# Patient Record
Sex: Female | Born: 1990 | Race: Black or African American | Hispanic: No | Marital: Single | Smoking: Former smoker
Health system: Southern US, Community
[De-identification: ages and names within clinical notes are randomized; demographics above are authoritative.]

## PROBLEM LIST (undated history)

## (undated) ENCOUNTER — Inpatient Hospital Stay: Payer: Self-pay

## (undated) DIAGNOSIS — Z3400 Encounter for supervision of normal first pregnancy, unspecified trimester: Secondary | ICD-10-CM

## (undated) DIAGNOSIS — D649 Anemia, unspecified: Secondary | ICD-10-CM

## (undated) DIAGNOSIS — J45909 Unspecified asthma, uncomplicated: Secondary | ICD-10-CM

## (undated) DIAGNOSIS — N6310 Unspecified lump in the right breast, unspecified quadrant: Secondary | ICD-10-CM

## (undated) HISTORY — PX: NO PAST SURGERIES: SHX2092

## (undated) HISTORY — PX: OTHER SURGICAL HISTORY: SHX169

## (undated) HISTORY — DX: Unspecified lump in the right breast, unspecified quadrant: N63.10

## (undated) HISTORY — DX: Anemia, unspecified: D64.9

---
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
## 2012-08-28 ENCOUNTER — Encounter (HOSPITAL_COMMUNITY): Payer: Self-pay | Admitting: Emergency Medicine

## 2012-08-28 ENCOUNTER — Emergency Department (HOSPITAL_COMMUNITY): Payer: No Typology Code available for payment source

## 2012-08-28 ENCOUNTER — Emergency Department (HOSPITAL_COMMUNITY)
Admission: EM | Admit: 2012-08-28 | Discharge: 2012-08-28 | Disposition: A | Discharge status: Home / Self Care | Payer: No Typology Code available for payment source | Attending: Emergency Medicine | Admitting: Emergency Medicine

## 2012-08-28 DIAGNOSIS — S41009A Unspecified open wound of unspecified shoulder, initial encounter: Secondary | ICD-10-CM | POA: Insufficient documentation

## 2012-08-28 DIAGNOSIS — S01411A Laceration without foreign body of right cheek and temporomandibular area, initial encounter: Secondary | ICD-10-CM

## 2012-08-28 DIAGNOSIS — S6990XA Unspecified injury of unspecified wrist, hand and finger(s), initial encounter: Secondary | ICD-10-CM | POA: Insufficient documentation

## 2012-08-28 DIAGNOSIS — S0990XA Unspecified injury of head, initial encounter: Secondary | ICD-10-CM | POA: Insufficient documentation

## 2012-08-28 DIAGNOSIS — J45909 Unspecified asthma, uncomplicated: Secondary | ICD-10-CM | POA: Insufficient documentation

## 2012-08-28 DIAGNOSIS — Y92410 Unspecified street and highway as the place of occurrence of the external cause: Secondary | ICD-10-CM | POA: Insufficient documentation

## 2012-08-28 DIAGNOSIS — S01409A Unspecified open wound of unspecified cheek and temporomandibular area, initial encounter: Secondary | ICD-10-CM | POA: Insufficient documentation

## 2012-08-28 DIAGNOSIS — Y9389 Activity, other specified: Secondary | ICD-10-CM | POA: Insufficient documentation

## 2012-08-28 DIAGNOSIS — S41112A Laceration without foreign body of left upper arm, initial encounter: Secondary | ICD-10-CM

## 2012-08-28 HISTORY — DX: Unspecified asthma, uncomplicated: J45.909

## 2012-08-28 LAB — URINALYSIS, ROUTINE W REFLEX MICROSCOPIC
Bilirubin Urine: NEGATIVE
Glucose, UA: NEGATIVE mg/dL
Hgb urine dipstick: NEGATIVE
Specific Gravity, Urine: 1.015 (ref 1.005–1.030)
Urobilinogen, UA: 0.2 mg/dL (ref 0.0–1.0)

## 2012-08-28 LAB — URINE MICROSCOPIC-ADD ON

## 2012-08-28 MED ORDER — LIDOCAINE-EPINEPHRINE-TETRACAINE (LET) SOLUTION
3.0000 mL | Freq: Once | NASAL | Status: AC
  Administered 2012-08-28: 3 mL via TOPICAL
  Filled 2012-08-28: qty 3

## 2012-08-28 MED ORDER — IBUPROFEN 800 MG PO TABS: 800.0000 mg | ORAL_TABLET | Freq: Three times a day (TID) | ORAL | Status: DC

## 2012-08-28 MED ORDER — IBUPROFEN 400 MG PO TABS
600.0000 mg | ORAL_TABLET | Freq: Once | ORAL | Status: AC
  Administered 2012-08-28: 600 mg via ORAL
  Filled 2012-08-28 (×2): qty 1
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
## 2012-08-29 NOTE — ED Provider Notes (Signed)
Medical screening examination/treatment/procedure(s) were performed by non-physician practitioner and as supervising physician I was immediately available for consultation/collaboration.  Aven Christen M Hershall Benkert, MD 08/29/12 0018 

## 2016-08-03 ENCOUNTER — Emergency Department
Admission: EM | Admit: 2016-08-03 | Discharge: 2016-08-03 | Disposition: A | Discharge status: Home / Self Care | Payer: Self-pay | Attending: Emergency Medicine | Admitting: Emergency Medicine

## 2016-08-03 ENCOUNTER — Encounter: Payer: Self-pay | Admitting: Emergency Medicine

## 2016-08-03 DIAGNOSIS — J020 Streptococcal pharyngitis: Secondary | ICD-10-CM | POA: Insufficient documentation

## 2016-08-03 DIAGNOSIS — J45909 Unspecified asthma, uncomplicated: Secondary | ICD-10-CM | POA: Insufficient documentation

## 2016-08-03 LAB — POCT RAPID STREP A: Streptococcus, Group A Screen (Direct): POSITIVE — AB

## 2016-08-03 MED ORDER — AMOXICILLIN 875 MG PO TABS: 875.0000 mg | ORAL_TABLET | Freq: Two times a day (BID) | ORAL | 0 refills | Status: DC

## 2016-08-03 NOTE — ED Provider Notes (Signed)
[facility] Emergency Department Provider Note ____________________________________________  Time seen: 1711  I have reviewed the triage vital signs and the nursing notes.  HISTORY  Chief Complaint  Sore Throat   HPI Crystal Randall is a 26 y.o. female is here with complaint of sore throat, body aches and headache for the last 2 days. Patient is unaware of any fever but has "felt warm". Patient denies any nausea, vomiting or diarrhea. She has not taken any over-the-counter medication for her symptoms. Patient continues to smoke cigarettes. Patient rates her pain as a 9/10.     Past Medical History:  Diagnosis Date  . Asthma     There are no active problems to display for this patient.   No past surgical history on file.  Prior to Admission medications   Medication Sig Start Date End Date Taking? Authorizing Provider  amoxicillin (AMOXIL) 875 MG tablet Take 1 tablet (875 mg total) by mouth 2 (two) times daily. 08/03/16   Johnn Hai, PA-C  medroxyPROGESTERone (DEPO-PROVERA) 150 MG/ML injection Inject 150 mg into the muscle every 3 (three) months.    [provider]    Allergies Patient has no known allergies.  No family history on file.  Social History Social History  Substance Use Topics  . Smoking status: Never Smoker  . Smokeless tobacco: Not on file  . Alcohol use No    Review of Systems  Constitutional: Subjective positive for fever. Eyes: Negative for visual changes. ENT: Positive for sore throat. Cardiovascular: Negative for chest pain. Respiratory: Negative for shortness of breath. Positive for cough. Musculoskeletal: Positive body aches. Skin: Negative for rash. Neurological: Negative for headaches, focal weakness or numbness. ____________________________________________  PHYSICAL EXAM:  VITAL SIGNS: ED Triage Vitals [08/03/16 1649]  Enc Vitals Group     BP 129/78     Pulse Rate 98     Resp 18     Temp  99.3 F (37.4 C)     Temp Source Oral     SpO2 100 %     Weight 150 lb (68 kg)     Height 5\' 2"  (1.575 m)     Head Circumference      Peak Flow      Pain Score 9     Pain Loc      Pain Edu?      Excl. in [place]?     Constitutional: Alert and oriented. Well appearing and in no distress. Head: Normocephalic and atraumatic. Eyes: Conjunctivae are normal. Ears: Canals clear. TMs intact bilaterally. Nose: Mild congestion/rhinorrhea/epistaxis. Mouth/Throat: Mucous membranes are moist. Bilateral tonsillar enlargement with cryptic tonsils. Questionable Exudative pockets present bilaterally. Neck: Supple.  Hematological/Lymphatic/Immunological: Minimal bilateral cervical lymphadenopathy. Cardiovascular: Normal rate, regular rhythm. Normal distal pulses. Respiratory: Normal respiratory effort. Occasional coarse cough. Musculoskeletal: Nontender with normal range of motion in all extremities.  Neurologic:  Normal speech and language. No gross focal neurologic deficits are appreciated. Skin:  Skin is warm, dry and intact. No rash noted. Psychiatric: Mood and affect are normal. Patient exhibits appropriate insight and judgment.   INITIAL IMPRESSION / ASSESSMENT AND PLAN / ED COURSE  The test was positive. Patient was made aware. She will take Tylenol or ibuprofen as needed for throat pain. She was given a prescription for amoxicillin 875 mg 1 twice a day for 10 days. She is aware that she is taking for the entire 10 days. She was given a coupon to obtain this medication for $9.  ____________________________________________  FINAL CLINICAL IMPRESSION(S) / ED DIAGNOSES  Final diagnoses:  Strep pharyngitis     Johnn Hai, PA-C 08/03/16 1740    Schuyler Amor, MD 08/03/16 2326

## 2016-08-03 NOTE — Discharge Instructions (Signed)
Today your strep test is positive. You are contagious for 24 hours. Take Tylenol or ibuprofen as needed for throat pain. Begin taking amoxicillin 875 mg 1 tablet twice a day for 10 days. A discount coupon was printed with the Price at [employer] being $9 for this prescription. You may take the prescription to any pharmacy that you wish

## 2016-08-03 NOTE — ED Notes (Signed)
See triage note  States she developed sore throat about 3 days ago   Pain increased today with swallowing  Throat and swollen   Low grade temp on arrival

## 2016-08-03 NOTE — ED Triage Notes (Signed)
Pt reports sore throat, body aches and headache for two days.

## 2016-09-10 ENCOUNTER — Encounter: Payer: Self-pay | Admitting: Emergency Medicine

## 2016-09-10 ENCOUNTER — Emergency Department
Admission: EM | Admit: 2016-09-10 | Discharge: 2016-09-10 | Disposition: A | Discharge status: Home / Self Care | Payer: No Typology Code available for payment source | Attending: Emergency Medicine | Admitting: Emergency Medicine

## 2016-09-10 DIAGNOSIS — Y9389 Activity, other specified: Secondary | ICD-10-CM | POA: Insufficient documentation

## 2016-09-10 DIAGNOSIS — S3992XA Unspecified injury of lower back, initial encounter: Secondary | ICD-10-CM | POA: Diagnosis present

## 2016-09-10 DIAGNOSIS — Y998 Other external cause status: Secondary | ICD-10-CM | POA: Insufficient documentation

## 2016-09-10 DIAGNOSIS — S39012A Strain of muscle, fascia and tendon of lower back, initial encounter: Secondary | ICD-10-CM | POA: Diagnosis not present

## 2016-09-10 DIAGNOSIS — Y92411 Interstate highway as the place of occurrence of the external cause: Secondary | ICD-10-CM | POA: Diagnosis not present

## 2016-09-10 DIAGNOSIS — Z79899 Other long term (current) drug therapy: Secondary | ICD-10-CM | POA: Diagnosis not present

## 2016-09-10 DIAGNOSIS — J45909 Unspecified asthma, uncomplicated: Secondary | ICD-10-CM | POA: Diagnosis not present

## 2016-09-10 MED ORDER — KETOROLAC TROMETHAMINE 10 MG PO TABS: 10.0000 mg | ORAL_TABLET | Freq: Four times a day (QID) | ORAL | 0 refills | Status: AC | PRN

## 2016-09-10 MED ORDER — KETOROLAC TROMETHAMINE 30 MG/ML IJ SOLN
30.0000 mg | Freq: Once | INTRAMUSCULAR | Status: AC
  Administered 2016-09-10: 30 mg via INTRAMUSCULAR
  Filled 2016-09-10: qty 1

## 2016-09-10 MED ORDER — CYCLOBENZAPRINE HCL 5 MG PO TABS: 5.0000 mg | ORAL_TABLET | Freq: Three times a day (TID) | ORAL | 0 refills | Status: DC | PRN
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
## 2017-01-08 ENCOUNTER — Emergency Department: Payer: Self-pay

## 2017-01-08 ENCOUNTER — Encounter: Payer: Self-pay | Admitting: Emergency Medicine

## 2017-01-08 ENCOUNTER — Emergency Department
Admission: EM | Admit: 2017-01-08 | Discharge: 2017-01-08 | Disposition: A | Discharge status: Home / Self Care | Payer: Self-pay | Attending: Emergency Medicine | Admitting: Emergency Medicine

## 2017-01-08 DIAGNOSIS — J45901 Unspecified asthma with (acute) exacerbation: Secondary | ICD-10-CM | POA: Insufficient documentation

## 2017-01-08 LAB — POCT PREGNANCY, URINE: Preg Test, Ur: NEGATIVE

## 2017-01-08 MED ORDER — ALBUTEROL SULFATE (2.5 MG/3ML) 0.083% IN NEBU
5.0000 mg | INHALATION_SOLUTION | Freq: Once | RESPIRATORY_TRACT | Status: AC
  Administered 2017-01-08: 5 mg via RESPIRATORY_TRACT
  Filled 2017-01-08: qty 6

## 2017-01-08 MED ORDER — ALBUTEROL SULFATE HFA 108 (90 BASE) MCG/ACT IN AERS: 2.0000 | INHALATION_SPRAY | Freq: Four times a day (QID) | RESPIRATORY_TRACT | 2 refills | Status: DC | PRN

## 2017-01-08 MED ORDER — IPRATROPIUM-ALBUTEROL 0.5-2.5 (3) MG/3ML IN SOLN
3.0000 mL | Freq: Once | RESPIRATORY_TRACT | Status: AC
  Administered 2017-01-08: 3 mL via RESPIRATORY_TRACT
  Filled 2017-01-08 (×2): qty 3

## 2017-01-08 NOTE — ED Provider Notes (Signed)
[facility] Emergency Department Provider Note   ____________________________________________    I have reviewed the triage vital signs and the nursing notes.   HISTORY  Chief Complaint Cough and Shortness of Breath     HPI Crystal Randall is a 26 y.o. female who presents with dry cough and mild shortness of breath. Patient has a history of asthma but has been out of her albuterol inhaler. Patient does admit to smoking. She denies fevers or chills. No recent travel. No calf pain or swelling.   Past Medical History:  Diagnosis Date  . Asthma     There are no active problems to display for this patient.   History reviewed. No pertinent surgical history.  Prior to Admission medications   Medication Sig Start Date End Date Taking? Authorizing Provider  albuterol (PROVENTIL HFA;VENTOLIN HFA) 108 (90 Base) MCG/ACT inhaler Inhale 2 puffs into the lungs every 6 (six) hours as needed for wheezing or shortness of breath. 01/08/17   Lavonia Drafts, MD  amoxicillin (AMOXIL) 875 MG tablet Take 1 tablet (875 mg total) by mouth 2 (two) times daily. 08/03/16   Johnn Hai, PA-C  cyclobenzaprine (FLEXERIL) 5 MG tablet Take 1 tablet (5 mg total) by mouth 3 (three) times daily as needed. 09/10/16   Little, Traci M, PA-C  medroxyPROGESTERone (DEPO-PROVERA) 150 MG/ML injection Inject 150 mg into the muscle every 3 (three) months.    [provider]     Allergies Patient has no known allergies.  No family history on file.  Social History Social History  Substance Use Topics  . Smoking status: Never Smoker  . Smokeless tobacco: Never Used  . Alcohol use No    Review of Systems  Constitutional: No fever/chills  ENT: No sore throat.    a. Musculoskeletal: Negative for back pain. Skin: Negative for rash. Neurological: Negative for headaches     ____________________________________________   PHYSICAL EXAM:  VITAL SIGNS: ED Triage  Vitals  Enc Vitals Group     BP 01/08/17 2109 (!) 132/95     Pulse Rate 01/08/17 2109 95     Resp 01/08/17 2109 20     Temp 01/08/17 2109 97.6 F (36.4 C)     Temp Source 01/08/17 2109 Oral     SpO2 01/08/17 2109 99 %     Weight 01/08/17 2110 69.9 kg (154 lb)     Height 01/08/17 2110 1.6 m (5\' 3" )     Head Circumference --      Peak Flow --      Pain Score 01/08/17 2109 8     Pain Loc --      Pain Edu? --      Excl. in [place]? --     Constitutional: Alert and oriented. No acute distress. Pleasant and interactive  Nose: No congestion/rhinnorhea. Mouth/Throat: Mucous membranes are moist.   Cardiovascular: Normal rate, regular rhythm.  Respiratory: Normal respiratory effort.  No retractions. Scattered mild wheezes Genitourinary: deferred Musculoskeletal: No lower extremity tenderness nor edema.   Neurologic:  Normal speech and language. No gross focal neurologic deficits are appreciated.   Skin:  Skin is warm, dry and intact. No rash noted.   ____________________________________________   LABS (all labs ordered are listed, but only abnormal results are displayed)  Labs Reviewed  POC URINE PREG, ED  POCT PREGNANCY, URINE   ____________________________________________  EKG   ____________________________________________  RADIOLOGY  Chest x-ray unremarkable ____________________________________________   PROCEDURES  Procedure(s) performed: No  Critical Care performed: No ____________________________________________   INITIAL IMPRESSION / ASSESSMENT AND PLAN / ED COURSE  Pertinent labs & imaging results that were available during my care of the patient were reviewed by me and considered in my medical decision making (see chart for details).  Patient with a history of asthma presents with mild shortness of breath, scattered wheezing.  Treated with DuoNeb 2 with significant improvement in symptoms. Chest x-ray unremarkable.  Counsel patient for greater than  3 minutes regarding smoking cessation   ____________________________________________   FINAL CLINICAL IMPRESSION(S) / ED DIAGNOSES  Final diagnoses:  Exacerbation of asthma, unspecified asthma severity, unspecified whether persistent      NEW MEDICATIONS STARTED DURING THIS VISIT:  New Prescriptions   ALBUTEROL (PROVENTIL HFA;VENTOLIN HFA) 108 (90 BASE) MCG/ACT INHALER    Inhale 2 puffs into the lungs every 6 (six) hours as needed for wheezing or shortness of breath.     Note:  This document was prepared using Dragon voice recognition software and may include unintentional dictation errors.    Lavonia Drafts, MD 01/08/17 2208

## 2017-01-08 NOTE — ED Triage Notes (Signed)
Pt reports chest pain since yesterday reports pain located in the middle of her chest feels pressure and increases trying to take a deep breath. Pt reports ran out of her asthma medication pt talks in complete sentences no distress noted

## 2017-06-03 ENCOUNTER — Encounter: Payer: Self-pay | Admitting: Medical Oncology

## 2017-06-03 ENCOUNTER — Emergency Department
Admission: EM | Admit: 2017-06-03 | Discharge: 2017-06-03 | Disposition: A | Discharge status: Home / Self Care | Payer: Self-pay | Attending: Student in an Organized Health Care Education/Training Program | Admitting: Student in an Organized Health Care Education/Training Program

## 2017-06-03 DIAGNOSIS — Z79899 Other long term (current) drug therapy: Secondary | ICD-10-CM | POA: Insufficient documentation

## 2017-06-03 DIAGNOSIS — J45909 Unspecified asthma, uncomplicated: Secondary | ICD-10-CM | POA: Insufficient documentation

## 2017-06-03 DIAGNOSIS — J020 Streptococcal pharyngitis: Secondary | ICD-10-CM | POA: Insufficient documentation

## 2017-06-03 LAB — GROUP A STREP BY PCR: GROUP A STREP BY PCR: DETECTED — AB

## 2017-06-03 MED ORDER — IBUPROFEN 800 MG PO TABS
ORAL_TABLET | ORAL | Status: AC
  Filled 2017-06-03: qty 1

## 2017-06-03 MED ORDER — ACETAMINOPHEN-CODEINE #3 300-30 MG PO TABS: 1.0000 | ORAL_TABLET | ORAL | 0 refills | Status: DC | PRN

## 2017-06-03 MED ORDER — AMOXICILLIN 500 MG PO TABS: 500.0000 mg | ORAL_TABLET | Freq: Three times a day (TID) | ORAL | 0 refills | Status: DC

## 2017-06-03 NOTE — ED Provider Notes (Signed)
[facility] Emergency Department Provider Note  ____________________________________________  Time seen: Approximately 11:29 PM  I have reviewed the triage vital signs and the nursing notes.   HISTORY  Chief Complaint Sore Throat; Generalized Body Aches; and Fever    HPI Crystal Randall is a 27 y.o. female who presents to the emergency department for treatment and evaluation of sore throat, fever, chills that began yesterday.  Sore throat is worse today.  No relief with Mucinex. Past Medical History:  Diagnosis Date  . Asthma     There are no active problems to display for this patient.   History reviewed. No pertinent surgical history.  Prior to Admission medications   Medication Sig Start Date End Date Taking? Authorizing Provider  acetaminophen-codeine (TYLENOL #3) 300-30 MG tablet Take 1 tablet by mouth every 4 (four) hours as needed for moderate pain. 06/03/17   Natoya Viscomi B, FNP  albuterol (PROVENTIL HFA;VENTOLIN HFA) 108 (90 Base) MCG/ACT inhaler Inhale 2 puffs into the lungs every 6 (six) hours as needed for wheezing or shortness of breath. 01/08/17   Lavonia Drafts, MD  amoxicillin (AMOXIL) 500 MG tablet Take 1 tablet (500 mg total) by mouth 3 (three) times daily. 06/03/17   Tashira Torre, Johnette Abraham B, FNP  cyclobenzaprine (FLEXERIL) 5 MG tablet Take 1 tablet (5 mg total) by mouth 3 (three) times daily as needed. 09/10/16   Little, Traci M, PA-C  medroxyPROGESTERone (DEPO-PROVERA) 150 MG/ML injection Inject 150 mg into the muscle every 3 (three) months.    [provider]    Allergies Patient has no known allergies.  No family history on file.  Social History Social History   Tobacco Use  . Smoking status: Never Smoker  . Smokeless tobacco: Never Used  Substance Use Topics  . Alcohol use: No  . Drug use: No    Review of Systems Constitutional: Positive for fever. Eyes: No visual changes. ENT: Positive for sore throat; negative for  difficulty swallowing. Respiratory: Denies shortness of breath. Gastrointestinal: No abdominal pain.  No nausea, no vomiting.  No diarrhea. Genitourinary: Negative for dysuria. Musculoskeletal: Negative for generalized body aches. Skin: Negative for rash. Neurological: Positive for headaches, negative focal weakness or numbness.  ____________________________________________   PHYSICAL EXAM:  VITAL SIGNS: ED Triage Vitals  Enc Vitals Group     BP 06/03/17 1802 121/74     Pulse Rate 06/03/17 1802 (!) 102     Resp 06/03/17 1802 18     Temp 06/03/17 1802 (!) 100.6 F (38.1 C)     Temp Source 06/03/17 1802 Oral     SpO2 06/03/17 1802 97 %     Weight 06/03/17 1803 150 lb (68 kg)     Height 06/03/17 1803 5\' 4"  (1.626 m)     Head Circumference --      Peak Flow --      Pain Score 06/03/17 1803 10     Pain Loc --      Pain Edu? --      Excl. in [place]? --    Constitutional: Alert and oriented. Well appearing and in no acute distress. Eyes: Conjunctivae are normal.  Head: Atraumatic. Nose: No congestion/rhinnorhea. Mouth/Throat: Mucous membranes are moist.  Oropharynx erythematous, tonsils 2+ with exudate. Uvula is midline. Neck: No stridor.  Lymphatic: Lymphadenopathy: Tender anterior cervical nodes Cardiovascular: Normal rate, regular rhythm. Good peripheral circulation. Respiratory: Normal respiratory effort. Lungs CTAB. Gastrointestinal: Soft and nontender. Musculoskeletal: No lower extremity tenderness nor edema.  Neurologic:  Normal  speech and language. No gross focal neurologic deficits are appreciated. Speech is normal. No gait instability. Skin:  Skin is warm, dry and intact. No rash noted Psychiatric: Mood and affect are normal. Speech and behavior are normal.  ____________________________________________   LABS (all labs ordered are listed, but only abnormal results are displayed)  Labs Reviewed  GROUP A STREP BY PCR - Abnormal; Notable for the following components:       Result Value   Group A Strep by PCR DETECTED (*)    All other components within normal limits   ____________________________________________  EKG  Not indicated ____________________________________________  RADIOLOGY  Not indicated ____________________________________________   PROCEDURES  Procedure(s) performed: None  Critical Care performed: No ____________________________________________   INITIAL IMPRESSION / ASSESSMENT AND PLAN / ED COURSE  27 year old female presenting to the emergency department for treatment and evaluation of sore throat.  Strep test was positive.  She will be started on amoxicillin she will be given some Tylenol 3 as well.  She was instructed to follow-up with the primary care provider for symptoms that are not improving over the next few days.  She was instructed to return to the emergency department for symptoms of change or worsen if she is unable to schedule an appointment.  Pertinent labs & imaging results that were available during my care of the patient were reviewed by me and considered in my medical decision making (see chart for details). ____________________________________________  Discharge Medication List as of 06/03/2017  7:00 PM    START taking these medications   Details  acetaminophen-codeine (TYLENOL #3) 300-30 MG tablet Take 1 tablet by mouth every 4 (four) hours as needed for moderate pain., Starting Sun 06/03/2017, Print        FINAL CLINICAL IMPRESSION(S) / ED DIAGNOSES  Final diagnoses:  Strep throat    If controlled substance prescribed during this visit, 12 month history viewed on the [place] prior to issuing an initial prescription for Schedule II or III opiod.   Note:  This document was prepared using Dragon voice recognition software and may include unintentional dictation errors.    Victorino Dike, FNP 06/03/17 2331    Merlyn Lot, MD 06/04/17 [id number]

## 2017-06-03 NOTE — ED Triage Notes (Signed)
Pt reports sore throat, fever, chills that began yesterday.

## 2017-10-26 ENCOUNTER — Other Ambulatory Visit (HOSPITAL_COMMUNITY)
Admission: RE | Admit: 2017-10-26 | Discharge: 2017-10-26 | Disposition: A | Discharge status: Home / Self Care | Payer: Self-pay | Source: Ambulatory Visit | Attending: Advanced Practice Midwife | Admitting: Advanced Practice Midwife

## 2017-10-26 ENCOUNTER — Encounter: Payer: Self-pay | Admitting: Advanced Practice Midwife

## 2017-10-26 ENCOUNTER — Ambulatory Visit (INDEPENDENT_AMBULATORY_CARE_PROVIDER_SITE_OTHER): Payer: Medicaid Other | Admitting: Advanced Practice Midwife

## 2017-10-26 VITALS — BP 108/76 | HR 83 | Wt 150.0 lb

## 2017-10-26 DIAGNOSIS — Z3400 Encounter for supervision of normal first pregnancy, unspecified trimester: Secondary | ICD-10-CM | POA: Insufficient documentation

## 2017-10-26 DIAGNOSIS — Z3A08 8 weeks gestation of pregnancy: Secondary | ICD-10-CM | POA: Diagnosis not present

## 2017-10-26 DIAGNOSIS — Z113 Encounter for screening for infections with a predominantly sexual mode of transmission: Secondary | ICD-10-CM

## 2017-10-26 DIAGNOSIS — Z1379 Encounter for other screening for genetic and chromosomal anomalies: Secondary | ICD-10-CM

## 2017-10-26 DIAGNOSIS — Z3401 Encounter for supervision of normal first pregnancy, first trimester: Secondary | ICD-10-CM

## 2017-10-26 HISTORY — DX: Encounter for supervision of normal first pregnancy, unspecified trimester: Z34.00

## 2017-10-26 LAB — OB RESULTS CONSOLE VARICELLA ZOSTER ANTIBODY, IGG: VARICELLA IGG: IMMUNE

## 2017-10-26 NOTE — Progress Notes (Signed)
New Obstetric Patient H&P    Chief Complaint: "Desires prenatal care"   History of Present Illness: Patient is a 27 y.o. G1P0 Not Hispanic or Latino female, presents with amenorrhea and positive home pregnancy test. Patient's last menstrual period was 08/31/2017 (exact date). and based on her  LMP, her EDD is Estimated Date of Delivery: 06/07/18 and her EGA is [redacted]w[redacted]d. Cycles are 6. days, regular, and occur approximately every : 28 days. Her last pap smear was 1 or 2 years ago and was no abnormalities.    She had a urine pregnancy test which was positive 2 week(s)  ago. Her last menstrual period was normal and lasted for  5 or 6 day(s). Since her LMP she claims she has experienced breast tenderness, fatigue, nausea. She denies vaginal bleeding. Her past medical history is contributory for asthma. This is her first pregnancy.  Since her LMP, she admits to the use of tobacco products  She smokes 6-7 cigarettes per day and is trying to quit She claims she has gained   3 pounds since the start of her pregnancy.  There are cats in the home in the home  no  She admits close contact with children on a regular basis  yes  She has had chicken pox in the past yes She has had Tuberculosis exposures, symptoms, or previously tested positive for TB   no Current or past history of domestic violence. no  Genetic Screening/Teratology Counseling: (Includes patient, baby's father, or anyone in either family with:)   27. Patient's age >/= 27 [facility]  no 2. Thalassemia ([place], [place], [place], or Asian background): MCV<80  no 3. Neural tube defect (meningomyelocele, spina bifida, anencephaly)  no 4. Congenital heart defect  no  5. Down syndrome  no 6. Tay-Sachs (Jewish, [place])  no 7. Canavan's Disease  no 8. Sickle cell disease or trait (African)  no  9. Hemophilia or other blood disorders  no  10. Muscular dystrophy  no  11. Cystic fibrosis  no  12. Huntington's Chorea  no  13. Mental  retardation/autism  no 14. Other inherited genetic or chromosomal disorder  no 15. Maternal metabolic disorder (DM, PKU, etc)  no 16. Patient or FOB with a child with a birth defect not listed above no  16a. Patient or FOB with a birth defect themselves no 17. Recurrent pregnancy loss, or stillbirth  no  18. Any medications since LMP other than prenatal vitamins (include vitamins, supplements, OTC meds, drugs, alcohol)  no 19. Any other genetic/environmental exposure to discuss  no  Infection History:   1. Lives with someone with TB or TB exposed  no  2. Patient or partner has history of genital herpes  no 3. Rash or viral illness since LMP  no 4. History of STI (GC, CT, HPV, syphilis, HIV)  no 5. History of recent travel :  no  Other pertinent information:  no    Review of Systems:10 point review of systems negative unless otherwise noted in HPI  Past Medical History:  Past Medical History:  Diagnosis Date  . Asthma     Past Surgical History:  History reviewed. No pertinent surgical history.  Gynecologic History: Patient's last menstrual period was 08/31/2017 (exact date).  Obstetric History: G1P0  Family History:  History reviewed. No pertinent family history.  Maternal Grandmother Breast Cancer diagnosed age 65  Social History:  Social History   Socioeconomic History  . Marital status: Single    Spouse name: Not  on file  . Number of children: Not on file  . Years of education: Not on file  . Highest education level: Not on file  Occupational History  . Not on file  Social Needs  . Financial resource strain: Not on file  . Food insecurity:    Worry: Not on file    Inability: Not on file  . Transportation needs:    Medical: Not on file    Non-medical: Not on file  Tobacco Use  . Smoking status: Current Every Day Smoker    Packs/day: 0.35    Years: 2.00    Pack years: 0.70    Types: Cigarettes  . Smokeless tobacco: Never Used  Substance and Sexual  Activity  . Alcohol use: No  . Drug use: No  . Sexual activity: Not on file  Lifestyle  . Physical activity:    Days per week: Not on file    Minutes per session: Not on file  . Stress: Not on file  Relationships  . Social connections:    Talks on phone: Not on file    Gets together: Not on file    Attends religious service: Not on file    Active member of club or organization: Not on file    Attends meetings of clubs or organizations: Not on file    Relationship status: Not on file  . Intimate partner violence:    Fear of current or ex partner: Not on file    Emotionally abused: Not on file    Physically abused: Not on file    Forced sexual activity: Not on file  Other Topics Concern  . Not on file  Social History Narrative  . Not on file    Allergies:  No Known Allergies  Medications: Prior to Admission medications   Medication Sig Start Date End Date Taking? Authorizing Provider  Prenatal Vit-Fe Fumarate-FA (PRENATAL PO) Take by mouth.   Yes [provider]    Physical Exam Vitals: Blood pressure 108/76, pulse 83, weight 150 lb (68 kg), last menstrual period 08/31/2017.  General: NAD HEENT: normocephalic, anicteric Thyroid: no enlargement, no palpable nodules Pulmonary: No increased work of breathing, CTAB Cardiovascular: RRR, distal pulses 2+ Abdomen: NABS, soft, non-tender, non-distended.  Umbilicus without lesions.  No hepatomegaly, splenomegaly or masses palpable. No evidence of hernia  Genitourinary: deferred for no concerns/PAP interval/dating scan in 1 week/shared decision making Extremities: no edema, erythema, or tenderness Neurologic: Grossly intact Psychiatric: mood appropriate, affect full   Assessment: 27 y.o. G1P0 at [redacted]w[redacted]d presenting to initiate prenatal care  Plan: 1) Avoid alcoholic beverages. 2) Patient encouraged not to smoke.  3) Discontinue the use of all non-medicinal drugs and chemicals.  4) Take prenatal vitamins daily.  5)  Nutrition, food safety (fish, cheese advisories, and high nitrite foods) and exercise discussed. 6) Hospital and practice style discussed with cross coverage system.  7) Genetic Screening, such as with 1st Trimester Screening, cell free fetal DNA, AFP testing, and Ultrasound, as well as with amniocentesis and CVS as appropriate, is discussed with patient. At the conclusion of today's visit patient requested cell free DNA genetic testing 8) Patient is asked about travel to areas at risk for the Zika virus, and counseled to avoid travel and exposure to mosquitoes or sexual partners who may have themselves been exposed to the virus. Testing is discussed, and will be ordered as appropriate.  9) Dating scan in 1 week 10) NOB labs today   Rod Can, CNM [facility] 10/26/2017, 4:02 PM

## 2017-10-26 NOTE — Patient Instructions (Signed)
Exercise During Pregnancy For people of all ages, exercise is an important part of being healthy. Exercise improves heart and lung function and helps to maintain strength, flexibility, and a healthy body weight. Exercise also boosts energy levels and elevates mood. For most women, maintaining an exercise routine throughout pregnancy is recommended. It is only on rare occasions and with certain medical conditions or pregnancy complications that women may be asked to limit or avoid exercise during pregnancy. What are some other benefits to exercising during pregnancy? Along with maintaining strength and flexibility, exercising throughout pregnancy can help to:  Keep strength in muscles that are very important during labor and childbirth.  Decrease low back pain during pregnancy.  Decrease the risk of developing gestational diabetes mellitus (GDM).  Improve blood sugar (glucose) control for women who have GDM.  Decrease the risk of developing preeclampsia. This is a serious condition that causes high blood pressure along with other symptoms, such as swelling and headaches.  Decrease the risk of cesarean delivery.  Speed up the recovery after giving birth.  How often should I exercise? Unless your health care provider gives you different instructions, you should try to exercise on most days or all days of the week. In general, try to exercise with moderate intensity for about 150 minutes per week. This can be spread out across several days, such as exercising for 30 minutes per day on 5 days of each week. You can tell that you are exercising at a moderate intensity if you have a higher heart rate and faster breathing, but you are still able to hold a conversation. What types of moderate-intensity exercise are recommended during pregnancy? There are many types of exercise that are safe for you to do during pregnancy. Unless your health care provider gives you different instructions, do a variety of  exercises that safely increase your heart and breathing (cardiopulmonary) rates and help you to build and maintain muscle strength (strength training). You should always be able to talk in full sentences while exercising during pregnancy. Some examples of exercising that is safe to do during pregnancy include:  Brisk walking or hiking.  Swimming.  Water aerobics.  Riding a stationary bike.  Strength training.  Modified yoga or Pilates. Tell your instructor that you are pregnant. Avoid overstretching and avoid lying on your back for long periods of time.  Running or jogging. Only choose this type of exercise if: ? You ran or jogged regularly before your pregnancy. ? You can run or jog and still talk in complete sentences.  What types of exercise should I not do during pregnancy? Depending on your level of fitness and whether you exercised regularly before your pregnancy, you may be advised to limit vigorous-intensity exercise during your pregnancy. You can tell that you are exercising at a vigorous intensity if you are breathing much harder and faster and cannot hold a conversation while exercising. Some examples of exercising that you should avoid during pregnancy include:  Contact sports.  Activities that place you at risk for falling on or being hit in the belly, such as downhill skiing, water skiing, surfing, rock climbing, cycling, gymnastics, and horseback riding.  Scuba diving.  Sky diving.  Yoga or Pilates in a room that is heated to extreme temperatures ("hot yoga" or "hot Pilates").  Jogging or running, unless you ran or jogged regularly before your pregnancy. While jogging or running, you should always be able to talk in full sentences. Do not run or jog so vigorously  that you are unable to have a conversation.  If you are not used to exercising at elevation (more than 6,000 feet above sea level), do not do so during your pregnancy.  When should I avoid exercising  during pregnancy? Certain medical conditions can make it unsafe to exercise during pregnancy, or they may increase your risk of miscarriage or early labor and birth. Some of these conditions include:  Some types of heart disease.  Some types of lung disease.  Placenta previa. This is when the placenta partially or completely covers the opening of the uterus (cervix).  Frequent bleeding from the vagina during your pregnancy.  Incompetent cervix. This is when your cervix does not remain as tightly closed during pregnancy as it should.  Premature labor.  Ruptured membranes. This is when the protective sac (amniotic sac) opens up and amniotic fluid leaks from your vagina.  Severely low blood count (anemia).  Preeclampsia or pregnancy-caused high blood pressure.  Carrying more than one baby (multiple gestation) and having an additional risk of early labor.  Poorly controlled diabetes.  Being severely underweight or severely overweight.  Intrauterine growth restriction. This is when your baby's growth and development during pregnancy are slower than expected.  Other medical conditions. Ask your health care provider if any apply to you.  What else should I know about exercising during pregnancy? You should take these precautions while exercising during pregnancy:  Avoid overheating. ? Wear loose-fitting, breathable clothes. ? Do not exercise in very high temperatures.  Avoid dehydration. Drink enough water before, during, and after exercise to keep your urine clear or pale yellow.  Avoid overstretching. Because of hormone changes during pregnancy, it is easy to overstretch muscles, tendons, and ligaments during pregnancy.  Start slowly and ask your health care provider to recommend types of exercise that are safe for you, if exercising regularly is new for you.  Pregnancy is not a time for exercising to lose weight. When should I seek medical care? You should stop exercising  and call your health care provider if you have any unusual symptoms, such as:  Mild uterine contractions or abdominal cramping.  Dizziness that does not improve with rest.  When should I seek immediate medical care? You should stop exercising and call your local emergency services (911 in the [place] if you have any unusual symptoms, such as:  Sudden, severe pain in your low back or your belly.  Uterine contractions or abdominal cramping that do not improve with rest.  Chest pain.  Bleeding or fluid leaking from your vagina.  Shortness of breath.  This information is not intended to replace advice given to you by your health care provider. Make sure you discuss any questions you have with your health care provider. Document Released: 03/06/2005 Document Revised: 08/04/2015 Document Reviewed: 05/14/2014 Elsevier Interactive Patient Education  2018 [place] for Pregnant Women While you are pregnant, your body will require additional nutrition to help support your growing baby. It is recommended that you consume:  150 additional calories each day during your first trimester.  300 additional calories each day during your second trimester.  300 additional calories each day during your third trimester.  Eating a healthy, well-balanced diet is very important for your health and for your baby's health. You also have a higher need for some vitamins and minerals, such as folic acid, calcium, iron, and vitamin D. What do I need to know about eating during pregnancy?  Do not try to lose weight  or go on a diet during pregnancy.  Choose healthy, nutritious foods. Choose  of a sandwich with a glass of milk instead of a candy bar or a high-calorie sugar-sweetened beverage.  Limit your overall intake of foods that have "empty calories." These are foods that have little nutritional value, such as sweets, desserts, candies, sugar-sweetened beverages, and fried foods.  Eat a  variety of foods, especially fruits and vegetables.  Take a prenatal vitamin to help meet the additional needs during pregnancy, specifically for folic acid, iron, calcium, and vitamin D.  Remember to stay active. Ask your health care provider for exercise recommendations that are specific to you.  Practice good food safety and cleanliness, such as washing your hands before you eat and after you prepare raw meat. This helps to prevent foodborne illnesses, such as listeriosis, that can be very dangerous for your baby. Ask your health care provider for more information about listeriosis. What does 150 extra calories look like? Healthy options for an additional 150 calories each day could be any of the following:  Plain low-fat yogurt (6-8 oz) with  cup of berries.  1 apple with 2 teaspoons of peanut butter.  Cut-up vegetables with  cup of hummus.  Low-fat chocolate milk (8 oz or 1 cup).  1 string cheese with 1 medium orange.   of a peanut butter and jelly sandwich on whole-wheat bread (1 tsp of peanut butter).  For 300 calories, you could eat two of those healthy options each day. What is a healthy amount of weight to gain? The recommended amount of weight for you to gain is based on your pre-pregnancy BMI. If your pre-pregnancy BMI was:  Less than 18 (underweight), you should gain 28-40 lb.  18-24.9 (normal), you should gain 25-35 lb.  25-29.9 (overweight), you should gain 15-25 lb.  Greater than 30 (obese), you should gain 11-20 lb.  What if I am having twins or multiples? Generally, pregnant women who will be having twins or multiples may need to increase their daily calories by 300-600 calories each day. The recommended range for total weight gain is 25-54 lb, depending on your pre-pregnancy BMI. Talk with your health care provider for specific guidance about additional nutritional needs, weight gain, and exercise during your pregnancy. What foods can I eat? Grains Any  grains. Try to choose whole grains, such as whole-wheat bread, oatmeal, or brown rice. Vegetables Any vegetables. Try to eat a variety of colors and types of vegetables to get a full range of vitamins and minerals. Remember to wash your vegetables well before eating. Fruits Any fruits. Try to eat a variety of colors and types of fruit to get a full range of vitamins and minerals. Remember to wash your fruits well before eating. Meats and Other Protein Sources Lean meats, including chicken, [place], fish, and lean cuts of beef, veal, or pork. Make sure that all meats are cooked to "well done." Tofu. Tempeh. Beans. Eggs. Peanut butter and other nut butters. Seafood, such as shrimp, crab, and lobster. If you choose fish, select types that are higher in omega-3 fatty acids, including salmon, herring, mussels, trout, sardines, and pollock. Make sure that all meats are cooked to food-safe temperatures. Dairy Pasteurized milk and milk alternatives. Pasteurized yogurt and pasteurized cheese. Cottage cheese. Sour cream. Beverages Water. Juices that contain 100% fruit juice or vegetable juice. Caffeine-free teas and decaffeinated coffee. Drinks that contain caffeine are okay to drink, but it is better to avoid caffeine. Keep your total caffeine  intake to less than 200 mg each day (12 oz of coffee, tea, or soda) or as directed by your health care provider. Condiments Any pasteurized condiments. Sweets and Desserts Any sweets and desserts. Fats and Oils Any fats and oils. The items listed above may not be a complete list of recommended foods or beverages. Contact your dietitian for more options. What foods are not recommended? Vegetables Unpasteurized (raw) vegetable juices. Fruits Unpasteurized (raw) fruit juices. Meats and Other Protein Sources Cured meats that have nitrates, such as bacon, salami, and hotdogs. Luncheon meats, bologna, or other deli meats (unless they are reheated until they are  steaming hot). Refrigerated pate, meat spreads from a meat counter, smoked seafood that is found in the refrigerated section of a store. Raw fish, such as sushi or sashimi. High mercury content fish, such as tilefish, shark, swordfish, and king mackerel. Raw meats, such as tuna or beef tartare. Undercooked meats and poultry. Make sure that all meats are cooked to food-safe temperatures. Dairy Unpasteurized (raw) milk and any foods that have raw milk in them. Soft cheeses, such as feta, queso blanco, queso fresco, Brie, Camembert cheeses, blue-veined cheeses, and Panela cheese (unless it is made with pasteurized milk, which must be stated on the label). Beverages Alcohol. Sugar-sweetened beverages, such as sodas, teas, or energy drinks. Condiments Homemade fermented foods and drinks, such as pickles, sauerkraut, or kombucha drinks. (Store-bought pasteurized versions of these are okay.) Other Salads that are made in the store, such as ham salad, chicken salad, egg salad, tuna salad, and seafood salad. The items listed above may not be a complete list of foods and beverages to avoid. Contact your dietitian for more information. This information is not intended to replace advice given to you by your health care provider. Make sure you discuss any questions you have with your health care provider. Document Released: 12/19/2013 Document Revised: 08/12/2015 Document Reviewed: 08/19/2013 Elsevier Interactive Patient Education  2018 [employer]. Prenatal Care WHAT IS PRENATAL CARE? Prenatal care is the process of caring for a pregnant woman before she gives birth. Prenatal care makes sure that she and her baby remain as healthy as possible throughout pregnancy. Prenatal care may be provided by a midwife, family practice health care provider, or a childbirth and pregnancy specialist (obstetrician). Prenatal care may include physical examinations, testing, treatments, and education on nutrition, lifestyle, and  social support services. WHY IS PRENATAL CARE SO IMPORTANT? Early and consistent prenatal care increases the chance that you and your baby will remain healthy throughout your pregnancy. This type of care also decreases a baby's risk of being born too early (prematurely), or being born smaller than expected (small for gestational age). Any underlying medical conditions you may have that could pose a risk during your pregnancy are discussed during prenatal care visits. You will also be monitored regularly for any new conditions that may arise during your pregnancy so they can be treated quickly and effectively. WHAT HAPPENS DURING PRENATAL CARE VISITS? Prenatal care visits may include the following: Discussion Tell your health care provider about any new signs or symptoms you have experienced since your last visit. These might include:  Nausea or vomiting.  Increased or decreased level of energy.  Difficulty sleeping.  Back or leg pain.  Weight changes.  Frequent urination.  Shortness of breath with physical activity.  Changes in your skin, such as the development of a rash or itchiness.  Vaginal discharge or bleeding.  Feelings of excitement or nervousness.  Changes in  your baby's movements.  You may want to write down any questions or topics you want to discuss with your health care provider and bring them with you to your appointment. Examination During your first prenatal care visit, you will likely have a complete physical exam. Your health care provider will often examine your vagina, cervix, and the position of your uterus, as well as check your heart, lungs, and other body systems. As your pregnancy progresses, your health care provider will measure the size of your uterus and your baby's position inside your uterus. He or she may also examine you for early signs of labor. Your prenatal visits may also include checking your blood pressure and, after about 10-12 weeks of  pregnancy, listening to your baby's heartbeat. Testing Regular testing often includes:  Urinalysis. This checks your urine for glucose, protein, or signs of infection.  Blood count. This checks the levels of white and red blood cells in your body.  Tests for sexually transmitted infections (STIs). Testing for STIs at the beginning of pregnancy is routinely done and is required in many states.  Antibody testing. You will be checked to see if you are immune to certain illnesses, such as rubella, that can affect a developing fetus.  Glucose screen. Around 24-28 weeks of pregnancy, your blood glucose level will be checked for signs of gestational diabetes. Follow-up tests may be recommended.  Group B strep. This is a bacteria that is commonly found inside a woman's vagina. This test will inform your health care provider if you need an antibiotic to reduce the amount of this bacteria in your body prior to labor and childbirth.  Ultrasound. Many pregnant women undergo an ultrasound screening around 18-20 weeks of pregnancy to evaluate the health of the fetus and check for any developmental abnormalities.  HIV (human immunodeficiency virus) testing. Early in your pregnancy, you will be screened for HIV. If you are at high risk for HIV, this test may be repeated during your third trimester of pregnancy.  You may be offered other testing based on your age, personal or family medical history, or other factors. HOW OFTEN SHOULD I PLAN TO SEE MY HEALTH CARE PROVIDER FOR PRENATAL CARE? Your prenatal care check-up schedule depends on any medical conditions you have before, or develop during, your pregnancy. If you do not have any underlying medical conditions, you will likely be seen for checkups:  Monthly, during the first 6 months of pregnancy.  Twice a month during months 7 and 8 of pregnancy.  Weekly starting in the 9th month of pregnancy and until delivery.  If you develop signs of early labor  or other concerning signs or symptoms, you may need to see your health care provider more often. Ask your health care provider what prenatal care schedule is best for you. WHAT CAN I DO TO KEEP MYSELF AND MY BABY AS HEALTHY AS POSSIBLE DURING MY PREGNANCY?  Take a prenatal vitamin containing 400 micrograms (0.4 mg) of folic acid every day. Your health care provider may also ask you to take additional vitamins such as iodine, vitamin D, iron, copper, and zinc.  Take 1500-2000 mg of calcium daily starting at your 20th week of pregnancy until you deliver your baby.  Make sure you are up to date on your vaccinations. Unless directed otherwise by your health care provider: ? You should receive a tetanus, diphtheria, and pertussis (Tdap) vaccination between the 27th and 36th week of your pregnancy, regardless of when your last Tdap immunization  occurred. This helps protect your baby from whooping cough (pertussis) after he or she is born. ? You should receive an annual inactivated influenza vaccine (IIV) to help protect you and your baby from influenza. This can be done at any point during your pregnancy.  Eat a well-rounded diet that includes: ? Fresh fruits and vegetables. ? Lean proteins. ? Calcium-rich foods such as milk, yogurt, hard cheeses, and dark, leafy greens. ? Whole grain breads.  Do noteat seafood high in mercury, including: ? Swordfish. ? Tilefish. ? Shark. ? King mackerel. ? More than 6 oz tuna per week.  Do not eat: ? Raw or undercooked meats or eggs. ? Unpasteurized foods, such as soft cheeses (brie, blue, or feta), juices, and milks. ? Lunch meats. ? Hot dogs that have not been heated until they are steaming.  Drink enough water to keep your urine clear or pale yellow. For many women, this may be 10 or more 8 oz glasses of water each day. Keeping yourself hydrated helps deliver nutrients to your baby and may prevent the start of pre-term uterine contractions.  Do not use  any tobacco products including cigarettes, chewing tobacco, or electronic cigarettes. If you need help quitting, ask your health care provider.  Do not drink beverages containing alcohol. No safe level of alcohol consumption during pregnancy has been determined.  Do not use any illegal drugs. These can harm your developing baby or cause a miscarriage.  Ask your health care provider or pharmacist before taking any prescription or over-the-counter medicines, herbs, or supplements.  Limit your caffeine intake to no more than 200 mg per day.  Exercise. Unless told otherwise by your health care provider, try to get 30 minutes of moderate exercise most days of the week. Do not  do high-impact activities, contact sports, or activities with a high risk of falling, such as horseback riding or downhill skiing.  Get plenty of rest.  Avoid anything that raises your body temperature, such as hot tubs and saunas.  If you own a cat, do not empty its litter box. Bacteria contained in cat feces can cause an infection called toxoplasmosis. This can result in serious harm to the fetus.  Stay away from chemicals such as insecticides, lead, mercury, and cleaning or paint products that contain solvents.  Do not have any X-rays taken unless medically necessary.  Take a childbirth and breastfeeding preparation class. Ask your health care provider if you need a referral or recommendation.  This information is not intended to replace advice given to you by your health care provider. Make sure you discuss any questions you have with your health care provider. Document Released: 03/09/2003 Document Revised: 08/09/2015 Document Reviewed: 05/21/2013 Elsevier Interactive Patient Education  2017 [employer].

## 2017-10-28 LAB — URINE CULTURE

## 2017-10-28 LAB — URINE DRUG PANEL 7
AMPHETAMINES, URINE: NEGATIVE ng/mL
Barbiturate Quant, Ur: NEGATIVE ng/mL
Benzodiazepine Quant, Ur: NEGATIVE ng/mL
COCAINE (METAB.): NEGATIVE ng/mL
Cannabinoid Quant, Ur: NEGATIVE ng/mL
Opiate Quant, Ur: NEGATIVE ng/mL
PCP QUANT UR: NEGATIVE ng/mL

## 2017-10-29 LAB — HEMOGLOBINOPATHY EVALUATION
HGB C: 0 %
HGB S: 0 %
HGB VARIANT: 0 %
Hemoglobin A2 Quantitation: 2.6 % (ref 1.8–3.2)
Hemoglobin F Quantitation: 0 % (ref 0.0–2.0)
Hgb A: 97.4 % (ref 96.4–98.8)

## 2017-10-29 LAB — RPR+RH+ABO+RUB AB+AB SCR+CB...
ANTIBODY SCREEN: NEGATIVE
HEMATOCRIT: 35.1 % (ref 34.0–46.6)
HIV Screen 4th Generation wRfx: NONREACTIVE
Hemoglobin: 11.2 g/dL (ref 11.1–15.9)
Hepatitis B Surface Ag: NEGATIVE
MCH: 23.2 pg — ABNORMAL LOW (ref 26.6–33.0)
MCHC: 31.9 g/dL (ref 31.5–35.7)
MCV: 73 fL — AB (ref 79–97)
PLATELETS: 354 10*3/uL (ref 150–450)
RBC: 4.83 x10E6/uL (ref 3.77–5.28)
RDW: 15.8 % — AB (ref 12.3–15.4)
RH TYPE: POSITIVE
RPR Ser Ql: NONREACTIVE
Rubella Antibodies, IGG: <0.9 index — ABNORMAL LOW (ref >0.99)
Varicella zoster IgG: 477 index (ref >165)
WBC: 8.6 10*3/uL (ref 3.4–10.8)

## 2017-10-30 LAB — CERVICOVAGINAL ANCILLARY ONLY
CHLAMYDIA, DNA PROBE: NEGATIVE
Neisseria Gonorrhea: NEGATIVE
Trichomonas: NEGATIVE

## 2017-11-05 ENCOUNTER — Ambulatory Visit (INDEPENDENT_AMBULATORY_CARE_PROVIDER_SITE_OTHER): Payer: Medicaid Other

## 2017-11-05 ENCOUNTER — Ambulatory Visit (INDEPENDENT_AMBULATORY_CARE_PROVIDER_SITE_OTHER): Payer: Medicaid Other | Admitting: Obstetrics & Gynecology

## 2017-11-05 VITALS — BP 100/60 | Wt 149.0 lb

## 2017-11-05 DIAGNOSIS — Z3400 Encounter for supervision of normal first pregnancy, unspecified trimester: Secondary | ICD-10-CM

## 2017-11-05 DIAGNOSIS — O3411 Maternal care for benign tumor of corpus uteri, first trimester: Secondary | ICD-10-CM

## 2017-11-05 DIAGNOSIS — N8311 Corpus luteum cyst of right ovary: Secondary | ICD-10-CM | POA: Diagnosis not present

## 2017-11-05 DIAGNOSIS — Z3A09 9 weeks gestation of pregnancy: Secondary | ICD-10-CM

## 2017-11-05 DIAGNOSIS — Z3401 Encounter for supervision of normal first pregnancy, first trimester: Secondary | ICD-10-CM

## 2017-11-05 MED ORDER — ONDANSETRON 4 MG PO TBDP: 4.0000 mg | ORAL_TABLET | Freq: Four times a day (QID) | ORAL | 0 refills | Status: DC | PRN

## 2017-11-05 NOTE — Progress Notes (Signed)
  Subjective  Fetal Movement? no Contractions? no Leaking Fluid? no Vaginal Bleeding? no  Objective  BP 100/60   Wt 149 lb (67.6 kg)   LMP 08/31/2017 (Exact Date)   BMI 25.58 kg/m  General: NAD Pumonary: no increased work of breathing Abdomen: gravid, non-tender Extremities: no edema Psychiatric: mood appropriate, affect full  Assessment  27 y.o. G1P0 at [redacted]w[redacted]d by  06/07/2018, by Last Menstrual Period presenting for routine prenatal visit  Plan   Problem List Items Addressed This Visit      Other   Supervision of normal first pregnancy, antepartum    Other Visit Diagnoses    [redacted] weeks gestation of pregnancy    -  Primary   Relevant Orders   MaterniT21 PLUS Core+SCA    Review of ULTRASOUND.    I have personally reviewed images and report of recent ultrasound done at [facility].    Plan of management to be discussed with patient. Zofran for nausea  Barnett Applebaum, MD, [facility], [facility] 11/05/2017  3:10 PM

## 2017-11-05 NOTE — Patient Instructions (Signed)

## 2017-11-10 LAB — MATERNIT21 PLUS CORE+SCA
Chromosome 13: NEGATIVE
Chromosome 18: NEGATIVE
Chromosome 21: NEGATIVE
Y Chromosome: NOT DETECTED

## 2017-11-28 ENCOUNTER — Ambulatory Visit (INDEPENDENT_AMBULATORY_CARE_PROVIDER_SITE_OTHER): Payer: Medicaid Other | Admitting: Maternal Newborn

## 2017-11-28 ENCOUNTER — Telehealth: Payer: Self-pay

## 2017-11-28 ENCOUNTER — Other Ambulatory Visit (HOSPITAL_COMMUNITY)
Admission: RE | Admit: 2017-11-28 | Discharge: 2017-11-28 | Disposition: A | Discharge status: Home / Self Care | Payer: Medicaid Other | Source: Ambulatory Visit | Attending: Maternal Newborn | Admitting: Maternal Newborn

## 2017-11-28 VITALS — BP 106/50 | Wt 147.0 lb

## 2017-11-28 DIAGNOSIS — O26891 Other specified pregnancy related conditions, first trimester: Principal | ICD-10-CM

## 2017-11-28 DIAGNOSIS — O9989 Other specified diseases and conditions complicating pregnancy, childbirth and the puerperium: Secondary | ICD-10-CM

## 2017-11-28 DIAGNOSIS — Z3A12 12 weeks gestation of pregnancy: Secondary | ICD-10-CM

## 2017-11-28 DIAGNOSIS — B373 Candidiasis of vulva and vagina: Secondary | ICD-10-CM

## 2017-11-28 DIAGNOSIS — N898 Other specified noninflammatory disorders of vagina: Secondary | ICD-10-CM

## 2017-11-28 DIAGNOSIS — B3731 Acute candidiasis of vulva and vagina: Secondary | ICD-10-CM | POA: Insufficient documentation

## 2017-11-28 DIAGNOSIS — Z3400 Encounter for supervision of normal first pregnancy, unspecified trimester: Secondary | ICD-10-CM

## 2017-11-28 LAB — POCT WET PREP (WET MOUNT)
Clue Cells Wet Prep Whiff POC: NEGATIVE
TRICHOMONAS WET PREP HPF POC: ABSENT

## 2017-11-28 MED ORDER — TERCONAZOLE 0.4 % VA CREA: 1.0000 | TOPICAL_CREAM | Freq: Every day | VAGINAL | 0 refills | Status: AC

## 2017-11-28 NOTE — Progress Notes (Signed)
OB issue C/o a lot of discharge/yellowish color/fishy odor/with cramping

## 2017-11-28 NOTE — Progress Notes (Signed)
    Prenatal Problem Visit  Subjective  Crystal Randall is a 27 y.o. G1P0 at [redacted]w[redacted]d being seen today for ongoing prenatal care.  She is currently monitored for the following issues for this low-risk pregnancy and has Supervision of normal first pregnancy, antepartum and Vulvovaginal candidiasis on their problem list.  ----------------------------------------------------------------------------------- Patient reports yellow discharge that has been ongoing for several days. She reports that it has an odor. She has had some mild cramping. She reports some vulvar itching. No vaginal irritation.   Vag. Bleeding: None.  No leaking of fluid.  ----------------------------------------------------------------------------------- The following portions of the patient's history were reviewed and updated as appropriate: allergies, current medications, past family history, past medical history, past social history, past surgical history and problem list. Problem list updated.  Objective  Blood pressure (!) 106/50, weight 147 lb (66.7 kg), last menstrual period 08/31/2017. Pregravid weight 147 lb (66.7 kg) Total Weight Gain 0 lb (0 kg)   General:  Alert, oriented and cooperative. Patient is in no acute distress.  Skin: Skin is warm and dry. No rash noted.   Cardiovascular: Normal heart rate noted  Respiratory: Normal respiratory effort, no problems with respiration noted  Abdomen: Soft, gravid, appropriate for gestational age.       Pelvic:  Cervical exam deferred        Extremities: Normal range of motion.     Mental Status: Normal mood and affect. Normal behavior. Normal judgment and thought content.   Normal appearing external genitalia. Small amount of thick yellow discharge seen at introitus.  Wet prep positive for yeast, negative for clue cells and trichomonads. Negative whiff, pH 4.  Assessment   27 y.o. G1P0 at [redacted]w[redacted]d, EDD 06/07/2018 by Last Menstrual Period presenting for a prenatal problem  visit.  Plan   Pregnancy#1 Problems (from 10/26/17 to present)    Problem Noted Resolved   Supervision of normal first pregnancy, antepartum 10/26/2017 by Rod Can, CNM No   Overview Addendum 11/28/2017  3:50 PM by Rexene Agent, [place] Prenatal Labs  Dating  Blood type: B/Positive/-- (08/09 1604)   Genetic Screen 1 Screen:    AFP:     Quad:     NIPS: Antibody:Negative (08/09 1604)  Anatomic [place]  Rubella: <0.90 (08/09 1604) Varicella: Immune  GTT Early:               Third trimester:  RPR: Non Reactive (08/09 1604)   Rhogam  HBsAg: Negative (08/09 1604)   TDaP vaccine                       Flu Shot: HIV: Non Reactive (08/09 1604)   Baby Food                                GBS:   Contraception  Pap:  CBB     CS/VBAC NA   Support Person [employer] Rx for terconazole to treat yeast infection. Also sent swab to rule out BV.  Keep scheduled ROB next Monday.  Avel Sensor, CNM 11/28/2017

## 2017-11-28 NOTE — Telephone Encounter (Signed)
Pt thinks she either has a bacterial inf or a yeast inf.  Has appt on the 16th but not sure it's okay to wait that long.  [phone]  Pt is discharging a lot, has odor, was white now is more yellow.  Tx'd to SP to sched appt.

## 2017-11-29 ENCOUNTER — Encounter: Payer: Self-pay | Admitting: Maternal Newborn

## 2017-11-30 ENCOUNTER — Other Ambulatory Visit: Payer: Self-pay | Admitting: Maternal Newborn

## 2017-11-30 DIAGNOSIS — B9689 Other specified bacterial agents as the cause of diseases classified elsewhere: Secondary | ICD-10-CM

## 2017-11-30 DIAGNOSIS — N760 Acute vaginitis: Principal | ICD-10-CM

## 2017-11-30 LAB — CERVICOVAGINAL ANCILLARY ONLY
Bacterial vaginitis: POSITIVE — AB
CANDIDA VAGINITIS: NEGATIVE

## 2017-11-30 MED ORDER — METRONIDAZOLE 500 MG PO TABS: 500.0000 mg | ORAL_TABLET | Freq: Two times a day (BID) | ORAL | 0 refills | Status: AC

## 2017-11-30 NOTE — Progress Notes (Signed)
RX metronidazole for BV.

## 2017-12-03 ENCOUNTER — Encounter: Payer: Self-pay | Admitting: Obstetrics and Gynecology

## 2017-12-03 ENCOUNTER — Ambulatory Visit (INDEPENDENT_AMBULATORY_CARE_PROVIDER_SITE_OTHER): Payer: Medicaid Other | Admitting: Obstetrics and Gynecology

## 2017-12-03 VITALS — BP 112/70 | Wt 146.0 lb

## 2017-12-03 DIAGNOSIS — Z3A13 13 weeks gestation of pregnancy: Secondary | ICD-10-CM

## 2017-12-03 DIAGNOSIS — Z3401 Encounter for supervision of normal first pregnancy, first trimester: Secondary | ICD-10-CM

## 2017-12-03 DIAGNOSIS — Z3400 Encounter for supervision of normal first pregnancy, unspecified trimester: Secondary | ICD-10-CM

## 2017-12-03 NOTE — Progress Notes (Signed)
  Routine Prenatal Care Visit  Subjective  Crystal Randall is a 27 y.o. G1P0 at [redacted]w[redacted]d being seen today for ongoing prenatal care.  She is currently monitored for the following issues for this low-risk pregnancy and has Supervision of normal first pregnancy, antepartum and Vulvovaginal candidiasis on their problem list.  ----------------------------------------------------------------------------------- Patient reports no complaints.   Contractions: Not present. Vag. Bleeding: None.  Movement: Absent. Denies leaking of fluid.  ----------------------------------------------------------------------------------- The following portions of the patient's history were reviewed and updated as appropriate: allergies, current medications, past family history, past medical history, past social history, past surgical history and problem list. Problem list updated.   Objective  Blood pressure 112/70, weight 146 lb (66.2 kg), last menstrual period 08/31/2017. Pregravid weight 147 lb (66.7 kg) Total Weight Gain -1 lb (-0.454 kg) Urinalysis: Urine Protein    Urine Glucose    Fetal Status: Fetal Heart Rate (bpm): present   Movement: Absent     General:  Alert, oriented and cooperative. Patient is in no acute distress.  Skin: Skin is warm and dry. No rash noted.   Cardiovascular: Normal heart rate noted  Respiratory: Normal respiratory effort, no problems with respiration noted  Abdomen: Soft, gravid, appropriate for gestational age. Pain/Pressure: Absent     Pelvic:  Cervical exam deferred        Extremities: Normal range of motion.     Mental Status: Normal mood and affect. Normal behavior. Normal judgment and thought content.   Assessment   27 y.o. G1P0 at [redacted]w[redacted]d by  06/07/2018, by Last Menstrual Period presenting for routine prenatal visit  Plan   Pregnancy#1 Problems (from 10/26/17 to present)    Problem Noted Resolved   Supervision of normal first pregnancy, antepartum 10/26/2017 by Rod Can, CNM No   Overview Addendum 11/28/2017  3:50 PM by Rexene Agent, [place] Prenatal Labs  Dating  Blood type: B/Positive/-- (08/09 1604)   Genetic Screen 1 Screen:    AFP:     Quad:     NIPS: Antibody:Negative (08/09 1604)  Anatomic [place]  Rubella: <0.90 (08/09 1604) Varicella: Immune  GTT Early:               Third trimester:  RPR: Non Reactive (08/09 1604)   Rhogam  HBsAg: Negative (08/09 1604)   TDaP vaccine                       Flu Shot: HIV: Non Reactive (08/09 1604)   Baby Food                                GBS:   Contraception  Pap:  CBB     CS/VBAC NA   Support Person Scott             Preterm labor symptoms and general obstetric precautions including but not limited to vaginal bleeding, contractions, leaking of fluid and fetal movement were reviewed in detail with the patient. Please refer to After Visit Summary for other counseling recommendations.   Return in about 4 weeks (around 12/31/2017) for Routine Prenatal Appointment.  Prentice Docker, MD, [facility], [facility] 12/03/2017 3:13 PM

## 2017-12-28 ENCOUNTER — Encounter: Payer: Self-pay | Admitting: Obstetrics and Gynecology

## 2017-12-28 ENCOUNTER — Ambulatory Visit (INDEPENDENT_AMBULATORY_CARE_PROVIDER_SITE_OTHER): Payer: Medicaid Other | Admitting: Obstetrics and Gynecology

## 2017-12-28 VITALS — BP 110/62 | Wt 150.0 lb

## 2017-12-28 DIAGNOSIS — O99612 Diseases of the digestive system complicating pregnancy, second trimester: Secondary | ICD-10-CM

## 2017-12-28 DIAGNOSIS — Z3400 Encounter for supervision of normal first pregnancy, unspecified trimester: Secondary | ICD-10-CM

## 2017-12-28 DIAGNOSIS — Z3A17 17 weeks gestation of pregnancy: Secondary | ICD-10-CM

## 2017-12-28 DIAGNOSIS — K219 Gastro-esophageal reflux disease without esophagitis: Secondary | ICD-10-CM

## 2017-12-28 DIAGNOSIS — Z23 Encounter for immunization: Secondary | ICD-10-CM

## 2017-12-28 MED ORDER — SUCRALFATE 1 G PO TABS: 1.0000 g | ORAL_TABLET | Freq: Three times a day (TID) | ORAL | 3 refills | Status: DC

## 2017-12-28 NOTE — Progress Notes (Signed)
    Routine Prenatal Care Visit  Subjective  Javeah Z Bold is a 27 y.o. G1P0 at [redacted]w[redacted]d being seen today for ongoing prenatal care.  She is currently monitored for the following issues for this low-risk pregnancy and has Supervision of normal first pregnancy, antepartum and Vulvovaginal candidiasis on their problem list.  ----------------------------------------------------------------------------------- Patient reports no complaints.   Contractions: Not present. Vag. Bleeding: None.  Movement: Absent. Denies leaking of fluid.  ----------------------------------------------------------------------------------- The following portions of the patient's history were reviewed and updated as appropriate: allergies, current medications, past family history, past medical history, past social history, past surgical history and problem list. Problem list updated.   Objective  Blood pressure 110/62, weight 150 lb (68 kg), last menstrual period 08/31/2017. Pregravid weight 147 lb (66.7 kg) Total Weight Gain 3 lb (1.361 kg) Urinalysis:      Fetal Status: Fetal Heart Rate (bpm): 152   Movement: Absent     General:  Alert, oriented and cooperative. Patient is in no acute distress.  Skin: Skin is warm and dry. No rash noted.   Cardiovascular: Normal heart rate noted  Respiratory: Normal respiratory effort, no problems with respiration noted  Abdomen: Soft, gravid, appropriate for gestational age. Pain/Pressure: Absent     Pelvic:  Cervical exam deferred        Extremities: Normal range of motion.     ental Status: Normal mood and affect. Normal behavior. Normal judgment and thought content.     Assessment   27 y.o. G1P0 at [redacted]w[redacted]d by  06/07/2018, by Last Menstrual Period presenting for routine prenatal visit  Plan   Pregnancy#1 Problems (from 10/26/17 to present)    Problem Noted Resolved   Supervision of normal first pregnancy, antepartum 10/26/2017 by Rod Can, CNM No   Overview Addendum  12/28/2017  3:19 PM by Homero Fellers, [place] Prenatal Labs  Dating LMP =9wk [place] Blood type: B/Positive/-- (08/09 1604)   Genetic Screen   NIPS: normal XX Antibody:Negative (08/09 1604)  Anatomic [place]   Rubella: <0.90 (08/09 1604) Varicella: Immune  GTT  Third trimester:  RPR: Non Reactive (08/09 1604)   Rhogam  not needed HBsAg: Negative (08/09 1604)   TDaP vaccine                       Flu Shot: HIV: Non Reactive (08/09 1604)   Baby Food                                GBS:   Contraception  Pap: will need postpartum  CBB     CS/VBAC NA   Support Person Scott               Gestational age appropriate obstetric precautions including but not limited to vaginal bleeding, contractions, leaking of fluid and fetal movement were reviewed in detail with the patient.    Flu shot today Prescription for GERD with carafate Given information on prenatal classes with [facility].  Given information on volunteer doula program at the hospital.  Discussed breast feeding. Patient is planning on breast feeding.   Return in about 3 weeks (around 01/18/2018) for [place] and [place].  Adrian Prows MD [facility], [facility] 12/28/17 3:37 PM

## 2017-12-28 NOTE — Progress Notes (Signed)
ROB No concerns Declines flu may wants later

## 2018-01-18 ENCOUNTER — Encounter: Payer: Self-pay | Admitting: Maternal Newborn

## 2018-01-18 ENCOUNTER — Ambulatory Visit (INDEPENDENT_AMBULATORY_CARE_PROVIDER_SITE_OTHER): Payer: Medicaid Other

## 2018-01-18 ENCOUNTER — Ambulatory Visit (INDEPENDENT_AMBULATORY_CARE_PROVIDER_SITE_OTHER): Payer: Medicaid Other | Admitting: Maternal Newborn

## 2018-01-18 VITALS — BP 100/80 | Wt 148.0 lb

## 2018-01-18 DIAGNOSIS — Z3402 Encounter for supervision of normal first pregnancy, second trimester: Secondary | ICD-10-CM

## 2018-01-18 DIAGNOSIS — Z363 Encounter for antenatal screening for malformations: Secondary | ICD-10-CM

## 2018-01-18 DIAGNOSIS — Z3A20 20 weeks gestation of pregnancy: Secondary | ICD-10-CM

## 2018-01-18 DIAGNOSIS — Z3400 Encounter for supervision of normal first pregnancy, unspecified trimester: Secondary | ICD-10-CM

## 2018-01-18 LAB — POCT URINALYSIS DIPSTICK OB
Glucose, UA: NEGATIVE
POC,PROTEIN,UA: NEGATIVE

## 2018-01-18 NOTE — Progress Notes (Signed)
    Routine Prenatal Care Visit  Subjective  Crystal Randall is a 27 y.o. G1P0 at [redacted]w[redacted]d being seen today for ongoing prenatal care.  She is currently monitored for the following issues for this low-risk pregnancy and has Supervision of normal first pregnancy, antepartum and Vulvovaginal candidiasis on their problem list.  ----------------------------------------------------------------------------------- Patient reports occasional mild cramping.   Contractions: Not present. Vag. Bleeding: None.  Movement: Present. No leaking of fluid.  ----------------------------------------------------------------------------------- The following portions of the patient's history were reviewed and updated as appropriate: allergies, current medications, past family history, past medical history, past social history, past surgical history and problem list. Problem list updated.   Objective  Blood pressure 100/80, weight 148 lb (67.1 kg), last menstrual period 08/31/2017. Pregravid weight 147 lb (66.7 kg) Total Weight Gain 1 lb (0.454 kg) Body mass index is 25.4 kg/m.   Urinalysis: Protein Negative, Glucose Negative  Fetal Status: Fetal Heart Rate (bpm): 164 Fundal Height: 21 cm Movement: Present     General:  Alert, oriented and cooperative. Patient is in no acute distress.  Skin: Skin is warm and dry. No rash noted.   Cardiovascular: Normal heart rate noted  Respiratory: Normal respiratory effort, no problems with respiration noted  Abdomen: Soft, gravid, appropriate for gestational age. Pain/Pressure: Present     Pelvic:  Cervical exam deferred        Extremities: Normal range of motion.  Edema: None  Mental Status: Normal mood and affect. Normal behavior. Normal judgment and thought content.     Assessment   27 y.o. G1P0 at [redacted]w[redacted]d, EDD 06/07/2018 by Last Menstrual Period presenting for a routine prenatal visit.  Plan   Pregnancy#1 Problems (from 10/26/17 to present)    Problem Noted Resolved     Supervision of normal first pregnancy, antepartum 10/26/2017 by Rod Can, CNM No   Overview Addendum 12/28/2017  3:37 PM by Homero Fellers, [place] Prenatal Labs  Dating LMP =9wk [place] Blood type: B/Positive/-- (08/09 1604)   Genetic Screen   NIPS: normal XX Antibody:Negative (08/09 1604)  Anatomic [place]   Rubella: <0.90 (08/09 1604) Varicella: Immune  GTT  Third trimester:  RPR: Non Reactive (08/09 1604)   Rhogam  not needed HBsAg: Negative (08/09 1604)   TDaP vaccine                        Flu Shot:12/28/17 HIV: Non Reactive (08/09 1604)   Baby Food  Breast                              GBS:   Contraception  Pap: will need postpartum  CBB     CS/VBAC NA   Support Person Scott            Anatomy scan complete today and anatomy is normal. There is a small anterior placental bleed visualized. We discussed this finding and she is aware to notify us if she has any vaginal bleeding or persistent abdominal pain.  Please refer to After Visit Summary for other counseling recommendations.   Return in about 4 weeks (around 02/15/2018) for ROB.  Avel Sensor, CNM 01/18/2018  4:14 PM

## 2018-01-18 NOTE — Progress Notes (Signed)
C/o cramping lower abdomen.rj

## 2018-01-18 NOTE — Patient Instructions (Signed)

## 2018-01-20 ENCOUNTER — Other Ambulatory Visit: Payer: Self-pay

## 2018-01-20 ENCOUNTER — Observation Stay
Admission: EM | Admit: 2018-01-20 | Discharge: 2018-01-20 | Disposition: A | Discharge status: Home / Self Care | Payer: Medicaid Other | Attending: Obstetrics and Gynecology | Admitting: Obstetrics and Gynecology

## 2018-01-20 DIAGNOSIS — F17210 Nicotine dependence, cigarettes, uncomplicated: Secondary | ICD-10-CM | POA: Diagnosis not present

## 2018-01-20 DIAGNOSIS — O4692 Antepartum hemorrhage, unspecified, second trimester: Principal | ICD-10-CM | POA: Diagnosis present

## 2018-01-20 DIAGNOSIS — Z3A20 20 weeks gestation of pregnancy: Secondary | ICD-10-CM | POA: Insufficient documentation

## 2018-01-20 DIAGNOSIS — O99332 Smoking (tobacco) complicating pregnancy, second trimester: Secondary | ICD-10-CM | POA: Insufficient documentation

## 2018-01-20 DIAGNOSIS — Z3400 Encounter for supervision of normal first pregnancy, unspecified trimester: Secondary | ICD-10-CM

## 2018-01-20 LAB — CHLAMYDIA/NGC RT PCR (ARMC ONLY)
Chlamydia Tr: NOT DETECTED
N GONORRHOEAE: NOT DETECTED

## 2018-01-20 LAB — URINALYSIS, COMPLETE (UACMP) WITH MICROSCOPIC
BACTERIA UA: NONE SEEN
BILIRUBIN URINE: NEGATIVE
Glucose, UA: NEGATIVE mg/dL
Hgb urine dipstick: NEGATIVE
Ketones, ur: NEGATIVE mg/dL
NITRITE: NEGATIVE
Protein, ur: NEGATIVE mg/dL
Specific Gravity, Urine: 1.006 (ref 1.005–1.030)
pH: 7 (ref 5.0–8.0)

## 2018-01-20 LAB — WET PREP, GENITAL
CLUE CELLS WET PREP: NONE SEEN
SPERM: NONE SEEN
Trich, Wet Prep: NONE SEEN
Yeast Wet Prep HPF POC: NONE SEEN

## 2018-01-20 NOTE — Discharge Summary (Signed)
See Final Progress Note

## 2018-01-20 NOTE — OB Triage Note (Signed)
Patient here for small amount of vaginal bleeding when going to the bathroom this morning. Right lower quad abdominal pain that comes and goes. Reports positive fetal movement, denies LOF.

## 2018-01-20 NOTE — Final Progress Note (Signed)
Physician Final Progress Note  Patient ID: Crystal Randall MRN: [id number] DOB/AGE: 24-Apr-1990 27 y.o.  Admit date: 01/20/2018 Admitting provider: Will Bonnet, MD Discharge date: 01/20/2018   Admission Diagnoses:  1) intrauterine pregnancy at [redacted]w[redacted]d  2) vaginal bleeding, second trimester (principal problem)  Discharge Diagnoses:  1) intrauterine pregnancy at [redacted]w[redacted]d  2) vaginal bleeding, second trimester (principal problem) - no evidence of bleeding on exam  History of Present Illness: The patient is a 27 y.o. female G1P0 at [redacted]w[redacted]d who presents for a single episode of vaginal bleeding today. She had been having cramping earlier in the day.  She went to the restroom at work and noted bright red spots of blood on the toilet paper. She did not have any blood on her underwear and has not noted any since then. There was also no blood in the toilet.  She notes occasional fluttering sensations in her lower abdomen, but no discrete movement yet in her pregnancy.  She denies a gush of fluid. She notes occasional right lower quadrant abdominal pain that has been present intermittently and mildly for the past month.  She had a short, mild episode earlier in the day today.  She denies vaginal symptoms of itching, burning, irritation, abnormal discharge, and abnormal odor. She has not had intercourse recently. She denies hematuria and hematochezia.     Hospital Course: She was admitted to Labor and Delivery triage for observation. Fetal heart tones were noted to be normal.  Tocometry was quiet.  She noted no bleeding at the time of arrival and exam.  The exam was notable for the complete absence of bleeding. No external or internal lesions noted. No active bleeding on vulva, perineum, vagina, and cervix. Her cervix was closed. Her vital signs were normal.  A wet prep, UA, and STD NAAT was sent. However, the patient did not want to stay to find out the results with the understanding that I would call, if  anything is abnormal.  She was discharged in stable condition with the usual precautions.  Follow up earlier than her scheduled appointment, if needed.   Past Medical History:  Diagnosis Date  . Asthma     Past Surgical History:  Procedure Laterality Date  . NO PAST SURGERIES       No current facility-administered medications on file prior to encounter.    Current Outpatient Medications on File Prior to Encounter  Medication Sig Dispense Refill  . Prenatal Vit-Fe Fumarate-FA (PRENATAL PO) Take by mouth.    . ondansetron (ZOFRAN ODT) 4 MG disintegrating tablet Take 1 tablet (4 mg total) by mouth every 6 (six) hours as needed for nausea. (Patient not taking: Reported on 12/28/2017) 20 tablet 0  . sucralfate (CARAFATE) 1 g tablet Take 1 tablet (1 g total) by mouth 4 (four) times daily -  with meals and at bedtime. (Patient not taking: Reported on 01/20/2018) 90 tablet 3   Allergies: No Known Allergies  Social History   Socioeconomic History  . Marital status: Single    Spouse name: Not on file  . Number of children: Not on file  . Years of education: Not on file  . Highest education level: Not on file  Occupational History  . Not on file  Social Needs  . Financial resource strain: Not on file  . Food insecurity:    Worry: Not on file    Inability: Not on file  . Transportation needs:    Medical: Not on file  Non-medical: Not on file  Tobacco Use  . Smoking status: Current Every Day Smoker    Packs/day: 0.35    Years: 2.00    Pack years: 0.70    Types: Cigarettes  . Smokeless tobacco: Never Used  Substance and Sexual Activity  . Alcohol use: No  . Drug use: No  . Sexual activity: Not Currently  Lifestyle  . Physical activity:    Days per week: Not on file    Minutes per session: Not on file  . Stress: Not on file  Relationships  . Social connections:    Talks on phone: Not on file    Gets together: Not on file    Attends religious service: Not on file     Active member of club or organization: Not on file    Attends meetings of clubs or organizations: Not on file    Relationship status: Not on file  . Intimate partner violence:    Fear of current or ex partner: Not on file    Emotionally abused: Not on file    Physically abused: Not on file    Forced sexual activity: Not on file  Other Topics Concern  . Not on file  Social History Narrative  . Not on file   Family History: denies history of gynecologic cancers.   Review of Systems  Constitutional: Negative.   HENT: Negative.   Eyes: Negative.   Respiratory: Negative.   Cardiovascular: Negative.   Gastrointestinal: Positive for abdominal pain (mild, intermittent RLQ). Negative for blood in stool, constipation, diarrhea, heartburn, melena, nausea and vomiting.  Genitourinary: Negative.        See HPI  Musculoskeletal: Negative.   Skin: Negative.   Neurological: Negative.   Endo/Heme/Allergies: Negative.   Psychiatric/Behavioral: Negative.      Physical Exam: BP (!) 103/59   Pulse 91   Temp 98.3 F (36.8 C) (Oral)   LMP 08/31/2017 (Exact Date)   Physical Exam  Constitutional: She is oriented to person, place, and time. She appears well-developed and well-nourished. No distress.  Genitourinary: Vagina normal. Pelvic exam was performed with patient supine. There is no rash, tenderness, lesion or injury on the right labia. There is no rash, tenderness, lesion or injury on the left labia. Vagina exhibits no lesion. No erythema, tenderness or bleeding in the vagina. No signs of injury around the vagina. No vaginal discharge found. Cervix does not exhibit motion tenderness, lesion or polyp.  Genitourinary Comments: Cervix: closed/thick/high No blood in vaginal vault. No cervical bleeding.  No evidence of lesion that could explain the bleeding.  HENT:  Head: Normocephalic and atraumatic.  Eyes: Conjunctivae are normal. No scleral icterus.  Cardiovascular: Normal rate and regular  rhythm.  Pulmonary/Chest: Effort normal and breath sounds normal. No respiratory distress.  Abdominal: Soft. Bowel sounds are normal. She exhibits no distension. There is no tenderness. There is no guarding.  Gravid. Uterine fundus to level of umbilicus  Musculoskeletal: Normal range of motion. She exhibits no edema.  Neurological: She is alert and oriented to person, place, and time. No cranial nerve deficit.  Skin: Skin is warm and dry. No erythema.  Psychiatric: She has a normal mood and affect. Her behavior is normal. Judgment normal.   Female chaperone present for pelvic exam:   Consults: None  Significant Findings/ Diagnostic Studies:  Wet prep, UA, and STD NAAT pending  Procedures: fetal heart tones. Normal range for baseline fetal heart rate.   Discharge Condition: stable  Disposition: Discharge disposition:  01-Home or Self Care      home to self care  Diet: Regular diet  Discharge Activity: Activity as tolerated   Allergies as of 01/20/2018   No Known Allergies     Medication List    STOP taking these medications   ondansetron 4 MG disintegrating tablet Commonly known as:  ZOFRAN-ODT   sucralfate 1 g tablet Commonly known as:  CARAFATE     TAKE these medications   PRENATAL PO Take by mouth.      [facility]. Go on 02/18/2018.   Specialty:  Obstetrics and Gynecology Why:  or call as needed  Contact information: [address] [place] [address] [phone]          Total time spent taking care of this patient: 30 minutes  Signed: Prentice Docker, MD  01/20/2018, 2:10 PM

## 2018-01-20 NOTE — OB Triage Note (Signed)
Pt given option per Dr. Glennon Mac to go home prior to lab results coming back. Pt states that she wants to be discharged and will check her mychart for lab results.  MD also assures her that he will call if there are abnormal lab results.  Pt discharged with instructions to call office /return to hospital  if anymore vaginal bleeding or pelvic pain occurs.  Pt verbalizes understanding.

## 2018-01-29 ENCOUNTER — Emergency Department: Payer: Medicaid Other

## 2018-01-29 ENCOUNTER — Emergency Department
Admission: EM | Admit: 2018-01-29 | Discharge: 2018-01-30 | Disposition: A | Discharge status: Home / Self Care | Payer: Medicaid Other | Attending: Emergency Medicine | Admitting: Emergency Medicine

## 2018-01-29 ENCOUNTER — Encounter: Payer: Self-pay | Admitting: Emergency Medicine

## 2018-01-29 DIAGNOSIS — F17210 Nicotine dependence, cigarettes, uncomplicated: Secondary | ICD-10-CM | POA: Diagnosis not present

## 2018-01-29 DIAGNOSIS — J45901 Unspecified asthma with (acute) exacerbation: Secondary | ICD-10-CM | POA: Diagnosis not present

## 2018-01-29 DIAGNOSIS — R05 Cough: Secondary | ICD-10-CM | POA: Diagnosis present

## 2018-01-29 LAB — CBC WITH DIFFERENTIAL/PLATELET
ABS IMMATURE GRANULOCYTES: 0.06 10*3/uL (ref 0.00–0.07)
BASOS ABS: 0.1 10*3/uL (ref 0.0–0.1)
Basophils Relative: 1 %
Eosinophils Absolute: 0.6 10*3/uL — ABNORMAL HIGH (ref 0.0–0.5)
Eosinophils Relative: 5 %
HEMATOCRIT: 33.7 % — AB (ref 36.0–46.0)
Hemoglobin: 10.9 g/dL — ABNORMAL LOW (ref 12.0–15.0)
IMMATURE GRANULOCYTES: 1 %
LYMPHS ABS: 2.1 10*3/uL (ref 0.7–4.0)
Lymphocytes Relative: 17 %
MCH: 23.5 pg — ABNORMAL LOW (ref 26.0–34.0)
MCHC: 32.3 g/dL (ref 30.0–36.0)
MCV: 72.8 fL — ABNORMAL LOW (ref 80.0–100.0)
MONOS PCT: 9 %
Monocytes Absolute: 1.1 10*3/uL — ABNORMAL HIGH (ref 0.1–1.0)
NEUTROS ABS: 7.9 10*3/uL — AB (ref 1.7–7.7)
NEUTROS PCT: 67 %
NRBC: 0 % (ref 0.0–0.2)
PLATELETS: 268 10*3/uL (ref 150–400)
RBC: 4.63 MIL/uL (ref 3.87–5.11)
RDW: 14.6 % (ref 11.5–15.5)
WBC: 11.8 10*3/uL — ABNORMAL HIGH (ref 4.0–10.5)

## 2018-01-29 LAB — COMPREHENSIVE METABOLIC PANEL
ALBUMIN: 3.2 g/dL — AB (ref 3.5–5.0)
ALT: 12 U/L (ref 0–44)
ANION GAP: 7 (ref 5–15)
AST: 15 U/L (ref 15–41)
Alkaline Phosphatase: 65 U/L (ref 38–126)
BUN: <5 mg/dL — ABNORMAL LOW (ref 6–20)
CHLORIDE: 107 mmol/L (ref 98–111)
CO2: 23 mmol/L (ref 22–32)
Calcium: 9 mg/dL (ref 8.9–10.3)
Creatinine, Ser: 0.39 mg/dL — ABNORMAL LOW (ref 0.44–1.00)
GFR calc Af Amer: >60 mL/min (ref >60)
GFR calc non Af Amer: >60 mL/min (ref >60)
GLUCOSE: 86 mg/dL (ref 70–99)
POTASSIUM: 3.6 mmol/L (ref 3.5–5.1)
SODIUM: 137 mmol/L (ref 135–145)
Total Bilirubin: 0.5 mg/dL (ref 0.3–1.2)
Total Protein: 6.7 g/dL (ref 6.5–8.1)

## 2018-01-29 LAB — TROPONIN I: Troponin I: <0.03 ng/mL (ref <0.03)

## 2018-01-29 MED ORDER — SODIUM CHLORIDE 0.9 % IV BOLUS
1000.0000 mL | Freq: Once | INTRAVENOUS | Status: AC
  Administered 2018-01-29: 1000 mL via INTRAVENOUS

## 2018-01-29 NOTE — ED Provider Notes (Signed)
[facility] Emergency Department Provider Note  ____________________________________________  Time seen: Approximately 9:41 PM  I have reviewed the triage vital signs and the nursing notes.   HISTORY  Chief Complaint Cough and Asthma    HPI Crystal Randall is a 27 y.o. female who presents the emergency department complaining of chest pain, pleuritic chest pain, shortness of breath.  Patient reports that she has had symptoms for for a few days but worsened today.  Day.  Patient does have a history of asthma but states that she has had no audible wheezing.  She does not have an albuterol inhaler and she has not been able to try this medication to see if there is any improvement.  Patient reports that she felt like her heart was beating rapidly earlier but denies that sensation currently.  Patient is [redacted] weeks pregnant.  She denies any fevers or chills, nasal congestion, sore throat, ear pain, productive cough.   Patient denies any unilateral leg edema.  No history of DVT or PE.  No cardiac history.     Past Medical History:  Diagnosis Date  . Asthma     Patient Active Problem List   Diagnosis Date Noted  . Vaginal bleeding in pregnancy, second trimester 01/20/2018  . Vulvovaginal candidiasis 11/28/2017  . Supervision of normal first pregnancy, antepartum 10/26/2017    Past Surgical History:  Procedure Laterality Date  . NO PAST SURGERIES      Prior to Admission medications   Medication Sig Start Date End Date Taking? Authorizing Provider  Prenatal Vit-Fe Fumarate-FA (PRENATAL PO) Take by mouth.    [provider]    Allergies Patient has no known allergies.  No family history on file.  Social History Social History   Tobacco Use  . Smoking status: Current Every Day Smoker    Packs/day: 0.35    Years: 2.00    Pack years: 0.70    Types: Cigarettes  . Smokeless tobacco: Never Used  Substance Use Topics  . Alcohol use: No  . Drug  use: No     Review of Systems  Constitutional: No fever/chills Eyes: No visual changes. No discharge ENT: No upper respiratory complaints. Cardiovascular: Positive for chest pain. Respiratory: no cough.  Positive SOB. Gastrointestinal: No abdominal pain.  No nausea, no vomiting.  No diarrhea.  No constipation. Genitourinary: Negative for dysuria. No hematuria Musculoskeletal: Negative for musculoskeletal pain. Skin: Negative for rash, abrasions, lacerations, ecchymosis. Neurological: Negative for headaches, focal weakness or numbness. 10-point ROS otherwise negative.  ____________________________________________   PHYSICAL EXAM:  VITAL SIGNS: ED Triage Vitals [01/29/18 2102]  Enc Vitals Group     BP 115/66     Pulse Rate 94     Resp 20     Temp 98.8 F (37.1 C)     Temp Source Oral     SpO2 99 %     Weight 148 lb (67.1 kg)     Height 5\' 3"  (1.6 m)     Head Circumference      Peak Flow      Pain Score 0     Pain Loc      Pain Edu?      Excl. in [place]?      Constitutional: Alert and oriented. Well appearing and in no acute distress. Eyes: Conjunctivae are normal. PERRL. EOMI. Head: Atraumatic. ENT:      Ears: EAC and TM unremarkable bilaterally.      Nose: No congestion/rhinnorhea.  Mouth/Throat: Mucous membranes are moist.  Oropharynx is nonerythematous and nonedematous.  Uvula is midline. Neck: No stridor.  Hematological/Lymphatic/Immunilogical: No cervical lymphadenopathy. Cardiovascular: Normal rate, regular rhythm. Normal S1 and S2.  Good peripheral circulation. Respiratory: Normal respiratory effort without tachypnea or retractions. Lungs CTAB. Good air entry to the bases with no decreased or absent breath sounds. Gastrointestinal: Visibly gravid abdomen.  Bowel sounds 4 quadrants. Soft and nontender to palpation. No guarding or rigidity.  Palpable fundus is appreciated on exam.. No distention. No CVA tenderness. Musculoskeletal: Full range of motion to  all extremities. No gross deformities appreciated. Neurologic:  Normal speech and language. No gross focal neurologic deficits are appreciated.  Skin:  Skin is warm, dry and intact. No rash noted. Psychiatric: Mood and affect are normal. Speech and behavior are normal. Patient exhibits appropriate insight and judgement.   ____________________________________________   LABS (all labs ordered are listed, but only abnormal results are displayed)  Labs Reviewed  COMPREHENSIVE METABOLIC PANEL - Abnormal; Notable for the following components:      Result Value   BUN <5 (*)    Creatinine, Ser 0.39 (*)    Albumin 3.2 (*)    All other components within normal limits  CBC WITH DIFFERENTIAL/PLATELET - Abnormal; Notable for the following components:   WBC 11.8 (*)    Hemoglobin 10.9 (*)    HCT 33.7 (*)    MCV 72.8 (*)    MCH 23.5 (*)    Neutro Abs 7.9 (*)    Monocytes Absolute 1.1 (*)    Eosinophils Absolute 0.6 (*)    All other components within normal limits  TROPONIN I  URINALYSIS, COMPLETE (UACMP) WITH MICROSCOPIC   ____________________________________________  EKG  ED ECG REPORT I, Charline Bills Cuthriell,  personally viewed and interpreted this ECG.   Date: 01/29/2018  EKG Time: 2210 hrs.  Rate: 86 bpm  Rhythm: normal sinus rhythm  Axis: Right axis deviation  Intervals:none  ST&T Change: No ST elevation or depression noted.  Normal sinus rhythm with right axis deviation.  Low voltage QRS complexes throughout.  No STEMI.  No pathological Q delta waves appreciated.   ____________________________________________  RADIOLOGY I personally viewed and evaluated these images as part of my medical decision making, as well as reviewing the written report by the radiologist.  Dg Chest 2 View  Result Date: 01/29/2018 CLINICAL DATA:  Chest pain, shortness of breath and cough for 1 week. EXAM: CHEST - 2 VIEW COMPARISON:  Chest radiograph January 08, 2017 FINDINGS: Cardiomediastinal  silhouette is normal. No pleural effusions or focal consolidations. Bronchitic changes. Trachea projects midline and there is no pneumothorax. Soft tissue planes and included osseous structures are non-suspicious. IMPRESSION: Bronchitic changes without focal consolidation. Electronically Signed   By: Elon Alas M.D.   On: 01/29/2018 22:46    ____________________________________________    PROCEDURES  Procedure(s) performed:    Procedures    Medications  sodium chloride 0.9 % bolus 1,000 mL (1,000 mLs Intravenous New Bag/Given 01/29/18 2211)     ____________________________________________   INITIAL IMPRESSION / ASSESSMENT AND PLAN / ED COURSE  Pertinent labs & imaging results that were available during my care of the patient were reviewed by me and considered in my medical decision making (see chart for details).  Review of the [place] CSRS was performed in accordance of the [place] prior to dispensing any controlled drugs.      Patient's diagnosis is consistent with chest pain shortness of breath.  Patient presents the emergency department  with 3-day history of worsening chest pain and associated shortness of breath.  Patient did not have any viral URI symptoms with fevers or chills, nasal congestion, sore throat, ear pain, cough.  On exam, exam was reassuring with no acute findings.  Patient is pregnant and as such had a high risk for embolism.  Patient was borderline tachycardic, mildly tachypneic with pleuritic chest pain.  At this time, d-dimer was not ordered as given pregnancy status it would most likely return positive.  Chest x-ray revealed mild bronchitic changes.  Patient does not have any wheezing or associated URI symptoms.  As such, proceeded with PE chest for further evaluation.  At this time, results are pending..  At this time, the section of the emergency department has closed, patient care will be transferred to Dr. Owens Shark.  I discussed patient's history, physical  exam findings, work-up to this point.  Final disposition and diagnosis will be provided by Dr. Owens Shark.       This chart was dictated using voice recognition software/Dragon. Despite best efforts to proofread, errors can occur which can change the meaning. Any change was purely unintentional.    Darletta Moll, PA-C 01/30/18 0046    Gregor Hams, MD 01/30/18 [phone]

## 2018-01-29 NOTE — ED Provider Notes (Signed)
ED ECG REPORT I, Anne-Caroline Mariea Clonts, the attending physician, personally viewed and interpreted this ECG.   Date: 01/29/2018  EKG Time: 2210  Rate: 86  Rhythm: normal sinus rhythm  Axis: normal  Intervals:none  ST&T Change: No STEMI   Did not see or evaluate this patient; this is my interpretation of her EKG.    Eula Listen, MD 01/29/18 2221

## 2018-01-29 NOTE — ED Triage Notes (Signed)
Pt reports has had a bad cough for the past week and a half and thinks her asthma is starting to act up. Pt does use an inhaler but has to get a new one. No SOB noted at this time. Pt denies fever but reports her cough is productive with yellow to clear phlem.

## 2018-01-30 ENCOUNTER — Encounter: Payer: Self-pay | Admitting: Radiology

## 2018-01-30 ENCOUNTER — Emergency Department: Payer: Medicaid Other

## 2018-01-30 MED ORDER — IOHEXOL 350 MG/ML SOLN
75.0000 mL | Freq: Once | INTRAVENOUS | Status: AC | PRN
  Administered 2018-01-30: 75 mL via INTRAVENOUS
  Filled 2018-01-30: qty 75

## 2018-01-30 MED ORDER — ALBUTEROL SULFATE (2.5 MG/3ML) 0.083% IN NEBU
2.5000 mg | INHALATION_SOLUTION | Freq: Once | RESPIRATORY_TRACT | Status: AC
  Administered 2018-01-30: 2.5 mg via RESPIRATORY_TRACT
  Filled 2018-01-30: qty 3

## 2018-01-30 MED ORDER — ALBUTEROL SULFATE HFA 108 (90 BASE) MCG/ACT IN AERS: 2.0000 | INHALATION_SPRAY | Freq: Four times a day (QID) | RESPIRATORY_TRACT | 1 refills | Status: DC | PRN

## 2018-01-30 NOTE — ED Notes (Signed)
Patient transported to CT 

## 2018-01-30 NOTE — ED Notes (Signed)
Pt returned from CT °

## 2018-01-30 NOTE — ED Notes (Signed)
Pt ambulated to the bathroom without difficulty. Pt returned to her room.

## 2018-02-18 ENCOUNTER — Encounter: Payer: Self-pay | Admitting: Obstetrics and Gynecology

## 2018-02-18 ENCOUNTER — Ambulatory Visit (INDEPENDENT_AMBULATORY_CARE_PROVIDER_SITE_OTHER): Payer: Medicaid Other | Admitting: Obstetrics and Gynecology

## 2018-02-18 VITALS — BP 110/60 | Wt 149.0 lb

## 2018-02-18 DIAGNOSIS — Z113 Encounter for screening for infections with a predominantly sexual mode of transmission: Secondary | ICD-10-CM

## 2018-02-18 DIAGNOSIS — Z3400 Encounter for supervision of normal first pregnancy, unspecified trimester: Secondary | ICD-10-CM

## 2018-02-18 DIAGNOSIS — Z3A24 24 weeks gestation of pregnancy: Secondary | ICD-10-CM

## 2018-02-18 DIAGNOSIS — O4692 Antepartum hemorrhage, unspecified, second trimester: Secondary | ICD-10-CM

## 2018-02-18 DIAGNOSIS — Z131 Encounter for screening for diabetes mellitus: Secondary | ICD-10-CM

## 2018-02-18 NOTE — Progress Notes (Signed)
  Routine Prenatal Care Visit  Subjective  Crystal Randall is a 27 y.o. G1P0 at [redacted]w[redacted]d being seen today for ongoing prenatal care.  She is currently monitored for the following issues for this low-risk pregnancy and has Supervision of normal first pregnancy, antepartum; Vulvovaginal candidiasis; and Vaginal bleeding in pregnancy, second trimester on their problem list.  ----------------------------------------------------------------------------------- Patient reports no complaints.   Contractions: Not present. Vag. Bleeding: None.  Movement: Present. Denies leaking of fluid.  ----------------------------------------------------------------------------------- The following portions of the patient's history were reviewed and updated as appropriate: allergies, current medications, past family history, past medical history, past social history, past surgical history and problem list. Problem list updated.   Objective  Blood pressure 110/60, weight 149 lb (67.6 kg), last menstrual period 08/31/2017. Pregravid weight 147 lb (66.7 kg) Total Weight Gain 2 lb (0.907 kg) Urinalysis: Urine Protein    Urine Glucose    Fetal Status: Fetal Heart Rate (bpm): 150 Fundal Height: 24 cm Movement: Present     General:  Alert, oriented and cooperative. Patient is in no acute distress.  Skin: Skin is warm and dry. No rash noted.   Cardiovascular: Normal heart rate noted  Respiratory: Normal respiratory effort, no problems with respiration noted  Abdomen: Soft, gravid, appropriate for gestational age. Pain/Pressure: Absent     Pelvic:  Cervical exam deferred        Extremities: Normal range of motion.  Edema: None  Mental Status: Normal mood and affect. Normal behavior. Normal judgment and thought content.   Assessment   27 y.o. G1P0 at [redacted]w[redacted]d by  06/07/2018, by Last Menstrual Period presenting for routine prenatal visit  Plan   Pregnancy#1 Problems (from 10/26/17 to present)    Problem Noted Resolved   Vaginal bleeding in pregnancy, second trimester 01/20/2018 by Will Bonnet, MD No   Supervision of normal first pregnancy, antepartum 10/26/2017 by Rod Can, CNM No   Overview Addendum 12/28/2017  3:37 PM by Homero Fellers, [place] Prenatal Labs  Dating LMP =9wk [place] Blood type: B/Positive/-- (08/09 1604)   Genetic Screen   NIPS: normal XX Antibody:Negative (08/09 1604)  Anatomic [place]   Rubella: <0.90 (08/09 1604) Varicella: Immune  GTT  Third trimester:  RPR: Non Reactive (08/09 1604)   Rhogam  not needed HBsAg: Negative (08/09 1604)   TDaP vaccine                        Flu Shot:12/28/17 HIV: Non Reactive (08/09 1604)   Baby Food  Breast                              GBS:   Contraception  Pap: will need postpartum  CBB     CS/VBAC NA   Support Person Scott               Preterm labor symptoms and general obstetric precautions including but not limited to vaginal bleeding, contractions, leaking of fluid and fetal movement were reviewed in detail with the patient. Please refer to After Visit Summary for other counseling recommendations.   Return in about 4 weeks (around 03/18/2018) for 28 week labs and routine prenatal.  Prentice Docker, MD, [place], [facility] 02/18/2018 2:13 PM

## 2018-03-18 ENCOUNTER — Encounter: Payer: Self-pay | Admitting: Obstetrics and Gynecology

## 2018-03-18 ENCOUNTER — Other Ambulatory Visit: Payer: Medicaid Other

## 2018-03-18 ENCOUNTER — Ambulatory Visit (INDEPENDENT_AMBULATORY_CARE_PROVIDER_SITE_OTHER): Payer: Medicaid Other | Admitting: Obstetrics and Gynecology

## 2018-03-18 VITALS — BP 110/68 | Wt 151.0 lb

## 2018-03-18 DIAGNOSIS — Z3400 Encounter for supervision of normal first pregnancy, unspecified trimester: Secondary | ICD-10-CM

## 2018-03-18 DIAGNOSIS — Z3403 Encounter for supervision of normal first pregnancy, third trimester: Secondary | ICD-10-CM

## 2018-03-18 DIAGNOSIS — Z113 Encounter for screening for infections with a predominantly sexual mode of transmission: Secondary | ICD-10-CM

## 2018-03-18 DIAGNOSIS — Z131 Encounter for screening for diabetes mellitus: Secondary | ICD-10-CM

## 2018-03-18 DIAGNOSIS — Z3A28 28 weeks gestation of pregnancy: Secondary | ICD-10-CM

## 2018-03-18 LAB — POCT URINALYSIS DIPSTICK OB
GLUCOSE, UA: NEGATIVE
POC,PROTEIN,UA: NEGATIVE

## 2018-03-18 NOTE — Addendum Note (Signed)
Addended by: Meryl Dare on: 03/18/2018 03:37 PM   Modules accepted: Orders

## 2018-03-18 NOTE — Progress Notes (Signed)
Routine Prenatal Care Visit  Subjective  Crystal Randall is a 27 y.o. G1P0 at [redacted]w[redacted]d being seen today for ongoing prenatal care.  She is currently monitored for the following issues for this low-risk pregnancy and has Supervision of normal first pregnancy, antepartum; Vulvovaginal candidiasis; and Vaginal bleeding in pregnancy, second trimester on their problem list.  ----------------------------------------------------------------------------------- Patient reports peri-umbilical pain that comes and goes. Denies history of hernia. No provoking or alleviating factors. No associated symptoms. Not present currently.   Contractions: Not present. Vag. Bleeding: None.  Movement: Present. Denies leaking of fluid.  ----------------------------------------------------------------------------------- The following portions of the patient's history were reviewed and updated as appropriate: allergies, current medications, past family history, past medical history, past social history, past surgical history and problem list. Problem list updated.   Objective  Blood pressure 110/68, weight 151 lb (68.5 kg), last menstrual period 08/31/2017. Pregravid weight 147 lb (66.7 kg) Total Weight Gain 4 lb (1.814 kg) Urinalysis: Urine Protein    Urine Glucose    Fetal Status: Fetal Heart Rate (bpm): 145 Fundal Height: 28 cm Movement: Present     General:  Alert, oriented and cooperative. Patient is in no acute distress.  Skin: Skin is warm and dry. No rash noted.   Cardiovascular: Normal heart rate noted  Respiratory: Normal respiratory effort, no problems with respiration noted  Abdomen: Soft, gravid, appropriate for gestational age. Pain/Pressure: Present, no herniated tissue noted. No pain on deep palpation of the umbilical region.   Pelvic:  Cervical exam deferred        Extremities: Normal range of motion.  Edema: None  Mental Status: Normal mood and affect. Normal behavior. Normal judgment and thought  content.   Assessment   27 y.o. G1P0 at [redacted]w[redacted]d by  06/07/2018, by Last Menstrual Period presenting for routine prenatal visit  Plan   Pregnancy#1 Problems (from 10/26/17 to present)    Problem Noted Resolved   Vaginal bleeding in pregnancy, second trimester 01/20/2018 by Will Bonnet, MD No   Supervision of normal first pregnancy, antepartum 10/26/2017 by Rod Can, CNM No   Overview Addendum 12/28/2017  3:37 PM by Homero Fellers, [place] Prenatal Labs  Dating LMP =9wk [place] Blood type: B/Positive/-- (08/09 1604)   Genetic Screen   NIPS: normal XX Antibody:Negative (08/09 1604)  Anatomic [place]   Rubella: <0.90 (08/09 1604) Varicella: Immune  GTT  Third trimester:  RPR: Non Reactive (08/09 1604)   Rhogam  not needed HBsAg: Negative (08/09 1604)   TDaP vaccine                        Flu Shot:12/28/17 HIV: Non Reactive (08/09 1604)   Baby Food  Breast                              GBS:   Contraception  Pap: will need postpartum  CBB     CS/VBAC NA   Support Person Scott               Preterm labor symptoms and general obstetric precautions including but not limited to vaginal bleeding, contractions, leaking of fluid and fetal movement were reviewed in detail with the patient. Please refer to After Visit Summary for other counseling recommendations.   - hernia precautions given - 28 week labs today.   Return in about 2 weeks (around 04/01/2018) for Routine Prenatal Appointment.  Crystal Randall  Glennon Mac, MD, [facility], [facility] 03/18/2018 3:15 PM

## 2018-03-18 NOTE — Patient Instructions (Signed)

## 2018-03-19 LAB — 28 WEEK RH+PANEL
Basophils Absolute: 0.1 10*3/uL (ref 0.0–0.2)
Basos: 1 %
EOS (ABSOLUTE): 0.8 10*3/uL — AB (ref 0.0–0.4)
Eos: 8 %
GESTATIONAL DIABETES SCREEN: 84 mg/dL (ref 65–139)
HEMOGLOBIN: 9.3 g/dL — AB (ref 11.1–15.9)
HIV Screen 4th Generation wRfx: NONREACTIVE
Hematocrit: 28.5 % — ABNORMAL LOW (ref 34.0–46.6)
IMMATURE GRANS (ABS): 0.1 10*3/uL (ref 0.0–0.1)
IMMATURE GRANULOCYTES: 1 %
LYMPHS: 21 %
Lymphocytes Absolute: 2.2 10*3/uL (ref 0.7–3.1)
MCH: 23.7 pg — AB (ref 26.6–33.0)
MCHC: 32.6 g/dL (ref 31.5–35.7)
MCV: 73 fL — AB (ref 79–97)
Monocytes Absolute: 0.9 10*3/uL (ref 0.1–0.9)
Monocytes: 8 %
NEUTROS ABS: 6.7 10*3/uL (ref 1.4–7.0)
NEUTROS PCT: 61 %
Platelets: 268 10*3/uL (ref 150–450)
RBC: 3.92 x10E6/uL (ref 3.77–5.28)
RDW: 15.1 % (ref 12.3–15.4)
RPR: NONREACTIVE
WBC: 10.8 10*3/uL (ref 3.4–10.8)

## 2018-03-20 NOTE — L&D Delivery Note (Cosign Needed)
Date of delivery: 06/04/2018 Estimated Date of Delivery: 06/07/18 Patient's last menstrual period was 08/31/2017 (exact date). EGA: [redacted]w[redacted]d  Delivery Note At 8:47 AM a viable female was delivered via Vaginal, Spontaneous (Presentation: OA ;LOA  ).  APGAR: 9, 9; weight 2750 g.  Placenta status: spontaneous, intact.  Cord:  with the following complications: short .  Cord pH: N/A  Called to see patient for increasing pelvic pressure.  SVE C/C/+2.  Mom pushed to deliver a viable female infant.  The head followed by shoulders, which delivered without difficulty, and the rest of the body.  Bilateral compound hands.  No nuchal cord noted.  Baby to mom's chest.  Cord clamped and cut by father of the baby after 5 min delay.  No cord blood obtained.  Placenta delivered spontaneously, intact, with a 3-vessel cord.  Second degree vaginal laceration repaired with 3-0 Vicryl in standard fashion.  All counts correct.  Hemostasis obtained with IV pitocin and fundal massage.  Anesthesia:  epidural Episiotomy: None Lacerations: 2nd degree;Vaginal Suture Repair: 3.0 vicryl Est. Blood Loss (mL): 325  Mom to postpartum.  Baby Crystal Randall to Couplet care / Skin to Skin.  Crystal Oman Cipperly Leafy Ro RN SNM Rod Can CNM 06/04/2018, 9:53 AM

## 2018-04-05 ENCOUNTER — Encounter: Payer: Self-pay | Admitting: Obstetrics and Gynecology

## 2018-04-05 ENCOUNTER — Ambulatory Visit (INDEPENDENT_AMBULATORY_CARE_PROVIDER_SITE_OTHER): Payer: Medicaid Other | Admitting: Obstetrics and Gynecology

## 2018-04-05 VITALS — BP 90/64 | Wt 154.0 lb

## 2018-04-05 DIAGNOSIS — R109 Unspecified abdominal pain: Secondary | ICD-10-CM

## 2018-04-05 DIAGNOSIS — O26899 Other specified pregnancy related conditions, unspecified trimester: Secondary | ICD-10-CM

## 2018-04-05 DIAGNOSIS — Z3A31 31 weeks gestation of pregnancy: Secondary | ICD-10-CM

## 2018-04-05 DIAGNOSIS — Z3400 Encounter for supervision of normal first pregnancy, unspecified trimester: Secondary | ICD-10-CM

## 2018-04-05 DIAGNOSIS — O26893 Other specified pregnancy related conditions, third trimester: Secondary | ICD-10-CM

## 2018-04-05 NOTE — Progress Notes (Signed)
Routine Prenatal Care Visit  Subjective  Crystal Randall is a 28 y.o. G1P0 at [redacted]w[redacted]d being seen today for ongoing prenatal care.  She is currently monitored for the following issues for this low-risk pregnancy and has Supervision of normal first pregnancy, antepartum; Vulvovaginal candidiasis; and Vaginal bleeding in pregnancy, second trimester on their problem list.  ----------------------------------------------------------------------------------- Patient reports cramps in her abdomen.  Occasional, at worst 4/10.  No GI, or GU symptoms. Denies vaginal symptoms.  Declines vaginal exam today.   Contractions: Irritability. Vag. Bleeding: None.  Movement: Present. Denies leaking of fluid.  ----------------------------------------------------------------------------------- The following portions of the patient's history were reviewed and updated as appropriate: allergies, current medications, past family history, past medical history, past social history, past surgical history and problem list. Problem list updated.  Objective  Blood pressure 90/64, weight 154 lb (69.9 kg), last menstrual period 08/31/2017. Pregravid weight 147 lb (66.7 kg) Total Weight Gain 7 lb (3.175 kg) Urinalysis: Urine Protein    Urine Glucose    Fetal Status: Fetal Heart Rate (bpm): 145 Fundal Height: 30 cm Movement: Present     General:  Alert, oriented and cooperative. Patient is in no acute distress.  Skin: Skin is warm and dry. No rash noted.   Cardiovascular: Normal heart rate noted  Respiratory: Normal respiratory effort, no problems with respiration noted  Abdomen: Soft, gravid, appropriate for gestational age. Pain/Pressure: Present     Pelvic:  Cervical exam deferred        Extremities: Normal range of motion.  Edema: None  Mental Status: Normal mood and affect. Normal behavior. Normal judgment and thought content.   Assessment   28 y.o. G1P0 at [redacted]w[redacted]d by  06/07/2018, by Last Menstrual Period presenting for  routine prenatal visit  Plan   Pregnancy#1 Problems (from 10/26/17 to present)    Problem Noted Resolved   Vaginal bleeding in pregnancy, second trimester 01/20/2018 by Will Bonnet, MD No   Supervision of normal first pregnancy, antepartum 10/26/2017 by Rod Can, CNM No   Overview Addendum 12/28/2017  3:37 PM by Homero Fellers, [place] Prenatal Labs  Dating LMP =9wk [place] Blood type: B/Positive/-- (08/09 1604)   Genetic Screen   NIPS: normal XX Antibody:Negative (08/09 1604)  Anatomic [place]   Rubella: <0.90 (08/09 1604) Varicella: Immune  GTT  Third trimester:  RPR: Non Reactive (08/09 1604)   Rhogam  not needed HBsAg: Negative (08/09 1604)   TDaP vaccine                        Flu Shot:12/28/17 HIV: Non Reactive (08/09 1604)   Baby Food  Breast                              GBS:   Contraception  Pap: will need postpartum  CBB     CS/VBAC NA   Support Person Scott               Preterm labor symptoms and general obstetric precautions including but not limited to vaginal bleeding, contractions, leaking of fluid and fetal movement were reviewed in detail with the patient. Please refer to After Visit Summary for other counseling recommendations.   - stressed labor precautions and told her to have a low threshold to return to clinic if she is continuing to have symptoms or if they become worse.  Urine culture today.  She voiced  understanding and agreement.  Return in about 2 weeks (around 04/19/2018) for Routine Prenatal Appointment.  Prentice Docker, MD, [facility], [facility] 04/05/2018 2:41 PM

## 2018-04-09 LAB — URINE CULTURE

## 2018-04-19 ENCOUNTER — Ambulatory Visit (INDEPENDENT_AMBULATORY_CARE_PROVIDER_SITE_OTHER): Payer: Medicaid Other | Admitting: Maternal Newborn

## 2018-04-19 ENCOUNTER — Encounter: Payer: Self-pay | Admitting: Maternal Newborn

## 2018-04-19 VITALS — BP 100/60 | Wt 152.0 lb

## 2018-04-19 DIAGNOSIS — Z3400 Encounter for supervision of normal first pregnancy, unspecified trimester: Secondary | ICD-10-CM

## 2018-04-19 DIAGNOSIS — Z3403 Encounter for supervision of normal first pregnancy, third trimester: Secondary | ICD-10-CM

## 2018-04-19 DIAGNOSIS — Z3A33 33 weeks gestation of pregnancy: Secondary | ICD-10-CM

## 2018-04-19 NOTE — Progress Notes (Signed)
Routine Prenatal Care Visit  Subjective  Crystal Randall is a 28 y.o. G1P0 at [redacted]w[redacted]d being seen today for ongoing prenatal care.  She is currently monitored for the following issues for this low-risk pregnancy and has Supervision of normal first pregnancy, antepartum; Vulvovaginal candidiasis; and Vaginal bleeding in pregnancy, second trimester on their problem list.  ----------------------------------------------------------------------------------- Patient reports occasional Braxton-Hicks contractions.   Contractions: Irritability. Vag. Bleeding: None.  Movement: Present. No leaking of fluid.  ----------------------------------------------------------------------------------- The following portions of the patient's history were reviewed and updated as appropriate: allergies, current medications, past family history, past medical history, past social history, past surgical history and problem list. Problem list updated.  Objective  Blood pressure 100/60, weight 152 lb (68.9 kg), last menstrual period 08/31/2017. Pregravid weight 147 lb (66.7 kg) Total Weight Gain 5 lb (2.268 kg) Body mass index is 26.93 kg/m.   Fetal Status: Fetal Heart Rate (bpm): 149 Fundal Height: 33 cm Movement: Present     General:  Alert, oriented and cooperative. Patient is in no acute distress.  Skin: Skin is warm and dry. No rash noted.   Cardiovascular: Normal heart rate noted  Respiratory: Normal respiratory effort, no problems with respiration noted  Abdomen: Soft, gravid, appropriate for gestational age. Pain/Pressure: Present     Pelvic:  Cervical exam deferred        Extremities: Normal range of motion.  Edema: None  Mental Status: Normal mood and affect. Normal behavior. Normal judgment and thought content.    Assessment   28 y.o. G1P0 at [redacted]w[redacted]d, EDD 06/07/2018 by Last Menstrual Period presenting for a routine prenatal visit.  Plan   Pregnancy#1 Problems (from 10/26/17 to present)    Problem Noted  Resolved   Vaginal bleeding in pregnancy, second trimester 01/20/2018 by Will Bonnet, MD No   Supervision of normal first pregnancy, antepartum 10/26/2017 by Rod Can, CNM No   Overview Addendum 12/28/2017  3:37 PM by Homero Fellers, [place] Prenatal Labs  Dating LMP =9wk [place] Blood type: B/Positive/-- (08/09 1604)   Genetic Screen   NIPS: normal XX Antibody:Negative (08/09 1604)  Anatomic [place]   Rubella: <0.90 (08/09 1604) Varicella: Immune  GTT  Third trimester:  RPR: Non Reactive (08/09 1604)   Rhogam  not needed HBsAg: Negative (08/09 1604)   TDaP vaccine                        Flu Shot:12/28/17 HIV: Non Reactive (08/09 1604)   Baby Food  Breast                              GBS:   Contraception  Pap: will need postpartum  CBB     CS/VBAC NA   Support Person Scott            Declines TDaP vaccine.  Talked about delivery plans; she may want to deliver at [facility] because it is closer to her home. We discussed that providers at [facility] only deliver at [facility], and that she can transfer care to a practice that delivers at [facility] as desired and we would send records if she decides to do that.  Preterm labor symptoms and general obstetric precautions including but not limited to vaginal bleeding, contractions, leaking of fluid and fetal movement were reviewed.  Please refer to After Visit Summary for other counseling recommendations.   Return in  about 2 weeks (around 05/03/2018) for ROB.  Avel Sensor, CNM 04/19/2018  5:00 PM

## 2018-04-26 ENCOUNTER — Telehealth: Payer: Self-pay

## 2018-04-26 ENCOUNTER — Other Ambulatory Visit: Payer: Self-pay

## 2018-04-26 ENCOUNTER — Inpatient Hospital Stay
Admission: EM | Admit: 2018-04-26 | Discharge: 2018-04-26 | Disposition: A | Discharge status: Home / Self Care | Payer: Medicaid Other | Attending: Obstetrics and Gynecology | Admitting: Obstetrics and Gynecology

## 2018-04-26 DIAGNOSIS — O26893 Other specified pregnancy related conditions, third trimester: Secondary | ICD-10-CM

## 2018-04-26 DIAGNOSIS — Z3A34 34 weeks gestation of pregnancy: Secondary | ICD-10-CM

## 2018-04-26 DIAGNOSIS — R102 Pelvic and perineal pain: Secondary | ICD-10-CM

## 2018-04-26 NOTE — OB Triage Note (Signed)
Patient is here for "jolt like pain in her vagina" that has been happening for the past few days. She states that this pain happens a few times an hour. Nothing seems to make it worse or better. She does report some braxton hicks contractions but feels them more at night. Denies LOF or bleeding.

## 2018-04-26 NOTE — Telephone Encounter (Signed)
Pt c/o pains in vaginal area.  Is this normal or does she need to come in?  [phone]  Pt states pain is sharp, lot of lower cramping/pressure, tight all over maybe every 31m.  Adv we don't want pt to have more than 4ctxs at this point in preg.  To go to L&D for monitoring ctxs.  Crystal Randall aware.

## 2018-04-26 NOTE — Discharge Summary (Signed)
Physician Discharge Summary   Patient ID: Crystal Randall [id number] 28 y.o. 1990/07/06  Admit date: 04/26/2018  Discharge date and time: No discharge date for patient encounter.   Admitting Physician: Homero Fellers, MD   Discharge Physician: Adrian Prows MD  Admission Diagnoses: 34wks preg   Discharge Diagnoses: Discomfort in pregnancy   Admission Condition: good  Discharged Condition: good  Indication for Admission: Triage evaluation  Hospital Course: Patient was admitted for triage visit. She reported a shock like sensation in her vagina that occurred today and yesterday. She is otherwise feeling well. No pain with urination. No vaginal bleeding. No contractions. Good fetal movement.  NST: 140 bpm baseline, moderate variability, 15x15 accelerations, no decelerations. Tocometer : quiet    Consults: None  Significant Diagnostic Studies: None  Treatments: None  Discharge Exam: LMP 08/31/2017 (Exact Date)   General Appearance:    Alert, cooperative, no distress, appears stated age  Head:    Normocephalic, without obvious abnormality, atraumatic  Eyes:    PERRL, conjunctiva/corneas clear, EOM's intact, fundi    benign, both eyes  Ears:    Normal TM's and external ear canals, both ears  Nose:   Nares normal, septum midline, mucosa normal, no drainage    or sinus tenderness  Throat:   Lips, mucosa, and tongue normal; teeth and gums normal  Neck:   Supple, symmetrical, trachea midline, no adenopathy;    thyroid:  no enlargement/tenderness/nodules; no carotid   bruit or JVD  Back:     Symmetric, no curvature, ROM normal, no CVA tenderness  Lungs:     Clear to auscultation bilaterally, respirations unlabored  Chest Wall:    No tenderness or deformity   Heart:    Regular rate and rhythm, S1 and S2 normal, no murmur, rub   or gallop  Breast Exam:    No tenderness, masses, or nipple abnormality  Abdomen:     Soft, non-tender, bowel sounds active all four  quadrants,    no masses, no organomegaly  Genitalia:    Normal female without lesion, discharge or tenderness  Rectal:    Normal tone, normal prostate, no masses or tenderness;   guaiac negative stool  Extremities:   Extremities normal, atraumatic, no cyanosis or edema  Pulses:   2+ and symmetric all extremities  Skin:   Skin color, texture, turgor normal, no rashes or lesions  Lymph nodes:   Cervical, supraclavicular, and axillary nodes normal  Neurologic:   CNII-XII intact, normal strength, sensation and reflexes    throughout    Disposition: Discharge disposition: 01-Home or Self Care       Patient Instructions:  Allergies as of 04/26/2018   No Known Allergies     Medication List    TAKE these medications   albuterol 108 (90 Base) MCG/ACT inhaler Commonly known as:  PROVENTIL HFA;VENTOLIN HFA Inhale 2 puffs into the lungs every 6 (six) hours as needed for wheezing or shortness of breath.   PRENATAL PO Take by mouth.      Activity: activity as tolerated Diet: regular diet Wound Care: none needed  Follow-up with [facility] in 1 week .  Signed: Homero Fellers 04/26/2018 1:34 PM

## 2018-05-03 ENCOUNTER — Ambulatory Visit (INDEPENDENT_AMBULATORY_CARE_PROVIDER_SITE_OTHER): Payer: Medicaid Other | Admitting: Obstetrics & Gynecology

## 2018-05-03 VITALS — BP 108/60 | Wt 152.0 lb

## 2018-05-03 DIAGNOSIS — Z3400 Encounter for supervision of normal first pregnancy, unspecified trimester: Secondary | ICD-10-CM

## 2018-05-03 DIAGNOSIS — Z3A35 35 weeks gestation of pregnancy: Secondary | ICD-10-CM

## 2018-05-03 DIAGNOSIS — Z3403 Encounter for supervision of normal first pregnancy, third trimester: Secondary | ICD-10-CM

## 2018-05-03 LAB — POCT URINALYSIS DIPSTICK OB: GLUCOSE, UA: NEGATIVE

## 2018-05-03 NOTE — Patient Instructions (Signed)
Braxton Hicks Contractions Contractions of the uterus can occur throughout pregnancy, but they are not always a sign that you are in labor. You may have practice contractions called Braxton Hicks contractions. These false labor contractions are sometimes confused with true labor. What are Braxton Hicks contractions? Braxton Hicks contractions are tightening movements that occur in the muscles of the uterus before labor. Unlike true labor contractions, these contractions do not result in opening (dilation) and thinning of the cervix. Toward the end of pregnancy (32-34 weeks), Braxton Hicks contractions can happen more often and may become stronger. These contractions are sometimes difficult to tell apart from true labor because they can be very uncomfortable. You should not feel embarrassed if you go to the hospital with false labor. Sometimes, the only way to tell if you are in true labor is for your health care provider to look for changes in the cervix. The health care provider will do a physical exam and may monitor your contractions. If you are not in true labor, the exam should show that your cervix is not dilating and your water has not broken. If there are no other health problems associated with your pregnancy, it is completely safe for you to be sent home with false labor. You may continue to have Braxton Hicks contractions until you go into true labor. How to tell the difference between true labor and false labor True labor  Contractions last 30-70 seconds.  Contractions become very regular.  Discomfort is usually felt in the top of the uterus, and it spreads to the lower abdomen and low back.  Contractions do not go away with walking.  Contractions usually become more intense and increase in frequency.  The cervix dilates and gets thinner. False labor  Contractions are usually shorter and not as strong as true labor contractions.  Contractions are usually irregular.  Contractions  are often felt in the front of the lower abdomen and in the groin.  Contractions may go away when you walk around or change positions while lying down.  Contractions get weaker and are shorter-lasting as time goes on.  The cervix usually does not dilate or become thin. Follow these instructions at home:   Take over-the-counter and prescription medicines only as told by your health care provider.  Keep up with your usual exercises and follow other instructions from your health care provider.  Eat and drink lightly if you think you are going into labor.  If Braxton Hicks contractions are making you uncomfortable: ? Change your position from lying down or resting to walking, or change from walking to resting. ? Sit and rest in a tub of warm water. ? Drink enough fluid to keep your urine pale yellow. Dehydration may cause these contractions. ? Do slow and deep breathing several times an hour.  Keep all follow-up prenatal visits as told by your health care provider. This is important. Contact a health care provider if:  You have a fever.  You have continuous pain in your abdomen. Get help right away if:  Your contractions become stronger, more regular, and closer together.  You have fluid leaking or gushing from your vagina.  You pass blood-tinged mucus (bloody show).  You have bleeding from your vagina.  You have low back pain that you never had before.  You feel your baby's head pushing down and causing pelvic pressure.  Your baby is not moving inside you as much as it used to. Summary  Contractions that occur before labor are   called Braxton Hicks contractions, false labor, or practice contractions.  Braxton Hicks contractions are usually shorter, weaker, farther apart, and less regular than true labor contractions. True labor contractions usually become progressively stronger and regular, and they become more frequent.  Manage discomfort from Braxton Hicks contractions  by changing position, resting in a warm bath, drinking plenty of water, or practicing deep breathing. This information is not intended to replace advice given to you by your health care provider. Make sure you discuss any questions you have with your health care provider. Document Released: 07/20/2016 Document Revised: 12/19/2016 Document Reviewed: 07/20/2016 Elsevier Interactive Patient Education  2019 [employer]  

## 2018-05-03 NOTE — Progress Notes (Signed)
  Subjective  Fetal Movement? yes Contractions? Yes- BH's ctxs Leaking Fluid? no Vaginal Bleeding? no  Objective  BP 108/60   Wt 152 lb (68.9 kg)   LMP 08/31/2017 (Exact Date)   BMI 26.93 kg/m  General: NAD Pumonary: no increased work of breathing Abdomen: gravid, non-tender Extremities: no edema Psychiatric: mood appropriate, affect full  Assessment  28 y.o. G1P0 at [redacted]w[redacted]d by  06/07/2018, by Last Menstrual Period presenting for routine prenatal visit  Plan   Problem List Items Addressed This Visit      Other   Supervision of normal first pregnancy, antepartum - Primary   Relevant Orders   POC Urinalysis Dipstick OB (Completed)    Other Visit Diagnoses    [redacted] weeks gestation of pregnancy        Labor precautions, PNV, [place]  Barnett Applebaum, MD, [facility], [facility] 05/03/2018  4:21 PM

## 2018-05-10 ENCOUNTER — Ambulatory Visit (INDEPENDENT_AMBULATORY_CARE_PROVIDER_SITE_OTHER): Payer: Medicaid Other | Admitting: Maternal Newborn

## 2018-05-10 ENCOUNTER — Encounter: Payer: Self-pay | Admitting: Maternal Newborn

## 2018-05-10 ENCOUNTER — Other Ambulatory Visit (HOSPITAL_COMMUNITY)
Admission: RE | Admit: 2018-05-10 | Discharge: 2018-05-10 | Disposition: A | Discharge status: Home / Self Care | Payer: Medicaid Other | Source: Ambulatory Visit | Attending: Maternal Newborn | Admitting: Maternal Newborn

## 2018-05-10 VITALS — BP 100/60 | Wt 150.0 lb

## 2018-05-10 DIAGNOSIS — Z3400 Encounter for supervision of normal first pregnancy, unspecified trimester: Secondary | ICD-10-CM

## 2018-05-10 DIAGNOSIS — N6310 Unspecified lump in the right breast, unspecified quadrant: Secondary | ICD-10-CM

## 2018-05-10 DIAGNOSIS — O9989 Other specified diseases and conditions complicating pregnancy, childbirth and the puerperium: Secondary | ICD-10-CM

## 2018-05-10 DIAGNOSIS — Z3A36 36 weeks gestation of pregnancy: Secondary | ICD-10-CM

## 2018-05-10 LAB — OB RESULTS CONSOLE GC/CHLAMYDIA: Gonorrhea: NEGATIVE

## 2018-05-10 NOTE — Progress Notes (Signed)
    Routine Prenatal Care Visit  Subjective  Crystal Randall is a 28 y.o. G1P0 at [redacted]w[redacted]d being seen today for ongoing prenatal care.  She is currently monitored for the following issues for this low-risk pregnancy and has Supervision of normal first pregnancy, antepartum; Vulvovaginal candidiasis; and Vaginal bleeding in pregnancy, second trimester on their problem list.  ----------------------------------------------------------------------------------- Patient reports a lump in her right breast below the nipple.   Contractions: Not present. Vag. Bleeding: None.  Movement: Present. No leaking of fluid.  ----------------------------------------------------------------------------------- The following portions of the patient's history were reviewed and updated as appropriate: allergies, current medications, past family history, past medical history, past social history, past surgical history and problem list. Problem list updated.  Objective  Blood pressure 100/60, weight 150 lb (68 kg), last menstrual period 08/31/2017. Pregravid weight 147 lb (66.7 kg) Total Weight Gain 3 lb (1.361 kg) Body mass index is 26.57 kg/m.   Urinalysis: Urine dipstick shows negative for glucose, positive for protein (trace).  Fetal Status: Fetal Heart Rate (bpm): 152 Fundal Height: 35 cm Movement: Present     General:  Alert, oriented and cooperative. Patient is in no acute distress.  Skin: Skin is warm and dry. No rash noted.   Cardiovascular: Normal heart rate noted  Respiratory: Normal respiratory effort, no problems with respiration noted  Abdomen: Soft, gravid, appropriate for gestational age. Pain/Pressure: Present     Pelvic:  Cervical exam performed Dilation: Closed Effacement (%): Thick Station: Ballotable  Extremities: Normal range of motion.  Edema: None  Mental Status: Normal mood and affect. Normal behavior. Normal judgment and thought content.   Breast: Right breast lump palpated below nipple,  slightly right from midline.  Assessment   28 y.o. G1P0 at [redacted]w[redacted]d EDD 06/07/2018, by Last Menstrual Period presenting for a routine prenatal visit.  Plan   Pregnancy#1 Problems (from 10/26/17 to present)    Problem Noted Resolved   Vaginal bleeding in pregnancy, second trimester 01/20/2018 by Will Bonnet, MD No   Supervision of normal first pregnancy, antepartum 10/26/2017 by Rod Can, CNM No   Overview Addendum 12/28/2017  3:37 PM by Homero Fellers, [place] Prenatal Labs  Dating LMP =9wk [place] Blood type: B/Positive/-- (08/09 1604)   Genetic Screen   NIPS: normal XX Antibody:Negative (08/09 1604)  Anatomic [place]   Rubella: <0.90 (08/09 1604) Varicella: Immune  GTT  Third trimester:  RPR: Non Reactive (08/09 1604)   Rhogam  not needed HBsAg: Negative (08/09 1604)   TDaP vaccine                        Flu Shot:12/28/17 HIV: Non Reactive (08/09 1604)   Baby Food  Breast                              GBS:   Contraception  Pap: will need postpartum  CBB     CS/VBAC NA   Support Person Scott            Breast ultrasound ordered for right breast lump.  GBS and Aptima done today.  Preterm labor symptoms and general obstetric precautions including but not limited to vaginal bleeding, contractions, leaking of fluid and fetal movement were reviewed.  Please refer to After Visit Summary for other counseling recommendations.   Return in about 1 week (around 05/17/2018) for ROB.  Avel Sensor, CNM 05/10/2018

## 2018-05-12 LAB — STREP GP B NAA: Strep Gp B NAA: NEGATIVE

## 2018-05-13 LAB — CERVICOVAGINAL ANCILLARY ONLY
Chlamydia: NEGATIVE
Neisseria Gonorrhea: NEGATIVE

## 2018-05-14 NOTE — Patient Instructions (Signed)
Third Trimester of Pregnancy The third trimester is from week 28 through week 40 (months 7 through 9). The third trimester is a time when the unborn baby (fetus) is growing rapidly. At the end of the ninth month, the fetus is about 20 inches in length and weighs 6-10 pounds. Body changes during your third trimester Your body will continue to go through many changes during pregnancy. The changes vary from woman to woman. During the third trimester:  Your weight will continue to increase. You can expect to gain 25-35 pounds (11-16 kg) by the end of the pregnancy.  You may begin to get stretch marks on your hips, abdomen, and breasts.  You may urinate more often because the fetus is moving lower into your pelvis and pressing on your bladder.  You may develop or continue to have heartburn. This is caused by increased hormones that slow down muscles in the digestive tract.  You may develop or continue to have constipation because increased hormones slow digestion and cause the muscles that push waste through your intestines to relax.  You may develop hemorrhoids. These are swollen veins (varicose veins) in the rectum that can itch or be painful.  You may develop swollen, bulging veins (varicose veins) in your legs.  You may have increased body aches in the pelvis, back, or thighs. This is due to weight gain and increased hormones that are relaxing your joints.  You may have changes in your hair. These can include thickening of your hair, rapid growth, and changes in texture. Some women also have hair loss during or after pregnancy, or hair that feels dry or thin. Your hair will most likely return to normal after your baby is born.  Your breasts will continue to grow and they will continue to become tender. A yellow fluid (colostrum) may leak from your breasts. This is the first milk you are producing for your baby.  Your belly button may stick out.  You may notice more swelling in your hands,  face, or ankles.  You may have increased tingling or numbness in your hands, arms, and legs. The skin on your belly may also feel numb.  You may feel short of breath because of your expanding uterus.  You may have more problems sleeping. This can be caused by the size of your belly, increased need to urinate, and an increase in your body's metabolism.  You may notice the fetus "dropping," or moving lower in your abdomen (lightening).  You may have increased vaginal discharge.  You may notice your joints feel loose and you may have pain around your pelvic bone. What to expect at prenatal visits You will have prenatal exams every 2 weeks until week 36. Then you will have weekly prenatal exams. During a routine prenatal visit:  You will be weighed to make sure you and the baby are growing normally.  Your blood pressure will be taken.  Your abdomen will be measured to track your baby's growth.  The fetal heartbeat will be listened to.  Any test results from the previous visit will be discussed.  You may have a cervical check near your due date to see if your cervix has softened or thinned (effaced).  You will be tested for Group B streptococcus. This happens between 35 and 37 weeks. Your health care provider may ask you:  What your birth plan is.  How you are feeling.  If you are feeling the baby move.  If you have had any abnormal   symptoms, such as leaking fluid, bleeding, severe headaches, or abdominal cramping.  If you are using any tobacco products, including cigarettes, chewing tobacco, and electronic cigarettes.  If you have any questions. Other tests or screenings that may be performed during your third trimester include:  Blood tests that check for low iron levels (anemia).  Fetal testing to check the health, activity level, and growth of the fetus. Testing is done if you have certain medical conditions or if there are problems during the pregnancy.  Nonstress test  (NST). This test checks the health of your baby to make sure there are no signs of problems, such as the baby not getting enough oxygen. During this test, a belt is placed around your belly. The baby is made to move, and its heart rate is monitored during movement. What is false labor? False labor is a condition in which you feel small, irregular tightenings of the muscles in the womb (contractions) that usually go away with rest, changing position, or drinking water. These are called Braxton Hicks contractions. Contractions may last for hours, days, or even weeks before true labor sets in. If contractions come at regular intervals, become more frequent, increase in intensity, or become painful, you should see your health care provider. What are the signs of labor?  Abdominal cramps.  Regular contractions that start at 10 minutes apart and become stronger and more frequent with time.  Contractions that start on the top of the uterus and spread down to the lower abdomen and back.  Increased pelvic pressure and dull back pain.  A watery or bloody mucus discharge that comes from the vagina.  Leaking of amniotic fluid. This is also known as your "water breaking." It could be a slow trickle or a gush. Let your health care provider know if it has a color or strange odor. If you have any of these signs, call your health care provider right away, even if it is before your due date. Follow these instructions at home: Medicines  Follow your health care provider's instructions regarding medicine use. Specific medicines may be either safe or unsafe to take during pregnancy.  Take a prenatal vitamin that contains at least 600 micrograms (mcg) of folic acid.  If you develop constipation, try taking a stool softener if your health care provider approves. Eating and drinking   Eat a balanced diet that includes fresh fruits and vegetables, whole grains, good sources of protein such as meat, eggs, or tofu,  and low-fat dairy. Your health care provider will help you determine the amount of weight gain that is right for you.  Avoid raw meat and uncooked cheese. These carry germs that can cause birth defects in the baby.  If you have low calcium intake from food, talk to your health care provider about whether you should take a daily calcium supplement.  Eat four or five small meals rather than three large meals a day.  Limit foods that are high in fat and processed sugars, such as fried and sweet foods.  To prevent constipation: ? Drink enough fluid to keep your urine clear or pale yellow. ? Eat foods that are high in fiber, such as fresh fruits and vegetables, whole grains, and beans. Activity  Exercise only as directed by your health care provider. Most women can continue their usual exercise routine during pregnancy. Try to exercise for 30 minutes at least 5 days a week. Stop exercising if you experience uterine contractions.  Avoid heavy lifting.  Do   not exercise in extreme heat or humidity, or at high altitudes.  Wear low-heel, comfortable shoes.  Practice good posture.  You may continue to have sex unless your health care provider tells you otherwise. Relieving pain and discomfort  Take frequent breaks and rest with your legs elevated if you have leg cramps or low back pain.  Take warm sitz baths to soothe any pain or discomfort caused by hemorrhoids. Use hemorrhoid cream if your health care provider approves.  Wear a good support bra to prevent discomfort from breast tenderness.  If you develop varicose veins: ? Wear support pantyhose or compression stockings as told by your healthcare provider. ? Elevate your feet for 15 minutes, 3-4 times a day. Prenatal care  Write down your questions. Take them to your prenatal visits.  Keep all your prenatal visits as told by your health care provider. This is important. Safety  Wear your seat belt at all times when driving.  Make  a list of emergency phone numbers, including numbers for family, friends, the hospital, and police and fire departments. General instructions  Avoid cat litter boxes and soil used by cats. These carry germs that can cause birth defects in the baby. If you have a cat, ask someone to clean the litter box for you.  Do not travel far distances unless it is absolutely necessary and only with the approval of your health care provider.  Do not use hot tubs, steam rooms, or saunas.  Do not drink alcohol.  Do not use any products that contain nicotine or tobacco, such as cigarettes and e-cigarettes. If you need help quitting, ask your health care provider.  Do not use any medicinal herbs or unprescribed drugs. These chemicals affect the formation and growth of the baby.  Do not douche or use tampons or scented sanitary pads.  Do not cross your legs for long periods of time.  To prepare for the arrival of your baby: ? Take prenatal classes to understand, practice, and ask questions about labor and delivery. ? Make a trial run to the hospital. ? Visit the hospital and tour the maternity area. ? Arrange for maternity or paternity leave through employers. ? Arrange for family and friends to take care of pets while you are in the hospital. ? Purchase a rear-facing car seat and make sure you know how to install it in your car. ? Pack your hospital bag. ? Prepare the baby's nursery. Make sure to remove all pillows and stuffed animals from the baby's crib to prevent suffocation.  Visit your dentist if you have not gone during your pregnancy. Use a soft toothbrush to brush your teeth and be gentle when you floss. Contact a health care provider if:  You are unsure if you are in labor or if your water has broken.  You become dizzy.  You have mild pelvic cramps, pelvic pressure, or nagging pain in your abdominal area.  You have lower back pain.  You have persistent nausea, vomiting, or  diarrhea.  You have an unusual or bad smelling vaginal discharge.  You have pain when you urinate. Get help right away if:  Your water breaks before 37 weeks.  You have regular contractions less than 5 minutes apart before 37 weeks.  You have a fever.  You are leaking fluid from your vagina.  You have spotting or bleeding from your vagina.  You have severe abdominal pain or cramping.  You have rapid weight loss or weight gain.  You have   shortness of breath with chest pain.  You notice sudden or extreme swelling of your face, hands, ankles, feet, or legs.  Your baby makes fewer than 10 movements in 2 hours.  You have severe headaches that do not go away when you take medicine.  You have vision changes. Summary  The third trimester is from week 28 through week 40, months 7 through 9. The third trimester is a time when the unborn baby (fetus) is growing rapidly.  During the third trimester, your discomfort may increase as you and your baby continue to gain weight. You may have abdominal, leg, and back pain, sleeping problems, and an increased need to urinate.  During the third trimester your breasts will keep growing and they will continue to become tender. A yellow fluid (colostrum) may leak from your breasts. This is the first milk you are producing for your baby.  False labor is a condition in which you feel small, irregular tightenings of the muscles in the womb (contractions) that eventually go away. These are called Braxton Hicks contractions. Contractions may last for hours, days, or even weeks before true labor sets in.  Signs of labor can include: abdominal cramps; regular contractions that start at 10 minutes apart and become stronger and more frequent with time; watery or bloody mucus discharge that comes from the vagina; increased pelvic pressure and dull back pain; and leaking of amniotic fluid. This information is not intended to replace advice given to you by your  health care provider. Make sure you discuss any questions you have with your health care provider. Document Released: 02/28/2001 Document Revised: 04/11/2016 Document Reviewed: 04/11/2016 Elsevier Interactive Patient Education  2019 [employer]  

## 2018-05-15 ENCOUNTER — Ambulatory Visit
Admission: RE | Admit: 2018-05-15 | Discharge: 2018-05-15 | Disposition: A | Discharge status: Home / Self Care | Payer: Medicaid Other | Source: Ambulatory Visit | Attending: Maternal Newborn | Admitting: Maternal Newborn

## 2018-05-15 DIAGNOSIS — N6310 Unspecified lump in the right breast, unspecified quadrant: Secondary | ICD-10-CM | POA: Diagnosis present

## 2018-05-16 ENCOUNTER — Other Ambulatory Visit: Payer: Self-pay | Admitting: Maternal Newborn

## 2018-05-16 DIAGNOSIS — R928 Other abnormal and inconclusive findings on diagnostic imaging of breast: Secondary | ICD-10-CM

## 2018-05-16 DIAGNOSIS — N6310 Unspecified lump in the right breast, unspecified quadrant: Secondary | ICD-10-CM

## 2018-05-17 ENCOUNTER — Ambulatory Visit (INDEPENDENT_AMBULATORY_CARE_PROVIDER_SITE_OTHER): Payer: Medicaid Other | Admitting: Obstetrics & Gynecology

## 2018-05-17 VITALS — BP 110/60 | Wt 154.0 lb

## 2018-05-17 DIAGNOSIS — Z3A37 37 weeks gestation of pregnancy: Secondary | ICD-10-CM

## 2018-05-17 DIAGNOSIS — Z3400 Encounter for supervision of normal first pregnancy, unspecified trimester: Secondary | ICD-10-CM

## 2018-05-17 DIAGNOSIS — Z3403 Encounter for supervision of normal first pregnancy, third trimester: Secondary | ICD-10-CM

## 2018-05-17 LAB — POCT URINALYSIS DIPSTICK OB
Glucose, UA: NEGATIVE
PROTEIN: NEGATIVE

## 2018-05-17 NOTE — Patient Instructions (Signed)
First Stage of Labor Labor is your body's natural process of moving your baby and other structures, including the placenta and umbilical cord, out of your uterus. There are three stages of labor. How long each stage lasts is different for every woman. But certain events happen during each stage that are the same for everyone.  The first stage starts when true labor begins. This stage ends when your cervix, which is the opening from your uterus into your vagina, is completely open (dilated).  The second stage begins when your cervix is fully dilated and you start pushing. This stage ends when your baby is born.  The third stage is the delivery of the organ that nourished your baby during pregnancy (placenta). First stage of labor As your due date gets closer, you may start to notice certain physical changes that mean labor is going to start soon. You may feel that your baby has dropped lower into your pelvis. You may experience irregular, often painless, contractions that go away when you walk around or lie down ([employer] contractions). This is also called false labor. The first stage of labor begins when you start having contractions that come at regular (evenly spaced) intervals and your cervix starts to get thinner and wider in preparation for your baby to pass through. Birth care providers measure the dilation of your cervix in centimeters (cm). One centimeter is a little less than one-half of an inch. The first stage ends when your cervix is dilated to 10 cm. The first stage of labor is divided into three phases:  Early phase.  Active phase.  Transitional phase. The length of the first stage of labor varies. It may be longer if this is your first pregnancy. You may spend most of this stage at home trying to relax and stay comfortable. How does this affect me? During the first stage of labor, you will move through three phases. What happens in the early phase?  You will start to have  regular contractions that last 30-60 seconds. Contractions may come every 5-20 minutes. Keep track of your contractions and call your birth care provider.  Your water may break during this phase.  You may notice a clear or slightly bloody discharge of mucus (mucus plug) from your vagina.  Your cervix will dilate to 3-6 cm. What happens in the active phase? The active phase usually lasts 3-5 hours. You may go to the hospital or birth center around this time. During the active phase:  Your contractions will become stronger, longer, and more uncomfortable.  Your contractions may last 45-90 seconds and come every 3-5 minutes.  You may feel lower back pain.  Your birth care providers may examine your cervix and feel your belly to find the position of your baby.  You may have a monitor strapped to your belly to measure your contractions and your baby's heart rate.  You may start using your pain management options.  Your cervix may be dilated to 6 cm and may start to dilate more quickly. What happens in the transitional phase? The transitional phase typically lasts from 30 minutes to 2 hours. At the end of this phase, your cervix will be fully dilated to 10 cm. During the transitional phase:  Contractions will get stronger and longer.  Contractions may last 60-90 seconds and come less than 2 minutes apart.  You may feel hot flashes, chills, or nausea. How does this affect my baby? During the first stage of labor, your baby will  gradually move down into your birth canal. °Follow these instructions at home and in the hospital or birth center: ° °· When labor first begins, try to stay calm. You are still in the early phase. If it is night, try to get some sleep. If it is day, try to relax and save your energy. You may want to make some calls and get ready to go to the hospital or birth center. °· When you are in the early phase, try these methods to help ease discomfort: °? Deep breathing and  muscle relaxation. °? Taking a walk. °? Taking a warm bath or shower. °· Drink some fluids and have a light snack if you feel like it. °· Keep track of your contractions. °· Based on the plan you created with your birth care provider, call when your contractions indicate it is time. °· If your water breaks, note the time, color, and odor of the fluid. °· When you are in the active phase, do your breathing exercises and rely on your support people and your team of birth care providers. °Contact a health care provider if: °· Your contractions are strong and regular. °· You have lower back pain or cramping. °· Your water breaks. °· You lose your mucus plug. °Get help right away if you: °· Have a severe headache that does not go away. °· Have changes in your vision. °· Have severe pain in your upper belly. °· Do not feel the baby move. °· Have bright red bleeding. °Summary °· The first stage of labor starts when true labor begins, and it ends when your cervix is dilated to 10 cm. °· The first stage of labor has three phases: early, active, and transitional. °· Your baby moves into the birth canal during the first stage of labor. °· You may have contractions that become stronger and longer. You may also lose your mucus plug and have your water break. °· Call your birth care provider when your contractions are frequent and strong enough to go to the hospital or birth center. °This information is not intended to replace advice given to you by your health care provider. Make sure you discuss any questions you have with your health care provider. °Document Released: 05/20/2017 Document Revised: 11/24/2017 Document Reviewed: 05/20/2017 °Elsevier Interactive Patient Education © 2019 [employer] ° °

## 2018-05-17 NOTE — Progress Notes (Signed)
  Subjective  Fetal Movement? yes Contractions? no Leaking Fluid? no Vaginal Bleeding? no  Objective  BP 110/60   Wt 154 lb (69.9 kg)   LMP 08/31/2017 (Exact Date)   BMI 27.28 kg/m  General: NAD Pumonary: no increased work of breathing Abdomen: gravid, non-tender Extremities: no edema Psychiatric: mood appropriate, affect full  Assessment  28 y.o. G1P0 at [redacted]w[redacted]d by  06/07/2018, by Last Menstrual Period presenting for routine prenatal visit  Plan   Problem List Items Addressed This Visit      Other   Supervision of normal first pregnancy, antepartum    Other Visit Diagnoses    [redacted] weeks gestation of pregnancy    -  Primary    PNV, [place], Labor precautions  Barnett Applebaum, MD, [facility], [facility] 05/17/2018  3:20 PM

## 2018-05-17 NOTE — Addendum Note (Signed)
Addended by: Quintella Baton D on: 05/17/2018 03:31 PM   Modules accepted: Orders

## 2018-05-21 ENCOUNTER — Ambulatory Visit
Admission: RE | Admit: 2018-05-21 | Discharge: 2018-05-21 | Disposition: A | Discharge status: Home / Self Care | Payer: Medicaid Other | Source: Ambulatory Visit | Attending: Maternal Newborn | Admitting: Maternal Newborn

## 2018-05-21 DIAGNOSIS — N6310 Unspecified lump in the right breast, unspecified quadrant: Secondary | ICD-10-CM

## 2018-05-21 DIAGNOSIS — R928 Other abnormal and inconclusive findings on diagnostic imaging of breast: Secondary | ICD-10-CM

## 2018-05-21 DIAGNOSIS — N6021 Fibroadenosis of right breast: Secondary | ICD-10-CM | POA: Insufficient documentation

## 2018-05-22 LAB — SURGICAL PATHOLOGY

## 2018-05-24 ENCOUNTER — Ambulatory Visit (INDEPENDENT_AMBULATORY_CARE_PROVIDER_SITE_OTHER): Payer: Medicaid Other | Admitting: Obstetrics & Gynecology

## 2018-05-24 VITALS — BP 110/80 | Wt 155.0 lb

## 2018-05-24 DIAGNOSIS — Z3A38 38 weeks gestation of pregnancy: Secondary | ICD-10-CM

## 2018-05-24 DIAGNOSIS — Z3400 Encounter for supervision of normal first pregnancy, unspecified trimester: Secondary | ICD-10-CM

## 2018-05-24 DIAGNOSIS — Z3403 Encounter for supervision of normal first pregnancy, third trimester: Secondary | ICD-10-CM

## 2018-05-24 LAB — POCT URINALYSIS DIPSTICK OB
Glucose, UA: NEGATIVE
POC,PROTEIN,UA: NEGATIVE

## 2018-05-24 NOTE — Progress Notes (Signed)
  Subjective  Fetal Movement? yes Contractions? no Leaking Fluid? no Vaginal Bleeding? no  Objective  BP 110/80   Wt 155 lb (70.3 kg)   LMP 08/31/2017 (Exact Date)   BMI 27.46 kg/m  General: NAD Pumonary: no increased work of breathing Abdomen: gravid, non-tender Extremities: no edema Psychiatric: mood appropriate, affect full  Assessment  28 y.o. G1P0 at [redacted]w[redacted]d by  06/07/2018, by Last Menstrual Period presenting for routine prenatal visit  Plan   Problem List Items Addressed This Visit      Other   Supervision of normal first pregnancy, antepartum    Other Visit Diagnoses    [redacted] weeks gestation of pregnancy    -  Primary   Relevant Orders   POC Urinalysis Dipstick OB (Completed)    [facility],Labor precautions Favorable cervix, discussed IOL after 16 weeks   Barnett Applebaum, MD, [facility], [facility] 05/24/2018  3:28 PM

## 2018-05-25 ENCOUNTER — Other Ambulatory Visit: Payer: Self-pay

## 2018-05-25 ENCOUNTER — Observation Stay
Admission: EM | Admit: 2018-05-25 | Discharge: 2018-05-25 | Disposition: A | Discharge status: Home / Self Care | Payer: Medicaid Other | Attending: Obstetrics and Gynecology | Admitting: Obstetrics and Gynecology

## 2018-05-25 ENCOUNTER — Observation Stay
Admission: EM | Admit: 2018-05-25 | Discharge: 2018-05-25 | Disposition: A | Discharge status: Home / Self Care | Payer: Medicaid Other | Source: Home / Self Care | Admitting: Obstetrics and Gynecology

## 2018-05-25 ENCOUNTER — Encounter: Payer: Self-pay | Admitting: *Deleted

## 2018-05-25 DIAGNOSIS — O99333 Smoking (tobacco) complicating pregnancy, third trimester: Secondary | ICD-10-CM | POA: Insufficient documentation

## 2018-05-25 DIAGNOSIS — O26853 Spotting complicating pregnancy, third trimester: Secondary | ICD-10-CM | POA: Diagnosis not present

## 2018-05-25 DIAGNOSIS — F17210 Nicotine dependence, cigarettes, uncomplicated: Secondary | ICD-10-CM | POA: Insufficient documentation

## 2018-05-25 DIAGNOSIS — O4692 Antepartum hemorrhage, unspecified, second trimester: Secondary | ICD-10-CM

## 2018-05-25 DIAGNOSIS — Z3400 Encounter for supervision of normal first pregnancy, unspecified trimester: Secondary | ICD-10-CM

## 2018-05-25 DIAGNOSIS — O4693 Antepartum hemorrhage, unspecified, third trimester: Secondary | ICD-10-CM | POA: Insufficient documentation

## 2018-05-25 DIAGNOSIS — Z3A38 38 weeks gestation of pregnancy: Secondary | ICD-10-CM | POA: Insufficient documentation

## 2018-05-25 NOTE — Discharge Summary (Signed)
See final progress note. 

## 2018-05-25 NOTE — OB Triage Note (Signed)
Discharge home with significant other. Left floor ambulatory. Dineen Kid

## 2018-05-25 NOTE — Final Progress Note (Signed)
Physician Final Progress Note  Patient ID: Crystal Randall MRN: [id number] DOB/AGE: 10/15/1990 28 y.o.  Admit date: 05/25/2018 Admitting provider: Will Bonnet, MD Discharge date: 05/25/2018   Admission Diagnoses:  1) intrauterine pregnancy at [redacted]w[redacted]d 2) admission for non-stress test due non-reactive NST earlier today.  Discharge Diagnoses:  1) intrauterine pregnancy at [redacted]w[redacted]d 2) admission for non-stress test due non-reactive NST earlier today.  History of Present Illness: The patient is a 28 y.o. female G1P0 at [redacted]w[redacted]d who presents for a follow-up NST after presenting to L&D earlier this morning and leaving after a non-reactive NST. She notes more contractions since arriving, but nothing regular or frequent. She notes continued occasional vaginal spotting, no leakage of fluid. She notes +FM.   Past Medical History:  Diagnosis Date  . Asthma     Past Surgical History:  Procedure Laterality Date  . NO PAST SURGERIES      No current facility-administered medications on file prior to encounter.    Current Outpatient Medications on File Prior to Encounter  Medication Sig Dispense Refill  . albuterol (PROVENTIL HFA;VENTOLIN HFA) 108 (90 Base) MCG/ACT inhaler Inhale 2 puffs into the lungs every 6 (six) hours as needed for wheezing or shortness of breath. 1 Inhaler 1  . Prenatal Vit-Fe Fumarate-FA (PRENATAL PO) Take by mouth.      No Known Allergies  Social History   Socioeconomic History  . Marital status: Single    Spouse name: Not on file  . Number of children: Not on file  . Years of education: Not on file  . Highest education level: Not on file  Occupational History  . Not on file  Social Needs  . Financial resource strain: Not on file  . Food insecurity:    Worry: Not on file    Inability: Not on file  . Transportation needs:    Medical: Not on file    Non-medical: Not on file  Tobacco Use  . Smoking status: Current Every Day Smoker    Packs/day: 1.00    Years:  2.00    Pack years: 2.00    Types: Cigarettes  . Smokeless tobacco: Never Used  Substance and Sexual Activity  . Alcohol use: No  . Drug use: No  . Sexual activity: Not Currently  Lifestyle  . Physical activity:    Days per week: Not on file    Minutes per session: Not on file  . Stress: Not on file  Relationships  . Social connections:    Talks on phone: Not on file    Gets together: Not on file    Attends religious service: Not on file    Active member of club or organization: Not on file    Attends meetings of clubs or organizations: Not on file    Relationship status: Not on file  . Intimate partner violence:    Fear of current or ex partner: Not on file    Emotionally abused: Not on file    Physically abused: Not on file    Forced sexual activity: Not on file  Other Topics Concern  . Not on file  Social History Narrative  . Not on file   Family History: Denies history of gynecologic cancer  Review of Systems  Constitutional: Negative.   HENT: Negative.   Eyes: Negative.   Respiratory: Negative.   Cardiovascular: Negative.   Gastrointestinal: Negative.   Genitourinary: Negative.        See HPI  Musculoskeletal: Negative.  Skin: Negative.   Neurological: Negative.   Psychiatric/Behavioral: Negative.      Physical Exam: BP (!) 99/59 (BP Location: Left Arm)   Pulse 92   Temp 97.6 F (36.4 C) (Oral)   Resp 16   LMP 08/31/2017 (Exact Date)   Physical Exam Constitutional:      General: She is not in acute distress.    Appearance: Normal appearance.  Genitourinary:     Genitourinary Comments: SVE: 3/70/-2  HENT:     Head: Normocephalic and atraumatic.  Eyes:     General: No scleral icterus.    Conjunctiva/sclera: Conjunctivae normal.  Pulmonary:     Effort: Pulmonary effort is normal.  Abdominal:     Comments: Gravid, NT  Musculoskeletal: Normal range of motion.        General: No swelling.  Neurological:     General: No focal deficit present.      Mental Status: She is alert and oriented to person, place, and time.     Cranial Nerves: No cranial nerve deficit.  Skin:    General: Skin is warm and dry.  Psychiatric:        Mood and Affect: Mood normal.        Behavior: Behavior normal.        Judgment: Judgment normal.   Female chaperone present for pelvic exam:   Consults: None  Significant Findings/ Diagnostic Studies: n/a  Procedures:  NST: Baseline FHR: 150 beats/min Variability: moderate Accelerations: present Decelerations: absent Tocometry: occasional  Interpretation:  INDICATIONS: vaginal bleeding, assess labor RESULTS:  A NST procedure was performed with FHR monitoring and a normal baseline established, appropriate time of 20-40 minutes of evaluation, and accels >2 seen w 15x15 characteristics.  Results show a REACTIVE NST.    Hospital Course: The patient was admitted to Labor and Delivery Triage for observation. She had a reactive NST. Her cervical exam was slightly changed from yesterday. She was offered to stay for an additional 1-2 hours for a cervix re-check. However, she was having only infrequent contractions and no other issues.  So, elected to go home and await active labor from there with precautions to return reviewed.  She had originally noted vaginal spotting. But, no blood was noted on the glove after the cervical exam.  She had normal vital signs. She was, therefore, discharged in stable condition with routine follow up instructions.  Discharge Condition: stable  Disposition: Discharge disposition: 01-Home or Self Care       Diet: Regular diet  Discharge Activity: Activity as tolerated   Allergies as of 05/25/2018   No Known Allergies     Medication List    TAKE these medications   albuterol 108 (90 Base) MCG/ACT inhaler Commonly known as:  PROVENTIL HFA;VENTOLIN HFA Inhale 2 puffs into the lungs every 6 (six) hours as needed for wheezing or shortness of breath.   PRENATAL PO Take by  mouth.        Total time spent taking care of this patient: 30 minutes  Signed: Prentice Docker, MD  05/25/2018, 11:56 AM

## 2018-05-25 NOTE — Discharge Summary (Signed)
.   Physician Discharge Summary   Patient ID: Crystal Randall [id number] 28 y.o. June 07, 1990  Admit date: 05/25/2018  Discharge date and time: 05/25/2018  4:15 AM   Admitting Physician: Homero Fellers, MD   Discharge Physician: Adrian Prows MD  Admission Diagnoses: 38 wks preg contractions  Discharge Diagnoses: [redacted] weeks pregnant  Admission Condition: good  Discharged Condition: good  Indication for Admission: Vaginal spotting in [facility] Course: Patient was seen in triage for vaginal spotting after a cervical check. Reassurance about spotting  NST: 135 bpm baseline, moderate variability, accelerations present, no decelerations. Tocometer : irregular, patient discharged home in stable condition  Called patient 7:40 am, reviewed tracing and would like her to return for a NST today. She will be able to come at 11 or 12 o'clock.   Consults: None  Significant Diagnostic Studies: none  Treatments: none  Discharge Exam: BP 104/63 (BP Location: Left Arm)   Pulse 94   Temp 97.9 F (36.6 C) (Oral)   Resp 18   Ht 5\' 3"  (1.6 m)   Wt 69.9 kg   LMP 08/31/2017 (Exact Date)   BMI 27.28 kg/m   General Appearance:    Alert, cooperative, no distress, appears stated age  Head:    Normocephalic, without obvious abnormality, atraumatic  Eyes:    PERRL, conjunctiva/corneas clear, EOM's intact, fundi    benign, both eyes  Ears:    Normal TM's and external ear canals, both ears  Nose:   Nares normal, septum midline, mucosa normal, no drainage    or sinus tenderness  Throat:   Lips, mucosa, and tongue normal; teeth and gums normal  Neck:   Supple, symmetrical, trachea midline, no adenopathy;    thyroid:  no enlargement/tenderness/nodules; no carotid   bruit or JVD  Back:     Symmetric, no curvature, ROM normal, no CVA tenderness  Lungs:     Clear to auscultation bilaterally, respirations unlabored  Chest Wall:    No tenderness or deformity   Heart:    Regular rate and  rhythm, S1 and S2 normal, no murmur, rub   or gallop  Breast Exam:    No tenderness, masses, or nipple abnormality  Abdomen:     Soft, non-tender, bowel sounds active all four quadrants,    no masses, no organomegaly  Genitalia:    Normal female without lesion, discharge or tenderness  Rectal:    Normal tone, normal prostate, no masses or tenderness;   guaiac negative stool  Extremities:   Extremities normal, atraumatic, no cyanosis or edema  Pulses:   2+ and symmetric all extremities  Skin:   Skin color, texture, turgor normal, no rashes or lesions  Lymph nodes:   Cervical, supraclavicular, and axillary nodes normal  Neurologic:   CNII-XII intact, normal strength, sensation and reflexes    throughout    Disposition:   Patient Instructions:  Allergies as of 05/25/2018   No Known Allergies     Medication List    ASK your doctor about these medications   albuterol 108 (90 Base) MCG/ACT inhaler Commonly known as:  PROVENTIL HFA;VENTOLIN HFA Inhale 2 puffs into the lungs every 6 (six) hours as needed for wheezing or shortness of breath.   PRENATAL PO Take by mouth.      Activity: activity as tolerated Diet: regular diet Wound Care: none needed  Follow-up with [facility] in 1 week.  Signed: Homero Fellers 05/25/2018 7:27 AM

## 2018-05-25 NOTE — OB Triage Note (Signed)
Pt G1P0 presents at [redacted]w[redacted]d with complaints of bleeding. Pt was checked in the office yesterday and has had some bloody show since then. Pt reports pad around 1am had more bleeding than before. Pt reports bleeding has been dark red/brown. +FM. No other discharge. occ CTX. VSS. Monitors applied

## 2018-05-26 ENCOUNTER — Observation Stay
Admission: EM | Admit: 2018-05-26 | Discharge: 2018-05-26 | Disposition: A | Discharge status: Home / Self Care | Payer: Medicaid Other | Attending: Obstetrics and Gynecology | Admitting: Obstetrics and Gynecology

## 2018-05-26 ENCOUNTER — Other Ambulatory Visit: Payer: Self-pay

## 2018-05-26 DIAGNOSIS — Z3A38 38 weeks gestation of pregnancy: Secondary | ICD-10-CM | POA: Diagnosis not present

## 2018-05-26 DIAGNOSIS — R109 Unspecified abdominal pain: Secondary | ICD-10-CM | POA: Insufficient documentation

## 2018-05-26 DIAGNOSIS — O99333 Smoking (tobacco) complicating pregnancy, third trimester: Secondary | ICD-10-CM | POA: Diagnosis not present

## 2018-05-26 DIAGNOSIS — O26893 Other specified pregnancy related conditions, third trimester: Secondary | ICD-10-CM | POA: Diagnosis not present

## 2018-05-26 DIAGNOSIS — F17210 Nicotine dependence, cigarettes, uncomplicated: Secondary | ICD-10-CM | POA: Insufficient documentation

## 2018-05-26 DIAGNOSIS — Z3490 Encounter for supervision of normal pregnancy, unspecified, unspecified trimester: Secondary | ICD-10-CM

## 2018-05-26 NOTE — Final Progress Note (Signed)
Physician Final Progress Note  Patient ID: Crystal Randall MRN: [id number] DOB/AGE: Mar 21, 1990 28 y.o.  Admit date: 05/26/2018 Admitting provider: Will Bonnet, MD Discharge date: 05/26/2018   Admission Diagnoses:  1) intrauterine pregnancy at [redacted]w[redacted]d  2) abdominal pain, third trimester  Discharge Diagnoses:  1) intrauterine pregnancy at [redacted]w[redacted]d  2) abdominal pain, third trimester, false labor  History of Present Illness: The patient is a 28 y.o. female G1P0 at [redacted]w[redacted]d who presents for worsening contractions since yesterday. She notes no vaginal bleeding, no leaking of fluid. She notes +FM.   Past Medical History:  Diagnosis Date  . Asthma     Past Surgical History:  Procedure Laterality Date  . NO PAST SURGERIES      No current facility-administered medications on file prior to encounter.    Current Outpatient Medications on File Prior to Encounter  Medication Sig Dispense Refill  . albuterol (PROVENTIL HFA;VENTOLIN HFA) 108 (90 Base) MCG/ACT inhaler Inhale 2 puffs into the lungs every 6 (six) hours as needed for wheezing or shortness of breath. 1 Inhaler 1  . Prenatal Vit-Fe Fumarate-FA (PRENATAL PO) Take by mouth.      No Known Allergies  Social History   Socioeconomic History  . Marital status: Single    Spouse name: Not on file  . Number of children: Not on file  . Years of education: Not on file  . Highest education level: Not on file  Occupational History  . Not on file  Social Needs  . Financial resource strain: Not on file  . Food insecurity:    Worry: Not on file    Inability: Not on file  . Transportation needs:    Medical: Not on file    Non-medical: Not on file  Tobacco Use  . Smoking status: Current Every Day Smoker    Packs/day: 1.00    Years: 2.00    Pack years: 2.00    Types: Cigarettes  . Smokeless tobacco: Never Used  Substance and Sexual Activity  . Alcohol use: No  . Drug use: No  . Sexual activity: Not Currently  Lifestyle  .  Physical activity:    Days per week: Not on file    Minutes per session: Not on file  . Stress: Not on file  Relationships  . Social connections:    Talks on phone: Not on file    Gets together: Not on file    Attends religious service: Not on file    Active member of club or organization: Not on file    Attends meetings of clubs or organizations: Not on file    Relationship status: Not on file  . Intimate partner violence:    Fear of current or ex partner: Not on file    Emotionally abused: Not on file    Physically abused: Not on file    Forced sexual activity: Not on file  Other Topics Concern  . Not on file  Social History Narrative  . Not on file   Family History: denies history of gynecologic cancer  Review of Systems  Constitutional: Negative.   HENT: Negative.   Eyes: Negative.   Respiratory: Negative.   Cardiovascular: Negative.   Gastrointestinal: Positive for abdominal pain (see HPI). Negative for blood in stool, constipation, diarrhea, heartburn, melena, nausea and vomiting.  Genitourinary: Negative.   Musculoskeletal: Negative.   Skin: Negative.   Neurological: Negative.   Psychiatric/Behavioral: Negative.      Physical Exam: BP 109/65 (BP Location: Left  Arm)   Pulse 87   Temp 98 F (36.7 C) (Oral)   Resp 16   LMP 08/31/2017 (Exact Date)   Physical Exam Constitutional:      General: She is not in acute distress.    Appearance: Normal appearance.  HENT:     Head: Normocephalic and atraumatic.  Eyes:     General: No scleral icterus.    Conjunctiva/sclera: Conjunctivae normal.  Neurological:     General: No focal deficit present.     Mental Status: She is alert and oriented to person, place, and time.     Cranial Nerves: No cranial nerve deficit.  Psychiatric:        Mood and Affect: Mood normal.        Behavior: Behavior normal.        Judgment: Judgment normal.   Pelvic exam: 3.5 cm over two check greater than 2 hours apart, per  RN  Consults: None  Significant Findings/ Diagnostic Studies: n/a  Procedures:  NST: Baseline FHR: 140 beats/min Variability: moderate Accelerations: present Decelerations: absent Tocometry: infrequent  Interpretation:  INDICATIONS: rule out uterine contractions RESULTS:  A NST procedure was performed with FHR monitoring and a normal baseline established, appropriate time of 20-40 minutes of evaluation, and accels >2 seen w 15x15 characteristics.  Results show a REACTIVE NST.    Hospital Course: The patient was admitted to Labor and Delivery Triage for observation. She had normal vital signs. She had a reactive NST. She had a stable cervical exam over two checks. As she was not laboring, she was discharged with the usual term pregnancy precautions.  Discharge Condition: stable  Disposition: Discharge disposition: 01-Home or Self Care       Diet: Regular diet  Discharge Activity: Activity as tolerated   Allergies as of 05/26/2018   No Known Allergies     Medication List    TAKE these medications   albuterol 108 (90 Base) MCG/ACT inhaler Commonly known as:  PROVENTIL HFA;VENTOLIN HFA Inhale 2 puffs into the lungs every 6 (six) hours as needed for wheezing or shortness of breath.   PRENATAL PO Take by mouth.        Total time spent taking care of this patient: 20 minutes  Signed: Prentice Docker, MD  05/26/2018, 11:06 PM

## 2018-05-26 NOTE — OB Triage Note (Signed)
Pt states CTX started yesterday AM 10/25/2018, have gotten worse this evening after a walk.  No LOF.  Reports normal fetal movement  and bloody show.

## 2018-05-26 NOTE — Discharge Summary (Signed)
See final progress note. 

## 2018-05-29 ENCOUNTER — Observation Stay
Admission: EM | Admit: 2018-05-29 | Discharge: 2018-05-29 | Disposition: A | Discharge status: Home / Self Care | Payer: Medicaid Other | Attending: Certified Nurse Midwife | Admitting: Certified Nurse Midwife

## 2018-05-29 ENCOUNTER — Other Ambulatory Visit: Payer: Self-pay

## 2018-05-29 DIAGNOSIS — Z3A38 38 weeks gestation of pregnancy: Secondary | ICD-10-CM | POA: Insufficient documentation

## 2018-05-29 DIAGNOSIS — O4692 Antepartum hemorrhage, unspecified, second trimester: Secondary | ICD-10-CM

## 2018-05-29 DIAGNOSIS — O471 False labor at or after 37 completed weeks of gestation: Principal | ICD-10-CM | POA: Insufficient documentation

## 2018-05-29 DIAGNOSIS — Z3400 Encounter for supervision of normal first pregnancy, unspecified trimester: Secondary | ICD-10-CM

## 2018-05-29 NOTE — Discharge Instructions (Signed)
Please return to labor and delivery for strong contractions every 3-5 minutes for 1-2 hours, leaking water from the vagina, bleeding like a period or decreased fetal movement.

## 2018-05-29 NOTE — Final Progress Note (Signed)
Physician Final Progress Note  Patient ID: Crystal Randall MRN: [id number] DOB/AGE: Jul 20, 1990 27 y.o.  Admit date: 05/29/2018 Admitting provider: Will Bonnet, MD/ Crystal Randall. Crystal Randall, CNM Discharge date: 05/29/2018   Admission Diagnoses: IUP at 38wk5d with contractions  Discharge Diagnoses:  Active Problems: IUP at 38wk5d   False labor after 37 completed weeks of gestation   Consults: None  Significant Findings/ Diagnostic Studies: The patient is a 28 y.o. female G1P0 with EDC=06/07/2018 at [redacted]w[redacted]d who presents for worsening contractions since yesterday. Her contractions are every 5-7 minutes and she rates them a 7/10. She notes a pink mucous discharge but denies leaking of fluid. This is the third visit to triage in the past 4 days for contractions. Was 3.5cm/ 60-70% when discharged 05/26/2018. She notes +FM.  Her prenatal care has been remarkable only for an episode of vaginal bleeding/ spotting at 20 weeks. Has a history of mild asthma.  Clinic [facility] Prenatal Labs  Dating LMP =9wk [place] Blood type: B/Positive/-- (08/09 1604)   Genetic Screen   NIPS: normal XX Antibody:Negative (08/09 1604)  Anatomic [place]  complete Rubella: <0.90 (08/09 1604) Varicella: Immune  GTT  Third trimester: 84 RPR: Non Reactive (08/09 1604)   Rhogam  not needed HBsAg: Negative (08/09 1604)   TDaP vaccine    Declines                    Flu Shot:12/28/17 HIV: Non Reactive (08/09 1604)   Baby Food  Breast                              GBS:  negative  Contraception  Pap: Crystal need postpartum  CBB     CS/VBAC NA   Support Person Crystal Randall    EXAM: BP 106/67 (BP Location: Right Arm)   Pulse 97   Temp 98.3 F (36.8 C) (Oral)   Resp 15   Ht 5\' 3"  (1.6 m)   Wt 69.9 kg   LMP 08/31/2017 (Exact Date)   BMI 27.28 kg/m   General: appears very comfortable Heart: Regular rate Lungs: normal respiratory effort Abdomen: mild contractions palpated, vertex on Leopold's Cervix: 3.5/60-70% by RN exam on arrival and  3cm/80%/-1 by my exam 2 hours later.  FHR: 135-140 with acceleration to 160s to 170s, moderate varaibility Toco: irregular contractions, varying in intensity and duration  Ultrasound: AFI= 20.6cm+ 32.4MW+10.2VO=5.36UY Cephalic presentation/ anterior placenta  A: IUP at 38wk5d with false labor  P: Home with labor precautions Follow up in 2 days for ROB if NIL Offered IOL after 39 weeks and patient declines at this time. Procedures: none  Discharge Condition: stable  Disposition: Discharge disposition: 01-Home or Self Care       Diet: Regular diet, increase water intake to six 8oz glasses/ day.  Discharge Activity: Activity as tolerated  Discharge Instructions    Discharge patient   Complete by:  As directed    Discharge disposition:  01-Home or Self Care   Discharge patient date:  05/29/2018     Allergies as of 05/29/2018   No Known Allergies     Medication List    TAKE these medications   albuterol 108 (90 Base) MCG/ACT inhaler Commonly known as:  PROVENTIL HFA;VENTOLIN HFA Inhale 2 puffs into the lungs every 6 (six) hours as needed for wheezing or shortness of breath.   PRENATAL PO Take by mouth.        Total time  spent taking care of this patient: 20 minutes  Signed: Dalia Randall 05/29/2018, 8:42 PM

## 2018-05-29 NOTE — OB Triage Note (Signed)
Pt is a 28yo G1P0 at [redacted]w[redacted]d that presents from the ED with c/o of ctx. Pt states she has had ctx for the past 3 days but they have been more intense since yesterday. Pt states Ctx are 5-7 minutes apart and last for around a minute. Pt rates ctx pain 7/10. Pt denies LOF but c/o bloody show since Saturday and pt states her mucous plug came out Monday.Pt states positive FM and initial FHT 145 with monitors applied and assessing. Vitals [facility] will continue to monitor.

## 2018-05-31 ENCOUNTER — Other Ambulatory Visit: Payer: Self-pay

## 2018-05-31 ENCOUNTER — Ambulatory Visit (INDEPENDENT_AMBULATORY_CARE_PROVIDER_SITE_OTHER): Payer: Medicaid Other | Admitting: Obstetrics & Gynecology

## 2018-05-31 VITALS — BP 120/80 | Wt 154.0 lb

## 2018-05-31 DIAGNOSIS — Z3400 Encounter for supervision of normal first pregnancy, unspecified trimester: Secondary | ICD-10-CM

## 2018-05-31 DIAGNOSIS — Z3403 Encounter for supervision of normal first pregnancy, third trimester: Secondary | ICD-10-CM

## 2018-05-31 DIAGNOSIS — Z3A39 39 weeks gestation of pregnancy: Secondary | ICD-10-CM

## 2018-05-31 NOTE — Patient Instructions (Signed)
Labor Induction  Labor induction is when steps are taken to cause a pregnant woman to begin the labor process. Most women go into labor on their own between 37 weeks and 42 weeks of pregnancy. When this does not happen or when there is a medical need for labor to begin, steps may be taken to induce labor. Labor induction causes a pregnant woman's uterus to contract. It also causes the cervix to soften (ripen), open (dilate), and thin out (efface). Usually, labor is not induced before 39 weeks of pregnancy unless there is a medical reason to do so. Your health care provider will determine if labor induction is needed. Before inducing labor, your health care provider will consider a number of factors, including:  Your medical condition and your baby's.  How many weeks along you are in your pregnancy.  How mature your baby's lungs are.  The condition of your cervix.  The position of your baby.  The size of your birth canal. What are some reasons for labor induction? Labor may be induced if:  Your health or your baby's health is at risk.  Your pregnancy is overdue by 1 week or more.  Your water breaks but labor does not start on its own.  There is a low amount of amniotic fluid around your baby. You may also choose (elect) to have labor induced at a certain time. Generally, elective labor induction is done no earlier than 39 weeks of pregnancy. What methods are used for labor induction? Methods used for labor induction include:  Prostaglandin medicine. This medicine starts contractions and causes the cervix to dilate and ripen. It can be taken by mouth (orally) or by being inserted into the vagina (suppository).  Inserting a small, thin tube (catheter) with a balloon into the vagina and then expanding the balloon with water to dilate the cervix.  Stripping the membranes. In this method, your health care provider gently separates amniotic sac tissue from the cervix. This causes the  cervix to stretch, which in turn causes the release of a hormone called progesterone. The hormone causes the uterus to contract. This procedure is often done during an office visit, after which you will be sent home to wait for contractions to begin.  Breaking the water. In this method, your health care provider uses a small instrument to make a small hole in the amniotic sac. This eventually causes the amniotic sac to break. Contractions should begin after a few hours.  Medicine to trigger or strengthen contractions. This medicine is given through an IV that is inserted into a vein in your arm. Except for membrane stripping, which can be done in a clinic, labor induction is done in the hospital so that you and your baby can be carefully monitored. How long does it take for labor to be induced? The length of time it takes to induce labor depends on how ready your body is for labor. Some inductions can take up to 2-3 days, while others may take less than a day. Induction may take longer if:  You are induced early in your pregnancy.  It is your first pregnancy.  Your cervix is not ready. What are some risks associated with labor induction? Some risks associated with labor induction include:  Changes in fetal heart rate, such as being too high, too low, or irregular (erratic).  Failed induction.  Infection in the mother or the baby.  Increased risk of having a cesarean delivery.  Fetal death.  Breaking off (abruption)   high, too low, or irregular (erratic).  · Failed induction.  · Infection in the mother or the baby.  · Increased risk of having a cesarean delivery.  · Fetal death.  · Breaking off (abruption) of the placenta from the uterus (rare).  · Rupture of the uterus (very rare).  When induction is needed for medical reasons, the benefits of induction generally outweigh the risks.  What are some reasons for not inducing labor?  Labor induction should not be done if:  · Your baby does not tolerate contractions.  · You have had previous surgeries on your uterus, such as a myomectomy, removal of fibroids, or a vertical scar from a previous cesarean delivery.  · Your placenta lies  very low in your uterus and blocks the opening of the cervix (placenta previa).  · Your baby is not in a head-down position.  · The umbilical cord drops down into the birth canal in front of the baby.  · There are unusual circumstances, such as the baby being very early (premature).  · You have had more than 2 previous cesarean deliveries.  Summary  · Labor induction is when steps are taken to cause a pregnant woman to begin the labor process.  · Labor induction causes a pregnant woman's uterus to contract. It also causes the cervix to ripen, dilate, and efface.  · Labor is not induced before 39 weeks of pregnancy unless there is a medical reason to do so.  · When induction is needed for medical reasons, the benefits of induction generally outweigh the risks.  This information is not intended to replace advice given to you by your health care provider. Make sure you discuss any questions you have with your health care provider.  Document Released: 07/26/2006 Document Revised: 04/19/2016 Document Reviewed: 04/19/2016  [employer] Interactive Patient Education © 2019 [employer]

## 2018-05-31 NOTE — Progress Notes (Signed)
  Subjective  Fetal Movement? yes Contractions? None today; has had days of mild ctxs    Seen in L&D twice this week for false labor Leaking Fluid? no Vaginal Bleeding? no  Objective  BP 120/80   Wt 154 lb (69.9 kg)   LMP 08/31/2017 (Exact Date)   BMI 27.28 kg/m  General: NAD Pumonary: no increased work of breathing Abdomen: gravid, non-tender Extremities: no edema Psychiatric: mood appropriate, affect full SVE- 4/80/-3, Vtx Membranes swept today Assessment  28 y.o. G1P0 at [redacted]w[redacted]d by  06/07/2018, by Last Menstrual Period presenting for routine prenatal visit  Plan   Problem List Items Addressed This Visit      Other   Supervision of normal first pregnancy, antepartum   Pregnancy - Primary    IOL 3/19 if still pregnant Labor precautions discussed  Barnett Applebaum, MD, [facility], [facility] 05/31/2018  3:46 PM

## 2018-06-04 ENCOUNTER — Other Ambulatory Visit: Payer: Self-pay

## 2018-06-04 ENCOUNTER — Inpatient Hospital Stay: Payer: Medicaid Other | Admitting: Anesthesiology

## 2018-06-04 ENCOUNTER — Inpatient Hospital Stay
Admission: EM | Admit: 2018-06-04 | Discharge: 2018-06-05 | DRG: 807 | Disposition: A | Discharge status: Home / Self Care | Payer: Medicaid Other | Attending: Obstetrics and Gynecology | Admitting: Obstetrics and Gynecology

## 2018-06-04 DIAGNOSIS — Z3400 Encounter for supervision of normal first pregnancy, unspecified trimester: Secondary | ICD-10-CM

## 2018-06-04 DIAGNOSIS — O9952 Diseases of the respiratory system complicating childbirth: Secondary | ICD-10-CM | POA: Diagnosis present

## 2018-06-04 DIAGNOSIS — O9902 Anemia complicating childbirth: Principal | ICD-10-CM | POA: Diagnosis present

## 2018-06-04 DIAGNOSIS — Z3A39 39 weeks gestation of pregnancy: Secondary | ICD-10-CM

## 2018-06-04 DIAGNOSIS — O26893 Other specified pregnancy related conditions, third trimester: Secondary | ICD-10-CM | POA: Diagnosis present

## 2018-06-04 DIAGNOSIS — D649 Anemia, unspecified: Secondary | ICD-10-CM | POA: Diagnosis present

## 2018-06-04 DIAGNOSIS — J45909 Unspecified asthma, uncomplicated: Secondary | ICD-10-CM | POA: Diagnosis present

## 2018-06-04 DIAGNOSIS — O4692 Antepartum hemorrhage, unspecified, second trimester: Secondary | ICD-10-CM

## 2018-06-04 LAB — CBC
HCT: 29.1 % — ABNORMAL LOW (ref 36.0–46.0)
HEMOGLOBIN: 9 g/dL — AB (ref 12.0–15.0)
MCH: 22 pg — ABNORMAL LOW (ref 26.0–34.0)
MCHC: 30.9 g/dL (ref 30.0–36.0)
MCV: 71.1 fL — ABNORMAL LOW (ref 80.0–100.0)
Platelets: 288 10*3/uL (ref 150–400)
RBC: 4.09 MIL/uL (ref 3.87–5.11)
RDW: 14.6 % (ref 11.5–15.5)
WBC: 15.1 10*3/uL — ABNORMAL HIGH (ref 4.0–10.5)
nRBC: 0 % (ref 0.0–0.2)

## 2018-06-04 LAB — TYPE AND SCREEN
ABO/RH(D): B POS
Antibody Screen: NEGATIVE

## 2018-06-04 MED ORDER — ONDANSETRON HCL 4 MG/2ML IJ SOLN: 4.0000 mg | INTRAMUSCULAR | Status: DC | PRN

## 2018-06-04 MED ORDER — LACTATED RINGERS IV SOLN
INTRAVENOUS | Status: DC
  Administered 2018-06-04: 02:00:00 via INTRAVENOUS

## 2018-06-04 MED ORDER — TETANUS-DIPHTH-ACELL PERTUSSIS 5-2.5-18.5 LF-MCG/0.5 IM SUSP
0.5000 mL | Freq: Once | INTRAMUSCULAR | Status: DC
  Filled 2018-06-04: qty 0.5

## 2018-06-04 MED ORDER — PRENATAL MULTIVITAMIN CH
1.0000 | ORAL_TABLET | Freq: Every day | ORAL | Status: DC
  Administered 2018-06-04 – 2018-06-05 (×2): 1 via ORAL
  Filled 2018-06-04 (×2): qty 1

## 2018-06-04 MED ORDER — AMMONIA AROMATIC IN INHA
RESPIRATORY_TRACT | Status: AC
  Filled 2018-06-04: qty 10

## 2018-06-04 MED ORDER — DIBUCAINE 1 % RE OINT: 1.0000 "application " | TOPICAL_OINTMENT | RECTAL | Status: DC | PRN

## 2018-06-04 MED ORDER — PHENYLEPHRINE 40 MCG/ML (10ML) SYRINGE FOR IV PUSH (FOR BLOOD PRESSURE SUPPORT): 80.0000 ug | PREFILLED_SYRINGE | INTRAVENOUS | Status: DC | PRN

## 2018-06-04 MED ORDER — OXYTOCIN BOLUS FROM INFUSION
500.0000 mL | Freq: Once | INTRAVENOUS | Status: AC
  Administered 2018-06-04: 500 mL via INTRAVENOUS

## 2018-06-04 MED ORDER — ONDANSETRON HCL 4 MG PO TABS: 4.0000 mg | ORAL_TABLET | ORAL | Status: DC | PRN

## 2018-06-04 MED ORDER — LACTATED RINGERS IV SOLN
INTRAVENOUS | Status: DC
  Administered 2018-06-04: 06:00:00 via INTRAVENOUS

## 2018-06-04 MED ORDER — MISOPROSTOL 200 MCG PO TABS
ORAL_TABLET | ORAL | Status: AC
  Filled 2018-06-04: qty 4

## 2018-06-04 MED ORDER — FENTANYL 2.5 MCG/ML W/ROPIVACAINE 0.15% IN NS 100 ML EPIDURAL (ARMC)
EPIDURAL | Status: AC
  Filled 2018-06-04: qty 100

## 2018-06-04 MED ORDER — LIDOCAINE HCL (PF) 1 % IJ SOLN: 30.0000 mL | INTRAMUSCULAR | Status: DC | PRN

## 2018-06-04 MED ORDER — ACETAMINOPHEN 325 MG PO TABS: 650.0000 mg | ORAL_TABLET | ORAL | Status: DC | PRN

## 2018-06-04 MED ORDER — LACTATED RINGERS IV SOLN: 500.0000 mL | INTRAVENOUS | Status: DC | PRN

## 2018-06-04 MED ORDER — DIPHENHYDRAMINE HCL 25 MG PO CAPS: 25.0000 mg | ORAL_CAPSULE | Freq: Four times a day (QID) | ORAL | Status: DC | PRN

## 2018-06-04 MED ORDER — SIMETHICONE 80 MG PO CHEW: 80.0000 mg | CHEWABLE_TABLET | ORAL | Status: DC | PRN

## 2018-06-04 MED ORDER — FENTANYL 2.5 MCG/ML W/ROPIVACAINE 0.15% IN NS 100 ML EPIDURAL (ARMC)
12.0000 mL/h | EPIDURAL | Status: DC
  Administered 2018-06-04: 12 mL/h via EPIDURAL

## 2018-06-04 MED ORDER — COCONUT OIL OIL
1.0000 "application " | TOPICAL_OIL | Status: DC | PRN
  Filled 2018-06-04: qty 120

## 2018-06-04 MED ORDER — EPHEDRINE 5 MG/ML INJ: 10.0000 mg | INTRAVENOUS | Status: DC | PRN

## 2018-06-04 MED ORDER — DIPHENHYDRAMINE HCL 50 MG/ML IJ SOLN: 12.5000 mg | INTRAMUSCULAR | Status: DC | PRN

## 2018-06-04 MED ORDER — OXYTOCIN 40 UNITS IN NORMAL SALINE INFUSION - SIMPLE MED
2.5000 [IU]/h | INTRAVENOUS | Status: DC
  Filled 2018-06-04: qty 1000

## 2018-06-04 MED ORDER — LIDOCAINE HCL (PF) 1 % IJ SOLN
INTRAMUSCULAR | Status: DC | PRN
  Administered 2018-06-04: 1 mL via INTRADERMAL

## 2018-06-04 MED ORDER — LACTATED RINGERS IV SOLN: 500.0000 mL | Freq: Once | INTRAVENOUS | Status: DC

## 2018-06-04 MED ORDER — FENTANYL CITRATE (PF) 100 MCG/2ML IJ SOLN: 50.0000 ug | INTRAMUSCULAR | Status: DC | PRN

## 2018-06-04 MED ORDER — OXYTOCIN 10 UNIT/ML IJ SOLN
INTRAMUSCULAR | Status: AC
  Filled 2018-06-04: qty 2

## 2018-06-04 MED ORDER — SODIUM CHLORIDE 0.9 % IV SOLN
INTRAVENOUS | Status: DC | PRN
  Administered 2018-06-04 (×2): 5 mL via EPIDURAL

## 2018-06-04 MED ORDER — IBUPROFEN 600 MG PO TABS
600.0000 mg | ORAL_TABLET | Freq: Four times a day (QID) | ORAL | Status: DC
  Administered 2018-06-04 – 2018-06-05 (×5): 600 mg via ORAL
  Filled 2018-06-04 (×5): qty 1

## 2018-06-04 MED ORDER — ACETAMINOPHEN 325 MG PO TABS
650.0000 mg | ORAL_TABLET | ORAL | Status: DC | PRN
  Administered 2018-06-04: 650 mg via ORAL
  Filled 2018-06-04: qty 2

## 2018-06-04 MED ORDER — WITCH HAZEL-GLYCERIN EX PADS: 1.0000 "application " | MEDICATED_PAD | CUTANEOUS | Status: DC | PRN

## 2018-06-04 MED ORDER — BENZOCAINE-MENTHOL 20-0.5 % EX AERO
1.0000 "application " | INHALATION_SPRAY | CUTANEOUS | Status: DC | PRN
  Administered 2018-06-05: 1 via TOPICAL
  Filled 2018-06-04: qty 56

## 2018-06-04 MED ORDER — LIDOCAINE HCL (PF) 1 % IJ SOLN
INTRAMUSCULAR | Status: AC
  Filled 2018-06-04: qty 30

## 2018-06-04 MED ORDER — ONDANSETRON HCL 4 MG/2ML IJ SOLN: 4.0000 mg | Freq: Four times a day (QID) | INTRAMUSCULAR | Status: DC | PRN

## 2018-06-04 MED ORDER — SENNOSIDES-DOCUSATE SODIUM 8.6-50 MG PO TABS
2.0000 | ORAL_TABLET | ORAL | Status: DC
  Administered 2018-06-04: 2 via ORAL
  Filled 2018-06-04 (×2): qty 2

## 2018-06-04 MED ORDER — LIDOCAINE-EPINEPHRINE (PF) 1.5 %-1:200000 IJ SOLN
INTRAMUSCULAR | Status: DC | PRN
  Administered 2018-06-04: 3 mL via EPIDURAL

## 2018-06-04 NOTE — Lactation Note (Signed)
This note was copied from a baby's chart. Lactation Consultation Note  Patient Name: Crystal Randall Today's Date: 06/04/2018 Reason for consult: Initial assessment;Primapara Sleepy baby but once placed skin to skin on mom's chest began to root Maternal Data Formula Feeding for Exclusion: No Has patient been taught Hand Expression?: Yes Does the patient have breastfeeding experience prior to this delivery?: No  Feeding Feeding Type: Breast Fed Latched easily to both breasts LATCH Score Latch: Grasps breast easily, tongue down, lips flanged, rhythmical sucking.  Audible Swallowing: A few with stimulation  Type of Nipple: Everted at rest and after stimulation  Comfort (Breast/Nipple): Soft / non-tender  Hold (Positioning): Assistance needed to correctly position infant at breast and maintain latch.  LATCH Score: 8  Interventions Interventions: Breast feeding basics reviewed;Assisted with latch;Skin to skin;Hand express;Adjust position;Support pillows;Position options  Lactation Tools Discussed/Used WIC Program: Yes Mom has a pump at home, wants to pump to save milk for going back to work, reviewed breastmilk storage guidelines in breastfeeding handout booklet and when to begin pumping  Consult Status Consult Status: Follow-up Date: 06/05/18 Follow-up type: In-patient    Ferol Luz 06/04/2018, 4:00 PM

## 2018-06-04 NOTE — OB Triage Note (Signed)
Pt is a 27y/o G1P0 at [redacted]w[redacted]d with c/o ctx since 2100. Pt states positive fetal movement. Pt denies LOF. Pt states some vaginal bleeding when wiping after the bathroom. Pt showed picture of scant amount of pink discharge. Pt was checked in the office on Friday (4cm, 80%,-3). Monitors applied and assessing. V/s WNL. Pain 10/10 in her lower abdomin. Initial FHT 150.

## 2018-06-04 NOTE — Anesthesia Preprocedure Evaluation (Signed)
Anesthesia Evaluation  Patient identified by MRN, date of birth, ID band Patient awake    Reviewed: Allergy & Precautions, H&P , NPO status , Patient's Chart, lab work & pertinent test results  Airway Mallampati: III  TM Distance: >3 FB Neck ROM: full    Dental  (+) Chipped   Pulmonary neg shortness of breath, asthma , Current Smoker,           Cardiovascular Exercise Tolerance: Good (-) hypertensionnegative cardio ROS       Neuro/Psych    GI/Hepatic negative GI ROS,   Endo/Other    Renal/GU   negative genitourinary   Musculoskeletal   Abdominal   Peds  Hematology negative hematology ROS (+)   Anesthesia Other Findings Past Medical History: No date: Asthma  Past Surgical History: No date: NO PAST SURGERIES  BMI    Body Mass Index:  27.28 kg/m      Reproductive/Obstetrics (+) Pregnancy                             Anesthesia Physical Anesthesia Plan  ASA: III  Anesthesia Plan: Epidural   Post-op Pain Management:    Induction:   PONV Risk Score and Plan:   Airway Management Planned:   Additional Equipment:   Intra-op Plan:   Post-operative Plan:   Informed Consent: I have reviewed the patients History and Physical, chart, labs and discussed the procedure including the risks, benefits and alternatives for the proposed anesthesia with the patient or authorized representative who has indicated his/her understanding and acceptance.       Plan Discussed with: Anesthesiologist  Anesthesia Plan Comments:         Anesthesia Quick Evaluation

## 2018-06-04 NOTE — Progress Notes (Signed)
  Labor Progress Note   28 y.o. G1P0 @ [redacted]w[redacted]d , admitted for Pregnancy, Labor Management.   Subjective:  Comfortable with epidural, feeling more pressure, desires SVE.  Objective:  BP (!) 96/48 (BP Location: Right Arm)   Pulse 95   Temp 98.4 F (36.9 C) (Oral)   Resp 16   Ht 5\' 3"  (1.6 m)   Wt 69.9 kg   LMP 08/31/2017 (Exact Date)   SpO2 97%   BMI 27.28 kg/m  Abd: gravid, ND, FHT present, no tenderness on exam Extr: no edema SVE: CERVIX: 9 cm dilated, 100% effaced, 0 station, BBOW.  AROM to clear/bloody fluid, now anterior lip.  EFM: FHR: 155 bpm, variability: moderate,  accelerations:  Present,  decelerations:  Rare variables. Toco: Frequency: Every 2-3 minutes Labs: I have reviewed the patient's lab results.   Assessment & Plan:  G1P0 @ [redacted]w[redacted]d, admitted for  Pregnancy and Labor/Delivery Management  1. Pain management: epidural. 2. FWB: FHT category 1 3. ID: GBS negative 4. Labor management: labor down, anticipate vaginal birth  All discussed with patient, see orders   Pt seen and evaluated by Mayford Knife RN SNM under direct supervision of Rod Can CNM   Rod Can, [facility], [facility] 06/04/2018  8:10 AM

## 2018-06-04 NOTE — Progress Notes (Signed)
Ch visited pt in regards to an OR for AD education. Ch met pt and family as they were enjoying family time w/ the newborn baby girl. Ch had a conversation w/ pt and family and addressed the order for an AD. Pt shared that she did not understand what it was at the time and was interested in moving forward with creating an AD. Ch assessed that the pt was healthy and recovered well enough as pt shared that she would be d/c hopefully tomorrow. No further needs at this time.    06/04/18 1000  Clinical Encounter Type  Visited With Patient and family together  Visit Type Psychological support;Spiritual support;Social support;Other (Comment) (AD edu)  Referral From Nurse  Consult/Referral To Chaplain  Spiritual Encounters  Spiritual Needs Emotional  Stress Factors  Patient Stress Factors Major life changes  Advance Directives (For Healthcare)  Does Patient Have a Medical Advance Directive? No  Would patient like information on creating a medical advance directive? No - Patient declined

## 2018-06-04 NOTE — Anesthesia Procedure Notes (Signed)
Epidural Patient location during procedure: OB Start time: 06/04/2018 4:50 AM End time: 06/04/2018 4:56 AM  Staffing Anesthesiologist: Piscitello, Precious Haws, MD Performed: anesthesiologist   Preanesthetic Checklist Completed: patient identified, site marked, surgical consent, pre-op evaluation, timeout performed, IV checked, risks and benefits discussed and monitors and equipment checked  Epidural Patient position: sitting Prep: ChloraPrep Patient monitoring: heart rate, continuous pulse ox and blood pressure Approach: midline Location: L3-L4 Injection technique: LOR saline  Needle:  Needle type: Tuohy  Needle gauge: 17 G Needle length: 9 cm and 9 Needle insertion depth: 5.5 cm Catheter type: closed end flexible Catheter size: 19 Gauge Catheter at skin depth: 10.5 cm Test dose: negative and 1.5% lidocaine with Epi 1:200 K  Assessment Sensory level: T10 Events: blood not aspirated, injection not painful, no injection resistance, negative IV test and no paresthesia  Additional Notes 1 attempt Pt. Evaluated and documentation done after procedure finished. Patient identified. Risks/Benefits/Options discussed with patient including but not limited to bleeding, infection, nerve damage, paralysis, failed block, incomplete pain control, headache, blood pressure changes, nausea, vomiting, reactions to medication both or allergic, itching and postpartum back pain. Confirmed with bedside nurse the patient's most recent platelet count. Confirmed with patient that they are not currently taking any anticoagulation, have any bleeding history or any family history of bleeding disorders. Patient expressed understanding and wished to proceed. All questions were answered. Sterile technique was used throughout the entire procedure. Please see nursing notes for vital signs. Test dose was given through epidural catheter and negative prior to continuing to dose epidural or start infusion. Warning signs of  high block given to the patient including shortness of breath, tingling/numbness in hands, complete motor block, or any concerning symptoms with instructions to call for help. Patient was given instructions on fall risk and not to get out of bed. All questions and concerns addressed with instructions to call with any issues or inadequate analgesia.   Patient tolerated the insertion well without immediate complications.Reason for block:procedure for pain

## 2018-06-04 NOTE — Discharge Summary (Signed)
OB Discharge Summary     Patient Name: Crystal Randall DOB: 26-Jun-1990 MRN: [id number]  Date of admission: 06/04/2018 Delivering Provider: Mayford Knife SNM and Rod Can, CNM  Date of Delivery: 06/04/2018  Date of discharge: 06/05/2018  Admitting diagnosis: Pregnancy - contractions Intrauterine pregnancy: [redacted]w[redacted]d     Secondary diagnosis: None     Discharge diagnosis: Term Pregnancy Delivered                                                                                                Post partum procedures:None  Augmentation: AROM  Complications: None  Hospital course:  Onset of Labor With Vaginal Delivery      28 y.o. yo G1P1001 at [redacted]w[redacted]d was admitted in Active Labor on 06/04/2018.  Patient had an uncomplicated labor course as follows:  Membrane Rupture Time/Date: 6:43 AM ,06/04/2018   Patient had delivery of viable female at 8:47 AM, 06/04/2018 See delivery note for details.  Patient had an uncomplicated postpartum course.  She is ambulating, tolerating a regular diet, passing flatus, and urinating well. Patient is discharged home in stable condition on 06/05/2018.   Physical exam  Vitals:   06/04/18 1548 06/04/18 1955 06/04/18 2315 06/05/18 0810  BP: 101/64 121/75 111/75 107/74  Pulse: 90 (!) 101 95 93  Resp: 20 18 18 20   Temp: 98.7 F (37.1 C) 98.5 F (36.9 C) 98.7 F (37.1 C) 98.5 F (36.9 C)  TempSrc: Oral Oral Oral Oral  SpO2: 98% 96% 98% 97%  Weight:      Height:       General: alert, cooperative and no distress Lochia: appropriate Uterine Fundus: firm Incision: N/A DVT Evaluation: No evidence of DVT seen on physical exam.  Labs: Lab Results  Component Value Date   WBC 16.3 (H) 06/05/2018   HGB 8.7 (L) 06/05/2018   HCT 27.3 (L) 06/05/2018   MCV 70.9 (L) 06/05/2018   PLT 310 06/05/2018    Discharge instruction: per After Visit Summary.  Medications:  Allergies as of 06/05/2018   No Known Allergies     Medication List    TAKE these  medications   albuterol 108 (90 Base) MCG/ACT inhaler Commonly known as:  PROVENTIL HFA;VENTOLIN HFA Inhale 2 puffs into the lungs every 6 (six) hours as needed for wheezing or shortness of breath.   norethindrone 0.35 MG tablet Commonly known as:  MICRONOR,CAMILA,ERRIN Take 1 tablet (0.35 mg total) by mouth daily. Start taking on:  June 23, 2018   PRENATAL PO Take by mouth.       Diet: routine diet  Activity: Advance as tolerated. Pelvic rest for 6 weeks.   Outpatient follow up: Follow-up Information    Rod Can, CNM. Schedule an appointment as soon as possible for a visit in 6 week(s).   Specialty:  Obstetrics Why:  postpartum follow-up Contact information: [address] [place] [address] [phone]             Postpartum contraception: Progesterone only pills Rhogam Given postpartum: N/A Rubella vaccine given postpartum: Declined Varicella vaccine given postpartum: Varicella immune TDaP given antepartum or  postpartum: Declined  Newborn Data: Live born female "[name]" Birth Weight:  2750 g (6 lb 1 oz) APGAR: 32, 9  Newborn Delivery   Birth date/time:  06/04/2018 08:47:00 Delivery type:  Vaginal, Spontaneous      Baby Feeding: Breast  Disposition:home with mother  SIGNED:  Mayford Knife, RN, SNM Rod Can, CNM 06/05/2018 2:17 PM

## 2018-06-04 NOTE — H&P (Signed)
Obstetrics Admission History & Physical   Contractions   HPI:  28 y.o. G1P0 @ [redacted]w[redacted]d (06/07/2018, by Last Menstrual Period). Admitted on 06/04/2018:   Patient Active Problem List   Diagnosis Date Noted  . Normal labor 06/04/2018  . False labor after 37 completed weeks of gestation 05/29/2018  . Pregnancy 05/26/2018  . Vaginal bleeding in pregnancy, second trimester 01/20/2018  . Vulvovaginal candidiasis 11/28/2017  . Supervision of normal first pregnancy, antepartum 10/26/2017     Presents for painful contractions that began around 2100 on 06/03/2018. She has not had any loss of fluid. She has had some pink-tinged discharge when wiping. Baby is moving well. Has made cervical change since presenting to triage.  [facility] at: at [facility]. Pregnancy complicated by mild asthma.  ROS: A review of systems was performed and negative, except as stated in the above HPI. PMHx:  Past Medical History:  Diagnosis Date  . Asthma    PSHx:  Past Surgical History:  Procedure Laterality Date  . NO PAST SURGERIES     Medications:  Medications Prior to Admission  Medication Sig Dispense Refill Last Dose  . albuterol (PROVENTIL HFA;VENTOLIN HFA) 108 (90 Base) MCG/ACT inhaler Inhale 2 puffs into the lungs every 6 (six) hours as needed for wheezing or shortness of breath. 1 Inhaler 1 Past Week at Unknown time  . Prenatal Vit-Fe Fumarate-FA (PRENATAL PO) Take by mouth.   06/03/2018 at Unknown time   Allergies: has No Known Allergies. OBHx:  OB History  Gravida Para Term Preterm AB Living  1            SAB TAB Ectopic Multiple Live Births               # Outcome Date GA Lbr Len/2nd Weight Sex Delivery Anes PTL Lv  1 Current            FHx: Negative/unremarkable except as detailed in HPI.Marland Kitchen  No family history of birth defects. Soc Hx: Current smoker, Alcohol: none and Recreational drug use: none  Objective:   Vitals:   06/04/18 0055  BP: 118/77  Pulse: 95  Resp: 16  Temp: 98.1 F (36.7  C)   Constitutional: Well nourished, well developed female in no acute distress.  HEENT: normal Skin: Warm and dry.  Cardiovascular: Regular rate and rhythm.   Extremity: trace to 1+ bilateral pedal edema Respiratory: Clear to auscultation bilaterally. Normal respiratory effort Abdomen: gravid, non-tender, painful contractions Neuro: Cranial nerves grossly intact Psych: Alert and Oriented x3. No memory deficits. Normal mood and affect.  MS: normal gait, normal bilateral lower extremity ROM/strength/stability.  Cervix: 7/90/-2 (RN exam) Uterus: Spontaneous uterine activity   EFM:FHR: 150 bpm, variability: moderate,  accelerations:  Present,  decelerations:  Present Early Toco: Frequency: Every 2-4 minutes, Duration: 80-110 seconds and Intensity: moderate   Perinatal info:  Blood type: B positive Rubella - Not immune Varicella - Immune TDaP declined during this pregnancy RPR NR / HIV Neg/ HBsAg Neg   Assessment & Plan:   28 y.o. G1P0 @ [redacted]w[redacted]d, Admitted on 06/04/2018 for labor.    Admit for labor, Observe for cervical change, Fetal Wellbeing Reassuring, Epidural when ready, AROM when Appropriate and GBS status negative, treat as needed  Avel Sensor, [facility], [facility] 06/04/2018

## 2018-06-04 NOTE — Progress Notes (Signed)
  Labor Progress Note   28 y.o. G1P0 @ [redacted]w[redacted]d , admitted for  Pregnancy, Labor Management.  Subjective:  Comfortable with epidural. Has been able to rest.  Objective:  BP 104/60 (BP Location: Right Arm)   Pulse 100   Temp 98.1 F (36.7 C) (Oral)   Resp 16   Ht 5\' 3"  (1.6 m)   Wt 69.9 kg   LMP 08/31/2017 (Exact Date)   SpO2 97%   BMI 27.28 kg/m  Abd: non-tender Extr: trace to 1+ bilateral pedal edema SVE: 8.5/90/-2, AROM with small amount of clear fluid, bloody show  EFM: FHR: 155 bpm, variability: moderate,  accelerations:  Present,  decelerations:  Present variable Toco: Frequency: Every 2-5 minutes, Duration: 80-120 seconds and Intensity: moderate Labs: I have reviewed the patient's lab results.   Assessment & Plan:  G1P0 @ [redacted]w[redacted]d, admitted for  Pregnancy and Labor/Delivery Management  1. Pain management: epidural. 2. FWB: FHT category II.  3. ID: GBS negative 4. Labor management: continue to assess for change, anticipate SVD.  Avel Sensor, CNM 06/04/2018  6:53 AM

## 2018-06-05 ENCOUNTER — Encounter: Payer: Medicaid Other | Admitting: Obstetrics & Gynecology

## 2018-06-05 LAB — CBC
HCT: 27.3 % — ABNORMAL LOW (ref 36.0–46.0)
Hemoglobin: 8.7 g/dL — ABNORMAL LOW (ref 12.0–15.0)
MCH: 22.6 pg — ABNORMAL LOW (ref 26.0–34.0)
MCHC: 31.9 g/dL (ref 30.0–36.0)
MCV: 70.9 fL — ABNORMAL LOW (ref 80.0–100.0)
Platelets: 310 10*3/uL (ref 150–400)
RBC: 3.85 MIL/uL — ABNORMAL LOW (ref 3.87–5.11)
RDW: 14.6 % (ref 11.5–15.5)
WBC: 16.3 10*3/uL — AB (ref 4.0–10.5)
nRBC: 0 % (ref 0.0–0.2)

## 2018-06-05 LAB — RPR: RPR Ser Ql: NONREACTIVE

## 2018-06-05 MED ORDER — NORETHINDRONE 0.35 MG PO TABS: 1.0000 | ORAL_TABLET | Freq: Every day | ORAL | 4 refills | Status: DC

## 2018-06-05 NOTE — Progress Notes (Signed)
Discharge order received from doctor. MMR and Tdap vaccines offered prior to discharge. Patient declined both MMR and Tdap. Reviewed discharge instructions and prescriptions with patient and answered all questions. Follow up appointment instructions given. Patient verbalized understanding. ID bands checked. Patient discharged home with infant via wheelchair by nursing/auxillary.    Hilbert Bible, RN

## 2018-06-05 NOTE — Progress Notes (Signed)
Patient informed that her rubella status is non immune. CNM explained at a MMR vaccine is recommended postpartum. However, patient refused MMR vaccine at this time.   Hilbert Bible, RN

## 2018-06-05 NOTE — Anesthesia Postprocedure Evaluation (Signed)
Anesthesia Post Note  Patient: Crystal Randall  Procedure(s) Performed: AN AD HOC LABOR EPIDURAL  Patient location during evaluation: Mother Baby Anesthesia Type: Epidural Level of consciousness: awake and alert Pain management: pain level controlled Vital Signs Assessment: post-procedure vital signs reviewed and stable Respiratory status: spontaneous breathing, nonlabored ventilation and respiratory function stable Cardiovascular status: stable Postop Assessment: no headache, no backache and epidural receding Anesthetic complications: no     Last Vitals:  Vitals:   06/04/18 2315 06/05/18 0810  BP: 111/75 107/74  Pulse: 95 93  Resp: 18 20  Temp: 37.1 C 36.9 C  SpO2: 98% 97%    Last Pain:  Vitals:   06/05/18 0810  TempSrc: Oral  PainSc:                  [name]

## 2018-06-05 NOTE — Progress Notes (Signed)
Post Partum Day 1  Subjective: Doing well, c/o perineal soreness but adding DermaPlast has helped.  Tolerating regular diet, pain with PO meds, voiding and ambulating without difficulty.  No CP SOB F/C N/V or leg pain no HA change of vision, RUQ/epigastric pain  Objective: BP 107/74 (BP Location: Right Arm)   Pulse 93   Temp 98.5 F (36.9 C) (Oral)   Resp 20   Ht 5' 3" (1.6 m)   Wt 69.9 kg   LMP 08/31/2017 (Exact Date)   SpO2 97%   Breastfeeding  BMI 27.28 kg/m    Physical Exam:  General: NAD CV: RRR Pulm: nl effort, CTABL Lochia: light Uterine Fundus: fundus firm at U-2 DVT Evaluation: no swelling, soreness.    Recent Labs    06/04/18 0353 06/05/18 0343  HGB 9.0* 8.7*  HCT 29.1* 27.3*  WBC 15.1* 16.3*  PLT 288 310    Assessment/Plan: 27 y.o. G1P1001 postpartum day # 1  1. Anemia: continue PNV with iron, iron rich foods 2. Breastfeeding: lactation consult prn 3. Declines Tdap and MMR 4. Desires POPs for birth control 5. Anticipate d/c today or tomorrow pending newborn screening 6. Continue routine PP care   Pt seen by Tracy M Cipperly Beasley RN SNM with direct supervision of Jane Gledhill CNM  Jane Gledhill, CNM     

## 2018-06-05 NOTE — Lactation Note (Signed)
This note was copied from a baby's chart. Lactation Consultation Note  Patient Name: Crystal Randall Today's Date: 06/05/2018     Maternal Data    Feeding Feeding Type: Breast Fed  LATCH Score                   Interventions    Lactation Tools Discussed/Used     Consult Status  Mother states that infant clusterfed last night and her nipples are sore. Mother states that she realized that infant was only latched to the nipple on the left breast and has a scab on the area. LC discussed with mother how to achieve a deep latch, frequency of wet and dirty diapers, how her milk will change from colostrum to mature milk and gave her information on the lactation outpatient clinic and moms express breastfeeding group at [facility]. Mother states that she will call lactation if she is having trouble latching at the next feeding.    Elvera Lennox 5/45/6256, 10:31 AM

## 2018-06-05 NOTE — Discharge Instructions (Signed)
Please call your doctor or return to the ER if you experience any chest pains, shortness of breath, dizziness, visual changes, fever greater than 101, any heavy bleeding (saturating more than 1 pad per hour), large clots, or foul smelling discharge, any worsening abdominal pain and cramping that is not controlled by pain medication, or any signs of postpartum depression. No tampons, enemas, douches, or sexual intercourse for 6 weeks. Also avoid tub baths, hot tubs, or swimming for 6 weeks.  °

## 2018-08-16 ENCOUNTER — Telehealth: Payer: Self-pay

## 2018-08-16 ENCOUNTER — Encounter: Payer: Self-pay | Admitting: Obstetrics & Gynecology

## 2018-08-16 NOTE — Telephone Encounter (Signed)
Letter in Epic to print Needs in office post partum check w Opal Sidles (needs PAP)

## 2018-08-16 NOTE — Telephone Encounter (Signed)
Pt aware.

## 2018-08-16 NOTE — Telephone Encounter (Signed)
Pt states she needs a not to return to work from having her baby. Also, she needs to schedule a postpartum visit.  CB# [phone]

## 2018-08-16 NOTE — Telephone Encounter (Signed)
Does she need her 6 week check up before returning to work and getting the letter? Can this be a telephone visit?

## 2018-08-26 NOTE — Telephone Encounter (Signed)
Pt calling; needs release to return to work faxed to job; needs before 6/11.  [phone]  Pt states fax # is [phone]

## 2018-08-26 NOTE — Telephone Encounter (Signed)
Done

## 2018-09-04 ENCOUNTER — Ambulatory Visit (INDEPENDENT_AMBULATORY_CARE_PROVIDER_SITE_OTHER): Payer: Medicaid Other | Admitting: Advanced Practice Midwife

## 2018-09-04 ENCOUNTER — Other Ambulatory Visit: Payer: Self-pay

## 2018-09-04 ENCOUNTER — Other Ambulatory Visit (HOSPITAL_COMMUNITY)
Admission: RE | Admit: 2018-09-04 | Discharge: 2018-09-04 | Disposition: A | Discharge status: Home / Self Care | Payer: Medicaid Other | Source: Ambulatory Visit | Attending: Advanced Practice Midwife | Admitting: Advanced Practice Midwife

## 2018-09-04 ENCOUNTER — Encounter: Payer: Self-pay | Admitting: Advanced Practice Midwife

## 2018-09-04 DIAGNOSIS — Z124 Encounter for screening for malignant neoplasm of cervix: Secondary | ICD-10-CM

## 2018-09-04 DIAGNOSIS — N898 Other specified noninflammatory disorders of vagina: Secondary | ICD-10-CM

## 2018-09-04 NOTE — Progress Notes (Signed)
Postpartum Visit  Chief Complaint:  Chief Complaint  Patient presents with  . Postpartum Care  . Breast Exam    before having baby, noticed lump on right breast (small), now is bigger and is painful    History of Present Illness: Patient is a 28 y.o. G1P1001 presents for postpartum visit.  See note from breast ultrasound on 05/15/18 and surgical pathology report copied below regarding breast mass that patient first noticed during her pregnancy. The lump was noted to be 2.2 x 1.5 x 2.2 cm on 2/26. She has noticed the lump in her right breast getting larger since delivery. It is occasionally painful. She is breastfeeding without difficulty. She denies any problems with mastitis.   She has also noticed some yellow vaginal discharge with a fishy odor for the past 6 weeks. She denies any itching or irritation.   SURGICAL PATHOLOGY Surgical Pathology  CASE: [phone]  PATIENT: Crystal Randall  Surgical Pathology Report      SPECIMEN SUBMITTED:  A. Breast, right, retroareolar 10:30 axis   CLINICAL HISTORY:  28 year old pregnant patient with a well-circumscribed hypoechoic mass  with cystic spaces in the right breast at 10:30 in the retroareolar  region measuring 2.2 cm   PRE-OPERATIVE DIAGNOSIS:  Lactating adenoma vs fibroadenoma vs CA (less likely)   POST-OPERATIVE DIAGNOSIS:  Same as above      DIAGNOSIS:  A. 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   Comment:  This may represent sampling of a fibroadenoma with superimposed  lactational and possible ischemic change. Correlation with radiographic  findings and clinical impression is required.     GROSS DESCRIPTION:  A. Labeled: Right breast RA 10:30  Received: Formalin  Time/date in fixative: Collected and placed into formalin at 8:04 AM on  05/21/2018  Cold ischemic time: Less than 1 hour  Total fixation  time: 10.5 hours  Core pieces: 6  Size: Ranging from 0.4-0.8 cm in length and 0.1 cm in diameter  Description: Disrupted, needle core biopsy fragments of tan-yellow  fibrofatty tissue  Ink color: Blue  Entirely submitted in cassettes 1-2.     Final Diagnosis performed by Quay Burow, MD.  Electronically signed  05/22/2018 4:55:01PM  The electronic signature indicates that the named Attending Pathologist  has evaluated the specimen  Technical component performed at [facility], [address] [place],  [place] [address] Lab: [phone] Dir: Rush Farmer, MD, MMM Professional  component performed at [facility], [facility], [place], [place], [place] [address] Lab: [phone] Dir: Dellia Nims.  Reuel Derby, MD   Resulting Agency [facility]      Specimen Collected: 05/21/18 09:13  Last Resulted: 05/22/18 17:10       Review the Delivery Report for details.   Date of delivery: 06/04/2018 by Student Nurse Midwife Olivia Mackie Cipperly-Beasley under the supervision of Rod Can, CNM Type of delivery: Vaginal delivery - Vacuum or forceps assisted  no Episiotomy No.  Laceration: 2nd degree repaired with 3.0 vicryl  Pregnancy or labor problems:  Breast lump- see note above Any problems since the delivery:  No other concerns  Newborn Details:  SINGLETON :  1. BabyGender female Crystal Randall. Birth weight: 2750 g Maternal Details:  Breast or formula feeding: breastfeeding Intercourse: Yes  Contraception after delivery: Yes  Any bowel or bladder issues: No  Post partum depression/anxiety noted:  no Edinburgh Post-Partum Depression Score: 1 Date of last PAP: PAP smear  today  Review of Systems: Review of Systems  Constitutional: Negative.   HENT: Negative.   Eyes: Negative.   Respiratory: Negative.   Cardiovascular: Negative.   Gastrointestinal: Negative.   Genitourinary:       Vaginal discharge, vaginal odor  Skin: Negative.   Breast: lump in right breast  Past  Medical History:  Past Medical History:  Diagnosis Date  . Asthma   . Supervision of normal first pregnancy, antepartum 10/26/2017   Clinic [facility] Prenatal Labs Dating LMP =9wk [place] Blood type: B/Positive/-- (08/09 1604)  Genetic Screen   NIPS: normal XX Antibody:Negative (08/09 1604) Anatomic [place]  complete Rubella: <0.90 (08/09 1604) Varicella: Immune GTT  Third trimester: 84 RPR: Non Reactive (08/09 1604)  Rhogam  not needed HBsAg: Negative (08/09 1604)  TDaP vaccine    Declines                    Flu Shot:12/28/17 HIV: Non Re    Past Surgical History:  Past Surgical History:  Procedure Laterality Date  . NO PAST SURGERIES      Family History:  History reviewed. No pertinent family history.  Social History:  Social History   Socioeconomic History  . Marital status: Single    Spouse name: Not on file  . Number of children: Not on file  . Years of education: Not on file  . Highest education level: Not on file  Occupational History  . Not on file  Social Needs  . Financial resource strain: Not hard at all  . Food insecurity    Worry: Patient refused    Inability: Patient refused  . Transportation needs    Medical: No    Non-medical: No  Tobacco Use  . Smoking status: Current Every Day Smoker    Packs/day: 0.25    Years: 2.00    Pack years: 0.50    Types: Cigarettes  . Smokeless tobacco: Never Used  Substance and Sexual Activity  . Alcohol use: No  . Drug use: Not Currently    Types: Barbituates  . Sexual activity: Yes    Birth control/protection: None  Lifestyle  . Physical activity    Days per week: Patient refused    Minutes per session: Patient refused  . Stress: Not at all  Relationships  . Social Herbalist on phone: Patient refused    Gets together: Patient refused    Attends religious service: Patient refused    Active member of club or organization: Patient refused    Attends meetings of clubs or organizations: Patient refused    Relationship  status: Patient refused  . Intimate partner violence    Fear of current or ex partner: No    Emotionally abused: No    Physically abused: No    Forced sexual activity: No  Other Topics Concern  . Not on file  Social History Narrative  . Not on file    Allergies:  No Known Allergies  Medications: Prior to Admission medications   Medication Sig Start Date End Date Taking? Authorizing Provider  albuterol (PROVENTIL HFA;VENTOLIN HFA) 108 (90 Base) MCG/ACT inhaler Inhale 2 puffs into the lungs every 6 (six) hours as needed for wheezing or shortness of breath. 01/30/18  Yes Gregor Hams, MD  norethindrone (MICRONOR,CAMILA,ERRIN) 0.35 MG tablet Take 1 tablet (0.35 mg total) by mouth daily. Patient not taking: Reported on 09/04/2018 06/23/18   Rod Can, CNM  Prenatal Vit-Fe Fumarate-FA (PRENATAL PO) Take by mouth.  [provider]    Physical Exam Blood pressure 120/78, height 5\' 3"  (1.6 m), weight 142 lb 12.8 oz (64.8 kg), last menstrual period 08/25/2018, currently breastfeeding.  General: NAD HEENT: normocephalic, anicteric Breast: Lactating, lump in right breast at 10:30, 2.5 cm x 2.3 cm, mobile, well circumscribed, non-tender to palpation Pulmonary: No increased work of breathing Abdomen: NABS, soft, non-tender, non-distended.  Umbilicus without lesions.  No hepatomegaly, splenomegaly or masses palpable. No evidence of hernia. Genitourinary:  External: Normal external female genitalia.  Normal urethral meatus, normal Bartholin's and Skene's glands.    Vagina: Normal vaginal mucosa, no evidence of prolapse. Small amount of white discharge    Cervix: Grossly normal in appearance, friable with PAP, no CMT  Uterus: Non-enlarged, mobile, normal contour.    Adnexa: ovaries non-enlarged, no adnexal masses  Rectal: deferred Extremities: no edema, erythema, or tenderness Neurologic: Grossly intact Psychiatric: mood appropriate, affect full    Assessment: 28 y.o.  G1P1001 presenting for 6 week postpartum visit  Plan: Problem List Items Addressed This Visit    None    Visit Diagnoses    6 weeks postpartum follow-up    -  Primary   Relevant Orders   Cytology - PAP   Cervical cancer screening       Relevant Orders   Cytology - PAP   Vaginal discharge       Relevant Orders   Cervicovaginal ancillary only   Vaginal odor       Relevant Orders   Cervicovaginal ancillary only       1) Contraception - Education given regarding options for contraception, as well as compatibility with breast feeding if applicable.  Patient plans on oral progesterone-only contraceptive for contraception.  2)  Pap - ASCCP guidelines and rationale discussed.  ASCCP guidelines and rational discussed.  Patient opts for every 3 years screening interval  3) Patient underwent screening for postpartum depression with no signs of depression  4) Return in about 1 year (around 09/04/2019) for annual established gyn and PRN for worsening of symptoms   Rod Can, [facility] 09/04/2018, 3:38 PM

## 2018-09-06 ENCOUNTER — Other Ambulatory Visit: Payer: Self-pay | Admitting: Advanced Practice Midwife

## 2018-09-06 DIAGNOSIS — B9689 Other specified bacterial agents as the cause of diseases classified elsewhere: Secondary | ICD-10-CM

## 2018-09-06 LAB — CERVICOVAGINAL ANCILLARY ONLY
Bacterial vaginitis: POSITIVE — AB
Candida vaginitis: NEGATIVE

## 2018-09-06 MED ORDER — METRONIDAZOLE 0.75 % VA GEL: 1.0000 | Freq: Every day | VAGINAL | 0 refills | Status: AC

## 2018-09-06 NOTE — Progress Notes (Signed)
Rx metrogel sent to patient pharmacy for recent diagnosis BV.

## 2018-09-09 LAB — CYTOLOGY - PAP

## 2018-09-24 ENCOUNTER — Other Ambulatory Visit (HOSPITAL_COMMUNITY)
Admission: RE | Admit: 2018-09-24 | Discharge: 2018-09-24 | Disposition: A | Discharge status: Home / Self Care | Payer: Medicaid Other | Source: Ambulatory Visit | Attending: Obstetrics & Gynecology | Admitting: Obstetrics & Gynecology

## 2018-09-24 ENCOUNTER — Encounter: Payer: Self-pay | Admitting: Obstetrics & Gynecology

## 2018-09-24 ENCOUNTER — Ambulatory Visit (INDEPENDENT_AMBULATORY_CARE_PROVIDER_SITE_OTHER): Payer: Medicaid Other | Admitting: Obstetrics & Gynecology

## 2018-09-24 ENCOUNTER — Other Ambulatory Visit: Payer: Self-pay

## 2018-09-24 ENCOUNTER — Ambulatory Visit: Payer: Medicaid Other | Admitting: Obstetrics & Gynecology

## 2018-09-24 VITALS — BP 120/80 | Ht 63.0 in | Wt 142.0 lb

## 2018-09-24 DIAGNOSIS — D069 Carcinoma in situ of cervix, unspecified: Secondary | ICD-10-CM

## 2018-09-24 DIAGNOSIS — R87611 Atypical squamous cells cannot exclude high grade squamous intraepithelial lesion on cytologic smear of cervix (ASC-H): Secondary | ICD-10-CM | POA: Insufficient documentation

## 2018-09-24 DIAGNOSIS — N870 Mild cervical dysplasia: Secondary | ICD-10-CM | POA: Diagnosis not present

## 2018-09-24 DIAGNOSIS — N871 Moderate cervical dysplasia: Secondary | ICD-10-CM | POA: Diagnosis not present

## 2018-09-24 NOTE — Progress Notes (Signed)
Referring Provider:  none  HPI:  Crystal Randall is a 28 y.o.  G1P1001  who presents today for evaluation and management of abnormal cervical cytology.    Dysplasia History:  ASC-H Post partum PAP. No prior abn PAP  ROS:  Pertinent items are noted in HPI.  OB History  Gravida Para Term Preterm AB Living  1 1 1     1   SAB TAB Ectopic Multiple Live Births        0 1    # Outcome Date GA Lbr Len/2nd Weight Sex Delivery Anes PTL Lv  1 Term 06/04/18 [redacted]w[redacted]d 01:42 / 00:24 6 lb 1 oz (2.75 kg) F Vag-Spont EPI  LIV    Past Medical History:  Diagnosis Date   Asthma    Supervision of normal first pregnancy, antepartum 10/26/2017   Clinic [facility] Prenatal Labs Dating LMP =9wk [place] Blood type: B/Positive/-- (08/09 1604)  Genetic Screen   NIPS: normal XX Antibody:Negative (08/09 1604) Anatomic [place]  complete Rubella: <0.90 (08/09 1604) Varicella: Immune GTT  Third trimester: 84 RPR: Non Reactive (08/09 1604)  Rhogam  not needed HBsAg: Negative (08/09 1604)  TDaP vaccine    Declines                    Flu Shot:12/28/17 HIV: Non Re    Past Surgical History:  Procedure Laterality Date   NO PAST SURGERIES      SOCIAL HISTORY: Social History   Substance and Sexual Activity  Alcohol Use No   Social History   Substance and Sexual Activity  Drug Use Not Currently   Types: Barbituates     History reviewed. No pertinent family history.  ALLERGIES:  Patient has no known allergies.  Current Outpatient Medications on File Prior to Visit  Medication Sig Dispense Refill   albuterol (PROVENTIL HFA;VENTOLIN HFA) 108 (90 Base) MCG/ACT inhaler Inhale 2 puffs into the lungs every 6 (six) hours as needed for wheezing or shortness of breath. 1 Inhaler 1   norethindrone (MICRONOR,CAMILA,ERRIN) 0.35 MG tablet Take 1 tablet (0.35 mg total) by mouth daily. 3 Package 4   Prenatal Vit-Fe Fumarate-FA (PRENATAL PO) Take by mouth.     No current facility-administered medications on file prior to visit.      Physical Exam: -Vitals:  BP 120/80    Ht 5\' 3"  (1.6 m)    Wt 142 lb (64.4 kg)    LMP 08/25/2018 (Exact Date)    BMI 25.15 kg/m  GEN: WD, WN, NAD.  A+ O x 3, good mood and affect. ABD:  NT, ND.  Soft, no masses.  No hernias noted.   Pelvic:   Vulva: Normal appearance.  No lesions.  Vagina: No lesions or abnormalities noted.  Support: Normal pelvic support.  Urethra No masses tenderness or scarring.  Meatus Normal size without lesions or prolapse.  Cervix: See below.  Anus: Normal exam.  No lesions.  Perineum: Normal exam.  No lesions.        Bimanual   Uterus: Normal size.  Non-tender.  Mobile.  AV.  Adnexae: No masses.  Non-tender to palpation.  Cul-de-sac: Negative for abnormality.   PROCEDURE: 1.  Urine Pregnancy Test:  not done 2.  Colposcopy performed with 4% acetic acid after verbal consent obtained                                         -  Aceto-white Lesions Location(s): ectropion all around.              -Biopsy performed at 1, 6 o'clock               -ECC indicated and performed: Yes.       -Biopsy sites made hemostatic with pressure, AgNO3, and/or Monsel's solution   -Satisfactory colposcopy: Yes.      -Evidence of Invasive cervical CA :  NO  ASSESSMENT:  Crystal Randall is a 28 y.o. G1P1001 here for  1. Atypical squamous cells cannot exclude high grade squamous intraepithelial lesion on cytologic smear of cervix (ASC-H)   .  PLAN: 1.  I discussed the grading system of pap smears and HPV high risk viral types.  We will discuss and base management after colpo results return. 2. Follow up PAP 6 months, vs intervention if high grade dysplasia identified 3. Treatment of persistantly abnormal PAP smears and cervical dysplasia, even mild, is discussed w pt today in detail, as well as the pros and cons of Cryo and LEEP procedures. Will consider and discuss after results.      Barnett Applebaum, MD, [facility], [facility] 09/24/2018  2:52 PM

## 2018-09-24 NOTE — Patient Instructions (Signed)
Colposcopy, Care After This sheet gives you information about how to care for yourself after your procedure. Your health care provider may also give you more specific instructions. If you have problems or questions, contact your health care provider. What can I expect after the procedure? If you had a colposcopy without a biopsy, you can expect to feel fine right away, but you may have some spotting for a few days. You can go back to your normal activities. If you had a colposcopy with a biopsy, it is common to have:  Soreness and pain. This may last for a few days.  Light-headedness.  Mild vaginal bleeding or dark-colored, grainy discharge. This may last for a few days. The discharge may be due to a solution that was used during the procedure. You may need to wear a sanitary pad during this time.  Spotting for at least 48 hours after the procedure. Follow these instructions at home:   Take over-the-counter and prescription medicines only as told by your health care provider. Talk with your health care provider about what type of over-the-counter pain medicine and prescription medicine you can start taking again. It is especially important to talk with your health care provider if you take blood-thinning medicine.  Do not drive or use heavy machinery while taking prescription pain medicine.  For at least 3 days after your procedure, or as long as told by your health care provider, avoid: ? Douching. ? Using tampons. ? Having sexual intercourse.  Continue to use birth control (contraception).  Limit your physical activity for the first day after the procedure as told by your health care provider. Ask your health care provider what activities are safe for you.  It is up to you to get the results of your procedure. Ask your health care provider, or the department performing the procedure, when your results will be ready.  Keep all follow-up visits as told by your health care provider.  This is important. Contact a health care provider if:  You develop a skin rash. Get help right away if:  You are bleeding heavily from your vagina or you are passing blood clots. This includes using more than one sanitary pad per hour for 2 hours in a row.  You have a fever or chills.  You have pelvic pain.  You have abnormal, yellow-colored, or bad-smelling vaginal discharge. This could be a sign of infection.  You have severe pain or cramps in your lower abdomen that do not get better with medicine.  You feel light-headed or dizzy, or you faint. Summary  If you had a colposcopy without a biopsy, you can expect to feel fine right away, but you may have some spotting for a few days. You can go back to your normal activities.  If you had a colposcopy with a biopsy, you may notice mild pain and spotting for 48 hours after the procedure.  Avoid douching, using tampons, and having sexual intercourse for 3 days after the procedure or as long as told by your health care provider.  Contact your health care provider if you have bleeding, severe pain, or signs of infection. This information is not intended to replace advice given to you by your health care provider. Make sure you discuss any questions you have with your health care provider. Document Released: 12/25/2012 Document Revised: 02/16/2017 Document Reviewed: 10/22/2015 Elsevier Patient Education  2020 [employer].

## 2018-09-26 ENCOUNTER — Telehealth: Payer: Self-pay | Admitting: Obstetrics & Gynecology

## 2018-09-26 NOTE — Telephone Encounter (Signed)
-----   Message from Gae Dry, MD sent at 09/26/2018  3:26 PM EDT ----- Surgery Booking Request Patient Full Name:  Crystal Randall  MRN: [id number]  DOB: 1991/02/25  Surgeon: Hoyt Koch, MD  Requested Surgery Date and Time: Any Primary Diagnosis AND Code: Severe Cervical Dysplasia Secondary Diagnosis and Code:  Surgical Procedure: IN OFFICE LEEP   (pt may change her mind to have done in hospital, thus change scheduling if this is the case) L&D Notification: No Admission Status: OFFICE Length of Surgery: 20 min Special Case Needs: LEEP H&P: yes (date) Phone Interview???: yes Interpreter: Language:  Medical Clearance: no Special Scheduling Instructions: no Acuity: P2 - Cancer

## 2018-09-26 NOTE — Progress Notes (Signed)
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

## 2018-09-26 NOTE — Telephone Encounter (Signed)
Lmtrc

## 2018-09-26 NOTE — Progress Notes (Signed)
Colpo/ Biopsy  Diagnosis 1. Endocervix, curettage - BENIGN CERVICAL GLANDULAR MUCOSA - NEGATIVE FOR DYSPLASIA OR MALIGNANCY 2. 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 Cervix, biopsy, 1 o'clock - LOW-GRADE SQUAMOUS INTRAEPITHELIAL LESION (CIN1, LOW GRADE DYSPLASIA)  Rec LEEP D/w pt To Schedule

## 2018-12-05 ENCOUNTER — Telehealth: Payer: Self-pay | Admitting: Obstetrics & Gynecology

## 2018-12-05 NOTE — Telephone Encounter (Signed)
Lmtrc

## 2019-03-26 ENCOUNTER — Other Ambulatory Visit: Payer: Self-pay

## 2019-03-26 ENCOUNTER — Ambulatory Visit (LOCAL_COMMUNITY_HEALTH_CENTER): Payer: Medicaid Other

## 2019-03-26 VITALS — BP 125/76 | Ht 63.0 in | Wt 160.5 lb

## 2019-03-26 DIAGNOSIS — Z3201 Encounter for pregnancy test, result positive: Secondary | ICD-10-CM

## 2019-03-26 LAB — PREGNANCY, URINE: Preg Test, Ur: POSITIVE — AB

## 2019-03-26 MED ORDER — PRENATAL VITAMIN 27-0.8 MG PO TABS: 1.0000 | ORAL_TABLET | Freq: Every day | ORAL | 0 refills | Status: AC

## 2019-03-26 NOTE — Progress Notes (Signed)
Pt desires care at [facility]; declines preadmit.

## 2019-04-04 ENCOUNTER — Encounter: Payer: Self-pay | Admitting: Obstetrics & Gynecology

## 2019-04-04 ENCOUNTER — Other Ambulatory Visit (HOSPITAL_COMMUNITY)
Admission: RE | Admit: 2019-04-04 | Discharge: 2019-04-04 | Disposition: A | Discharge status: Home / Self Care | Payer: Medicaid Other | Source: Ambulatory Visit | Attending: Obstetrics & Gynecology | Admitting: Obstetrics & Gynecology

## 2019-04-04 ENCOUNTER — Other Ambulatory Visit: Payer: Self-pay

## 2019-04-04 ENCOUNTER — Ambulatory Visit (INDEPENDENT_AMBULATORY_CARE_PROVIDER_SITE_OTHER): Payer: Medicaid Other | Admitting: Obstetrics & Gynecology

## 2019-04-04 VITALS — BP 110/60 | Wt 160.0 lb

## 2019-04-04 DIAGNOSIS — Z3A12 12 weeks gestation of pregnancy: Secondary | ICD-10-CM | POA: Diagnosis not present

## 2019-04-04 DIAGNOSIS — R87611 Atypical squamous cells cannot exclude high grade squamous intraepithelial lesion on cytologic smear of cervix (ASC-H): Secondary | ICD-10-CM

## 2019-04-04 DIAGNOSIS — Z3491 Encounter for supervision of normal pregnancy, unspecified, first trimester: Secondary | ICD-10-CM | POA: Diagnosis not present

## 2019-04-04 DIAGNOSIS — Z1379 Encounter for other screening for genetic and chromosomal anomalies: Secondary | ICD-10-CM | POA: Diagnosis not present

## 2019-04-04 DIAGNOSIS — N926 Irregular menstruation, unspecified: Secondary | ICD-10-CM

## 2019-04-04 DIAGNOSIS — Z3490 Encounter for supervision of normal pregnancy, unspecified, unspecified trimester: Secondary | ICD-10-CM

## 2019-04-04 LAB — POCT URINALYSIS DIPSTICK OB: Glucose, UA: NEGATIVE

## 2019-04-04 NOTE — Patient Instructions (Signed)
First Trimester of Pregnancy The first trimester of pregnancy is from week 1 until the end of week 13 (months 1 through 3). A week after a sperm fertilizes an egg, the egg will implant on the wall of the uterus. This embryo will begin to develop into a baby. Genes from you and your partner will form the baby. The female genes will determine whether the baby will be a boy or a girl. At 6-8 weeks, the eyes and face will be formed, and the heartbeat can be seen on ultrasound. At the end of 12 weeks, all the baby's organs will be formed. Now that you are pregnant, you will want to do everything you can to have a healthy baby. Two of the most important things are to get good prenatal care and to follow your health care provider's instructions. Prenatal care is all the medical care you receive before the baby's birth. This care will help prevent, find, and treat any problems during the pregnancy and childbirth. Body changes during your first trimester Your body goes through many changes during pregnancy. The changes vary from woman to woman.  You may gain or lose a couple of pounds at first.  You may feel sick to your stomach (nauseous) and you may throw up (vomit). If the vomiting is uncontrollable, call your health care provider.  You may tire easily.  You may develop headaches that can be relieved by medicines. All medicines should be approved by your health care provider.  You may urinate more often. Painful urination may mean you have a bladder infection.  You may develop heartburn as a result of your pregnancy.  You may develop constipation because certain hormones are causing the muscles that push stool through your intestines to slow down.  You may develop hemorrhoids or swollen veins (varicose veins).  Your breasts may begin to grow larger and become tender. Your nipples may stick out more, and the tissue that surrounds them (areola) may become darker.  Your gums may bleed and may be  sensitive to brushing and flossing.  Dark spots or blotches (chloasma, mask of pregnancy) may develop on your face. This will likely fade after the baby is born.  Your menstrual periods will stop.  You may have a loss of appetite.  You may develop cravings for certain kinds of food.  You may have changes in your emotions from day to day, such as being excited to be pregnant or being concerned that something may go wrong with the pregnancy and baby.  You may have more vivid and strange dreams.  You may have changes in your hair. These can include thickening of your hair, rapid growth, and changes in texture. Some women also have hair loss during or after pregnancy, or hair that feels dry or thin. Your hair will most likely return to normal after your baby is born. What to expect at prenatal visits During a routine prenatal visit:  You will be weighed to make sure you and the baby are growing normally.  Your blood pressure will be taken.  Your abdomen will be measured to track your baby's growth.  The fetal heartbeat will be listened to between weeks 10 and 14 of your pregnancy.  Test results from any previous visits will be discussed. Your health care provider may ask you:  How you are feeling.  If you are feeling the baby move.  If you have had any abnormal symptoms, such as leaking fluid, bleeding, severe headaches, or abdominal   cramping.  If you are using any tobacco products, including cigarettes, chewing tobacco, and electronic cigarettes.  If you have any questions. Other tests that may be performed during your first trimester include:  Blood tests to find your blood type and to check for the presence of any previous infections. The tests will also be used to check for low iron levels (anemia) and protein on red blood cells (Rh antibodies). Depending on your risk factors, or if you previously had diabetes during pregnancy, you may have tests to check for high blood sugar  that affects pregnant women (gestational diabetes).  Urine tests to check for infections, diabetes, or protein in the urine.  An ultrasound to confirm the proper growth and development of the baby.  Fetal screens for spinal cord problems (spina bifida) and Down syndrome.  HIV (human immunodeficiency virus) testing. Routine prenatal testing includes screening for HIV, unless you choose not to have this test.  You may need other tests to make sure you and the baby are doing well. Follow these instructions at home: Medicines  Follow your health care provider's instructions regarding medicine use. Specific medicines may be either safe or unsafe to take during pregnancy.  Take a prenatal vitamin that contains at least 600 micrograms (mcg) of folic acid.  If you develop constipation, try taking a stool softener if your health care provider approves. Eating and drinking   Eat a balanced diet that includes fresh fruits and vegetables, whole grains, good sources of protein such as meat, eggs, or tofu, and low-fat dairy. Your health care provider will help you determine the amount of weight gain that is right for you.  Avoid raw meat and uncooked cheese. These carry germs that can cause birth defects in the baby.  Eating four or five small meals rather than three large meals a day may help relieve nausea and vomiting. If you start to feel nauseous, eating a few soda crackers can be helpful. Drinking liquids between meals, instead of during meals, also seems to help ease nausea and vomiting.  Limit foods that are high in fat and processed sugars, such as fried and sweet foods.  To prevent constipation: ? Eat foods that are high in fiber, such as fresh fruits and vegetables, whole grains, and beans. ? Drink enough fluid to keep your urine clear or pale yellow. Activity  Exercise only as directed by your health care provider. Most women can continue their usual exercise routine during  pregnancy. Try to exercise for 30 minutes at least 5 days a week. Exercising will help you: ? Control your weight. ? Stay in shape. ? Be prepared for labor and delivery.  Experiencing pain or cramping in the lower abdomen or lower back is a good sign that you should stop exercising. Check with your health care provider before continuing with normal exercises.  Try to avoid standing for long periods of time. Move your legs often if you must stand in one place for a long time.  Avoid heavy lifting.  Wear low-heeled shoes and practice good posture.  You may continue to have sex unless your health care provider tells you not to. Relieving pain and discomfort  Wear a good support bra to relieve breast tenderness.  Take warm sitz baths to soothe any pain or discomfort caused by hemorrhoids. Use hemorrhoid cream if your health care provider approves.  Rest with your legs elevated if you have leg cramps or low back pain.  If you develop varicose veins in   your legs, wear support hose. Elevate your feet for 15 minutes, 3-4 times a day. Limit salt in your diet. Prenatal care  Schedule your prenatal visits by the twelfth week of pregnancy. They are usually scheduled monthly at first, then more often in the last 2 months before delivery.  Write down your questions. Take them to your prenatal visits.  Keep all your prenatal visits as told by your health care provider. This is important. Safety  Wear your seat belt at all times when driving.  Make a list of emergency phone numbers, including numbers for family, friends, the hospital, and police and fire departments. General instructions  Ask your health care provider for a referral to a local prenatal education class. Begin classes no later than the beginning of month 6 of your pregnancy.  Ask for help if you have counseling or nutritional needs during pregnancy. Your health care provider can offer advice or refer you to specialists for help  with various needs.  Do not use hot tubs, steam rooms, or saunas.  Do not douche or use tampons or scented sanitary pads.  Do not cross your legs for long periods of time.  Avoid cat litter boxes and soil used by cats. These carry germs that can cause birth defects in the baby and possibly loss of the fetus by miscarriage or stillbirth.  Avoid all smoking, herbs, alcohol, and medicines not prescribed by your health care provider. Chemicals in these products affect the formation and growth of the baby.  Do not use any products that contain nicotine or tobacco, such as cigarettes and e-cigarettes. If you need help quitting, ask your health care provider. You may receive counseling support and other resources to help you quit.  Schedule a dentist appointment. At home, brush your teeth with a soft toothbrush and be gentle when you floss. Contact a health care provider if:  You have dizziness.  You have mild pelvic cramps, pelvic pressure, or nagging pain in the abdominal area.  You have persistent nausea, vomiting, or diarrhea.  You have a bad smelling vaginal discharge.  You have pain when you urinate.  You notice increased swelling in your face, hands, legs, or ankles.  You are exposed to fifth disease or chickenpox.  You are exposed to German measles (rubella) and have never had it. Get help right away if:  You have a fever.  You are leaking fluid from your vagina.  You have spotting or bleeding from your vagina.  You have severe abdominal cramping or pain.  You have rapid weight gain or loss.  You vomit blood or material that looks like coffee grounds.  You develop a severe headache.  You have shortness of breath.  You have any kind of trauma, such as from a fall or a car accident. Summary  The first trimester of pregnancy is from week 1 until the end of week 13 (months 1 through 3).  Your body goes through many changes during pregnancy. The changes vary from  woman to woman.  You will have routine prenatal visits. During those visits, your health care provider will examine you, discuss any test results you may have, and talk with you about how you are feeling. This information is not intended to replace advice given to you by your health care provider. Make sure you discuss any questions you have with your health care provider. Document Revised: 02/16/2017 Document Reviewed: 02/16/2016 Elsevier Patient Education  2020 [employer]  

## 2019-04-04 NOTE — Addendum Note (Signed)
Addended by: Quintella Baton D on: 04/04/2019 02:06 PM   Modules accepted: Orders

## 2019-04-04 NOTE — Progress Notes (Signed)
04/04/2019  Chief Complaint: Missed period  History of Present Illness: Crystal Randall is a 29 y.o. G2P1001 [redacted]w[redacted]d based on Patient's last menstrual period was 01/07/2019 (approximate). with an Estimated Date of Delivery: 10/14/19, with the above CC.   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  ALSO, right breast lump still present, min T.  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  Not currently breast feeding (did for 3 mos).  Her periods were: regular periods every 28 days She was using no method, as she ran out of OCPs for one month when she conceived.  She has Negative signs or symptoms of nausea/vomiting of pregnancy. She has Negative signs or symptoms of miscarriage or preterm labor She identifies Negative Zika risk factors for her and her partner On any different medications around the time she conceived/early pregnancy: No  History of varicella: Yes   ROS: A 12-point review of systems was performed and negative, except as stated in the above HPI.  OBGYN History: As per HPI. OB History  Gravida Para Term Preterm AB Living  2 1 1  0 0 1  SAB TAB Ectopic Multiple Live Births  0 0 0 0 1    # Outcome Date GA Lbr Len/2nd Weight Sex Delivery Anes PTL Lv  2 Current           1 Term 06/04/18 [redacted]w[redacted]d 01:42 / 00:24 6 lb 1 oz (2.75 kg) F Vag-Spont EPI  LIV    Any issues with any prior pregnancies: no Any prior children are healthy, doing well, without any problems or issues: yes History of pap smears: Yes. Last pap smear 08/2018. Abnormal: yes ASC-H, also BIOPSIES were CIN 2-3 History of STIs: No   Past Medical History: Past Medical History:  Diagnosis Date  . Asthma   . Supervision of normal first pregnancy, antepartum 10/26/2017   [facility]inic [facility] Prenatal Labs Dating LMP =9wk [place] Blood type: B/Positive/-- (08/09 1604)  Genetic Screen   NIPS: normal XX Antibody:Negative (08/09 1604) Anatomic [place]   complete Rubella: <0.90 (08/09 1604) Varicella: Immune GTT  Third trimester: 84 RPR: Non Reactive (08/09 1604)  Rhogam  not needed HBsAg: Negative (08/09 1604)  TDaP vaccine    Declines                    Flu Shot:12/28/17 HIV: Non Re    Past Surgical History: Past Surgical History:  Procedure Laterality Date  . denies    . NO PAST SURGERIES      Family History:  History reviewed. No pertinent family history. She denies any female cancers, bleeding or blood clotting disorders.  She denies any history of mental retardation, birth defects or genetic disorders in her or the FOB's history  Social History:  Social History   Socioeconomic History  . Marital status: Single    Spouse name: Not on file  . Number of children: Not on file  . Years of education: Not on file  . Highest education level: Not on file  Occupational History  . Not on file  Tobacco Use  . Smoking status: Current Every Day Smoker    Packs/day: 0.25    Years: 2.00    Pack years: 0.50    Types: Cigarettes  . Smokeless tobacco: Never Used  Substance and Sexual Activity  . Alcohol use: Not Currently  . Drug use: Not Currently  Types: Barbituates  . Sexual activity: Yes    Birth control/protection: None  Other Topics Concern  . Not on file  Social History Narrative  . Not on file   Social Determinants of Health   Financial Resource Strain: Low Risk   . Difficulty of Paying Living Expenses: Not hard at all  Food Insecurity: Unknown  . Worried About Charity fundraiser in the Last Year: Patient refused  . Ran Out of Food in the Last Year: Patient refused  Transportation Needs: No Transportation Needs  . Lack of Transportation (Medical): No  . Lack of Transportation (Non-Medical): No  Physical Activity: Unknown  . Days of Exercise per Week: Patient refused  . Minutes of Exercise per Session: Patient refused  Stress: No Stress Concern Present  . Feeling of Stress : Not at all  Social Connections:  Unknown  . Frequency of Communication with Friends and Family: Patient refused  . Frequency of Social Gatherings with Friends and Family: Patient refused  . Attends Religious Services: Patient refused  . Active Member of Clubs or Organizations: Patient refused  . Attends Archivist Meetings: Patient refused  . Marital Status: Patient refused  Intimate Partner Violence: Not At Risk  . Fear of Current or Ex-Partner: No  . Emotionally Abused: No  . Physically Abused: No  . Sexually Abused: No   Any pets in the household: no  Allergy: No Known Allergies  Current Outpatient Medications:  Current Outpatient Medications:  .  albuterol (PROVENTIL HFA;VENTOLIN HFA) 108 (90 Base) MCG/ACT inhaler, Inhale 2 puffs into the lungs every 6 (six) hours as needed for wheezing or shortness of breath. (Patient not taking: Reported on 04/04/2019), Disp: 1 Inhaler, Rfl: 1 .  norethindrone (MICRONOR,CAMILA,ERRIN) 0.35 MG tablet, Take 1 tablet (0.35 mg total) by mouth daily. (Patient not taking: Reported on 03/26/2019), Disp: 3 Package, Rfl: 4 .  Prenatal Vit-Fe Fumarate-FA (PRENATAL PO), Take by mouth., Disp: , Rfl:  .  Prenatal Vit-Fe Fumarate-FA (PRENATAL VITAMIN) 27-0.8 MG TABS, Take 1 tablet by mouth daily. (Patient not taking: Reported on 04/04/2019), Disp: 100 tablet, Rfl: 0   Physical Exam:   BP 110/60   Wt 160 lb (72.6 kg)   LMP 01/07/2019 (Approximate) Comment: normal  BMI 28.34 kg/m  Body mass index is 28.34 kg/m. Constitutional: Well nourished, well developed female in no acute distress.  Neck:  Supple, normal appearance, and no thyromegaly  Cardiovascular: S1, S2 normal, no murmur, rub or gallop, regular rate and rhythm Respiratory:  Clear to auscultation bilateral. Normal respiratory effort Abdomen: positive bowel sounds and no masses, hernias; diffusely non tender to palpation, non distended Breasts: breasts appear normal, no suspicious masses, no skin or nipple changes or  axillary nodes, Rigth Breast SubAreolar 2 cm NT mass. Neuro/Psych:  Normal mood and affect.  Skin:  Warm and dry.  Lymphatic:  No inguinal lymphadenopathy.   Pelvic exam: is not limited by body habitus EGBUS: within normal limits, Vagina: within normal limits and with no blood in the vault, Cervix: normal appearing cervix without discharge or lesions, closed/long/high, Uterus:  enlarged: 10 weeks, and Adnexa:  no mass, fullness, tenderness  Assessment: Crystal Randall is a 29 y.o. G2P1001 [redacted]w[redacted]d based on Patient's last menstrual period was 01/07/2019 (approximate). with an Estimated Date of Delivery: 10/14/19,  for prenatal care.  Plan:  1) Avoid alcoholic beverages. 2) Patient encouraged not to smoke.  3) Discontinue the use of all non-medicinal drugs and chemicals.  4) Take prenatal vitamins daily.  5) Seatbelt use advised 6) Nutrition, food safety (fish, cheese advisories, and high nitrite foods) and exercise discussed. 7) Hospital and practice style delivering at [facility] discussed  8) Patient is asked about travel to areas at risk for the Pillow virus, and counseled to avoid travel and exposure to mosquitoes or sexual partners who may have themselves been exposed to the virus. Testing is discussed, and will be ordered as appropriate.  9) Childbirth classes at [facility] advised 10) Genetic Screening, such as with 1st Trimester Screening, cell free fetal DNA, AFP testing, and Ultrasound, as well as with amniocentesis and CVS as appropriate, is discussed with patient. She plans to have genetic testing this pregnancy.  11) [place] for dating 67) Monitor for breast changes. As it has been biopsied, no further intervention now. 13) PAP today, likely need for biopsy and colpo again.  May even need LEEP despite pregnancy.  Pros and cons and risks discussed, including cervical cancer.  Problem list reviewed and updated.  Barnett Applebaum, MD, [facility], [facility] 04/04/2019  1:52 PM

## 2019-04-06 LAB — URINE CULTURE

## 2019-04-07 LAB — HEMOGLOBINOPATHY EVALUATION
HGB C: 0 %
HGB S: 0 %
HGB VARIANT: 0 %
Hemoglobin A2 Quantitation: 2.3 % (ref 1.8–3.2)
Hemoglobin F Quantitation: 0 % (ref 0.0–2.0)
Hgb A: 97.7 % (ref 96.4–98.8)

## 2019-04-07 LAB — RPR+RH+ABO+RUB AB+AB SCR+CB...
Antibody Screen: NEGATIVE
HIV Screen 4th Generation wRfx: NONREACTIVE
Hematocrit: 37.4 % (ref 34.0–46.6)
Hemoglobin: 11.6 g/dL (ref 11.1–15.9)
Hepatitis B Surface Ag: NEGATIVE
MCH: 22.6 pg — ABNORMAL LOW (ref 26.6–33.0)
MCHC: 31 g/dL — ABNORMAL LOW (ref 31.5–35.7)
MCV: 73 fL — ABNORMAL LOW (ref 79–97)
Platelets: 350 10*3/uL (ref 150–450)
RBC: 5.13 x10E6/uL (ref 3.77–5.28)
RDW: 16.1 % — ABNORMAL HIGH (ref 11.7–15.4)
RPR Ser Ql: NONREACTIVE
Rh Factor: POSITIVE
Rubella Antibodies, IGG: <0.9 index — ABNORMAL LOW (ref >0.99)
Varicella zoster IgG: 554 index (ref >165)
WBC: 8.3 10*3/uL (ref 3.4–10.8)

## 2019-04-08 LAB — CYTOLOGY - PAP
Chlamydia: NEGATIVE
Comment: NEGATIVE
Comment: NEGATIVE
Comment: NORMAL
Diagnosis: NEGATIVE
Neisseria Gonorrhea: NEGATIVE
Trichomonas: NEGATIVE

## 2019-04-10 LAB — MATERNIT21 PLUS CORE+SCA
Fetal Fraction: 3
Monosomy X (Turner Syndrome): NOT DETECTED
Result (T21): NEGATIVE
Trisomy 13 (Patau syndrome): NEGATIVE
Trisomy 18 (Edwards syndrome): NEGATIVE
Trisomy 21 (Down syndrome): NEGATIVE
XXX (Triple X Syndrome): NOT DETECTED
XXY (Klinefelter Syndrome): NOT DETECTED
XYY (Jacobs Syndrome): NOT DETECTED

## 2019-04-14 ENCOUNTER — Other Ambulatory Visit: Payer: Self-pay

## 2019-04-14 ENCOUNTER — Encounter: Payer: Self-pay | Admitting: Obstetrics and Gynecology

## 2019-04-14 ENCOUNTER — Ambulatory Visit (INDEPENDENT_AMBULATORY_CARE_PROVIDER_SITE_OTHER): Payer: Medicaid Other | Admitting: Obstetrics and Gynecology

## 2019-04-14 ENCOUNTER — Ambulatory Visit (INDEPENDENT_AMBULATORY_CARE_PROVIDER_SITE_OTHER): Payer: Medicaid Other

## 2019-04-14 VITALS — BP 118/74 | Wt 160.0 lb

## 2019-04-14 DIAGNOSIS — R87611 Atypical squamous cells cannot exclude high grade squamous intraepithelial lesion on cytologic smear of cervix (ASC-H): Secondary | ICD-10-CM

## 2019-04-14 DIAGNOSIS — O26891 Other specified pregnancy related conditions, first trimester: Secondary | ICD-10-CM

## 2019-04-14 DIAGNOSIS — Z3A09 9 weeks gestation of pregnancy: Secondary | ICD-10-CM

## 2019-04-14 DIAGNOSIS — Z3480 Encounter for supervision of other normal pregnancy, unspecified trimester: Secondary | ICD-10-CM | POA: Insufficient documentation

## 2019-04-14 DIAGNOSIS — N926 Irregular menstruation, unspecified: Secondary | ICD-10-CM

## 2019-04-14 DIAGNOSIS — O26841 Uterine size-date discrepancy, first trimester: Secondary | ICD-10-CM | POA: Diagnosis not present

## 2019-04-14 DIAGNOSIS — O09891 Supervision of other high risk pregnancies, first trimester: Secondary | ICD-10-CM

## 2019-04-14 DIAGNOSIS — O09899 Supervision of other high risk pregnancies, unspecified trimester: Secondary | ICD-10-CM | POA: Insufficient documentation

## 2019-04-14 NOTE — Progress Notes (Signed)
Routine Prenatal Care Visit  Subjective  Crystal Randall is a 29 y.o. G2P1001 at [redacted]w[redacted]d being seen today for ongoing prenatal care.  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  ----------------------------------------------------------------------------------- Patient reports no complaints.    . Vag. Bleeding: None.   . Leaking Fluid denies.  U/S changes EDD. ----------------------------------------------------------------------------------- The following portions of the patient's history were reviewed and updated as appropriate: allergies, current medications, past family history, past medical history, past social history, past surgical history and problem list. Problem list updated.  Objective  Blood pressure 118/74, weight 160 lb (72.6 kg), last menstrual period 01/07/2019, currently breastfeeding. Pregravid weight 160 lb (72.6 kg) Total Weight Gain 0 lb (0 kg) Urinalysis: Urine Protein    Urine Glucose    Fetal Status: Fetal Heart Rate (bpm): 174         General:  Alert, oriented and cooperative. Patient is in no acute distress.  Skin: Skin is warm and dry. No rash noted.   Cardiovascular: Normal heart rate noted  Respiratory: Normal respiratory effort, no problems with respiration noted  Abdomen: Soft, gravid, appropriate for gestational age. Pain/Pressure: Absent     Pelvic:  Cervical exam deferred        Extremities: Normal range of motion.     Mental Status: Normal mood and affect. Normal behavior. Normal judgment and thought content.   Imaging Results US OB LESS THAN 14 WEEKS WITH OB TRANSVAGINAL  Result Date: 04/14/2019 Patient Name: Crystal Randall DOB: 01/29/1991 MRN:  [id number] ULTRASOUND REPORT Location: [facility] Date of Service: 04/14/2019 Indications:dating/viability Findings: Crystal Randall intrauterine pregnancy is visualized with a CRL consistent with [redacted]w[redacted]d gestation, giving an (U/S) EDD of 11/14/2019. The (U/S) EDD is not consistent with the clinically established EDD of 10/14/2019. FHR:  174 BPM CRL measurement: 26.0 mm Yolk sac is visualized and appears normal. Amnion: visualized and appears normal Right Ovary is normal in appearance. Left Ovary is normal appearance. Corpus luteal cyst:  Right ovary Survey of the adnexa demonstrates no adnexal masses. There is no free peritoneal fluid in the cul de sac. Impression: 1. [redacted]w[redacted]d Viable Singleton Intrauterine pregnancy by U/S. 2. (U/S) EDD is not consistent with Clinically established EDD of 10/14/2019. Crystal Randall, RT There is a viable singleton gestation.  Detailed evaluation of the fetal anatomy is precluded by early gestational age.  It must be noted that a normal ultrasound particular at this early gestational age is unable to rule out fetal aneuploidy, risk of first trimester miscarriage, or anatomic birth defects. Prentice Docker, MD, [facility], [facility] 04/14/2019 4:54 PM      Assessment   29 y.o. G2P1001 at [redacted]w[redacted]d by  11/14/2019, by Ultrasound presenting for routine prenatal visit  Plan   pregnancy2 Problems (from 01/07/19 to present)    Problem Noted Resolved   Supervision of other normal pregnancy, antepartum 04/14/2019 by Will Bonnet, MD No   Short interval between pregnancies affecting pregnancy, antepartum 04/14/2019 by Will Bonnet, MD No       Preterm labor symptoms and general obstetric precautions including but not limited to vaginal bleeding, contractions, leaking of fluid and fetal movement were reviewed in detail with the patient. Please refer to After Visit Summary for other counseling  recommendations.   - repeat NIPT 1 week due to much less than  [redacted] weeks GA at time of test.   Return in about 4 weeks (around 05/12/2019) for ~1 week for labs, Routine Prenatal Appointment in 4 weeks.  Prentice Docker, MD, [facility], [facility] 04/14/2019 5:08 PM

## 2019-04-21 ENCOUNTER — Other Ambulatory Visit: Payer: Medicaid Other

## 2019-05-12 ENCOUNTER — Ambulatory Visit (INDEPENDENT_AMBULATORY_CARE_PROVIDER_SITE_OTHER): Payer: Medicaid Other | Admitting: Obstetrics & Gynecology

## 2019-05-12 ENCOUNTER — Encounter: Payer: Self-pay | Admitting: Obstetrics & Gynecology

## 2019-05-12 ENCOUNTER — Other Ambulatory Visit: Payer: Self-pay

## 2019-05-12 VITALS — BP 100/60 | Wt 158.0 lb

## 2019-05-12 DIAGNOSIS — O09899 Supervision of other high risk pregnancies, unspecified trimester: Secondary | ICD-10-CM

## 2019-05-12 DIAGNOSIS — Z3482 Encounter for supervision of other normal pregnancy, second trimester: Secondary | ICD-10-CM

## 2019-05-12 DIAGNOSIS — Z1379 Encounter for other screening for genetic and chromosomal anomalies: Secondary | ICD-10-CM

## 2019-05-12 DIAGNOSIS — Z3480 Encounter for supervision of other normal pregnancy, unspecified trimester: Secondary | ICD-10-CM

## 2019-05-12 DIAGNOSIS — Z3A13 13 weeks gestation of pregnancy: Secondary | ICD-10-CM

## 2019-05-12 DIAGNOSIS — Z3689 Encounter for other specified antenatal screening: Secondary | ICD-10-CM

## 2019-05-12 NOTE — Patient Instructions (Signed)

## 2019-05-12 NOTE — Progress Notes (Signed)
  Subjective  Fetal Movement? no Contractions? no Leaking Fluid? no Vaginal Bleeding? no Min nausea Objective  BP 100/60   Wt 158 lb (71.7 kg)   LMP 01/07/2019 (Approximate) Comment: normal  BMI 27.99 kg/m  General: NAD Pumonary: no increased work of breathing Abdomen: gravid, non-tender Extremities: no edema Psychiatric: mood appropriate, affect full  Assessment  29 y.o. G2P1001 at [redacted]w[redacted]d by  11/14/2019, by Ultrasound presenting for routine prenatal visit  Plan   Problem List Items Addressed This Visit      Other   Supervision of other normal pregnancy, antepartum   Short interval between pregnancies affecting pregnancy, antepartum    Other Visit Diagnoses    [redacted] weeks gestation of pregnancy    -  Primary   Screening, antenatal, for fetal anatomic survey       Relevant Orders   US OB Comp + 14 Wk   Encounter for genetic screening for Down Syndrome       Relevant Orders   MaterniT21 PLUS Core+SCA      pregnancy2 Problems (from 01/07/19 to present)    Problem Noted Resolved   Supervision of other normal pregnancy, antepartum 04/14/2019 by Will Bonnet, MD No   Overview Signed 05/12/2019  4:24 PM by Gae Dry, MD    [facility] Prenatal Labs  Dating LMP, [place] confirmed Blood type: B/Positive/-- (01/15 1358)   Genetic Screen    NIPS:nml XX Antibody:Negative (01/15 1358)  Anatomic [place]  Rubella: <0.90 (01/15 1358) Varicella: Immune  GTT  Third trimester:  RPR: Non Reactive (01/15 1358)   Rhogam n/a HBsAg: Negative (01/15 1358)   TDaP vaccine          Flu Shot: HIV: Non Reactive (01/15 1358)   Baby Food                                GBS:   Contraception  Pap: Plan PP  CBB  No   CS/VBAC N/a   Support Person            Short interval between pregnancies affecting pregnancy, antepartum 04/14/2019 by Will Bonnet, MD No     Repeat testing advised for NIPT as fetal fraction borderline low w initial check (3%)  Barnett Applebaum, MD, Loura Pardon  Ob/Gyn, [facility] 05/12/2019  4:35 PM

## 2019-05-18 LAB — MATERNIT21 PLUS CORE+SCA
Fetal Fraction: 6
Monosomy X (Turner Syndrome): NOT DETECTED
Result (T21): NEGATIVE
Trisomy 13 (Patau syndrome): NEGATIVE
Trisomy 18 (Edwards syndrome): NEGATIVE
Trisomy 21 (Down syndrome): NEGATIVE
XXX (Triple X Syndrome): NOT DETECTED
XXY (Klinefelter Syndrome): NOT DETECTED
XYY (Jacobs Syndrome): NOT DETECTED

## 2019-06-02 ENCOUNTER — Encounter: Payer: Medicaid Other | Admitting: Obstetrics and Gynecology

## 2019-06-02 ENCOUNTER — Telehealth: Payer: Self-pay | Admitting: Obstetrics and Gynecology

## 2019-06-02 NOTE — Telephone Encounter (Signed)
Patient was schedule for 06/25/19 for anatomy scan appointment. Patient called and requested to cancel all appointment's at this time due to not wanting to go through with the pregnancy

## 2019-06-18 ENCOUNTER — Telehealth: Payer: Self-pay | Admitting: Obstetrics & Gynecology

## 2019-06-18 NOTE — Telephone Encounter (Signed)
Patient is scheduled for in-office LEEP on 07/10/19 @ 3:30pm w/ Dr. Kenton Kingfisher. Patient scheduled this date as she needed a late appointment due to work. Patient at first thought I was calling about an OB visit, and noted she decided not to continue the pregnancy.

## 2019-06-25 ENCOUNTER — Other Ambulatory Visit: Payer: Medicaid Other

## 2019-06-25 ENCOUNTER — Encounter: Payer: Medicaid Other | Admitting: Obstetrics and Gynecology

## 2019-07-09 ENCOUNTER — Telehealth: Payer: Self-pay | Admitting: Obstetrics & Gynecology

## 2019-07-09 NOTE — Telephone Encounter (Signed)
Pt was scheduled for an in office LEEP tomorrow. It has been rescheduled for 5/7 at 2:30 with Harris.  Crystal Randall - can you please double check my "rescheduling" of this appt to make sure all is correct? It appears to be but I wanted you to look over it please.

## 2019-07-09 NOTE — Telephone Encounter (Signed)
Patient needs to reschedule LEEP procedure, unable to make 4/22 appt.

## 2019-07-10 ENCOUNTER — Ambulatory Visit: Payer: Medicaid Other | Admitting: Obstetrics & Gynecology

## 2019-07-10 NOTE — Telephone Encounter (Signed)
Noted  

## 2019-07-25 ENCOUNTER — Ambulatory Visit: Payer: Medicaid Other | Admitting: Obstetrics & Gynecology

## 2019-08-04 ENCOUNTER — Other Ambulatory Visit: Payer: Self-pay | Admitting: Obstetrics & Gynecology

## 2019-08-04 DIAGNOSIS — Z3689 Encounter for other specified antenatal screening: Secondary | ICD-10-CM

## 2019-09-15 ENCOUNTER — Emergency Department: Payer: Medicaid Other

## 2019-09-15 ENCOUNTER — Emergency Department
Admission: EM | Admit: 2019-09-15 | Discharge: 2019-09-15 | Disposition: A | Discharge status: Home / Self Care | Payer: Medicaid Other | Attending: Emergency Medicine | Admitting: Emergency Medicine

## 2019-09-15 ENCOUNTER — Other Ambulatory Visit: Payer: Self-pay

## 2019-09-15 ENCOUNTER — Encounter: Payer: Self-pay | Admitting: Emergency Medicine

## 2019-09-15 DIAGNOSIS — O99331 Smoking (tobacco) complicating pregnancy, first trimester: Secondary | ICD-10-CM | POA: Insufficient documentation

## 2019-09-15 DIAGNOSIS — Z3A01 Less than 8 weeks gestation of pregnancy: Secondary | ICD-10-CM | POA: Diagnosis not present

## 2019-09-15 DIAGNOSIS — F17210 Nicotine dependence, cigarettes, uncomplicated: Secondary | ICD-10-CM | POA: Insufficient documentation

## 2019-09-15 DIAGNOSIS — O99511 Diseases of the respiratory system complicating pregnancy, first trimester: Secondary | ICD-10-CM | POA: Diagnosis not present

## 2019-09-15 DIAGNOSIS — O209 Hemorrhage in early pregnancy, unspecified: Secondary | ICD-10-CM | POA: Diagnosis not present

## 2019-09-15 DIAGNOSIS — Z3491 Encounter for supervision of normal pregnancy, unspecified, first trimester: Secondary | ICD-10-CM

## 2019-09-15 DIAGNOSIS — O208 Other hemorrhage in early pregnancy: Secondary | ICD-10-CM | POA: Diagnosis not present

## 2019-09-15 DIAGNOSIS — N939 Abnormal uterine and vaginal bleeding, unspecified: Secondary | ICD-10-CM

## 2019-09-15 DIAGNOSIS — Z79899 Other long term (current) drug therapy: Secondary | ICD-10-CM | POA: Diagnosis not present

## 2019-09-15 DIAGNOSIS — J45909 Unspecified asthma, uncomplicated: Secondary | ICD-10-CM | POA: Insufficient documentation

## 2019-09-15 LAB — COMPREHENSIVE METABOLIC PANEL
ALT: 26 U/L (ref 0–44)
AST: 23 U/L (ref 15–41)
Albumin: 3.8 g/dL (ref 3.5–5.0)
Alkaline Phosphatase: 64 U/L (ref 38–126)
Anion gap: 8 (ref 5–15)
BUN: <5 mg/dL — ABNORMAL LOW (ref 6–20)
CO2: 24 mmol/L (ref 22–32)
Calcium: 8.9 mg/dL (ref 8.9–10.3)
Chloride: 106 mmol/L (ref 98–111)
Creatinine, Ser: 0.64 mg/dL (ref 0.44–1.00)
GFR calc Af Amer: >60 mL/min (ref >60)
GFR calc non Af Amer: >60 mL/min (ref >60)
Glucose, Bld: 83 mg/dL (ref 70–99)
Potassium: 3.5 mmol/L (ref 3.5–5.1)
Sodium: 138 mmol/L (ref 135–145)
Total Bilirubin: 0.6 mg/dL (ref 0.3–1.2)
Total Protein: 7.5 g/dL (ref 6.5–8.1)

## 2019-09-15 LAB — CBC
HCT: 34.3 % — ABNORMAL LOW (ref 36.0–46.0)
Hemoglobin: 10.8 g/dL — ABNORMAL LOW (ref 12.0–15.0)
MCH: 22 pg — ABNORMAL LOW (ref 26.0–34.0)
MCHC: 31.5 g/dL (ref 30.0–36.0)
MCV: 70 fL — ABNORMAL LOW (ref 80.0–100.0)
Platelets: 338 10*3/uL (ref 150–400)
RBC: 4.9 MIL/uL (ref 3.87–5.11)
RDW: 14.9 % (ref 11.5–15.5)
WBC: 6.2 10*3/uL (ref 4.0–10.5)
nRBC: 0 % (ref 0.0–0.2)

## 2019-09-15 LAB — HCG, QUANTITATIVE, PREGNANCY: hCG, Beta Chain, Quant, S: 5614 m[IU]/mL — ABNORMAL HIGH (ref <5)

## 2019-09-15 LAB — ABO/RH: ABO/RH(D): B POS

## 2019-09-15 LAB — LIPASE, BLOOD: Lipase: 26 U/L (ref 11–51)

## 2019-09-15 LAB — POCT PREGNANCY, URINE: Preg Test, Ur: POSITIVE — AB

## 2019-09-15 MED ORDER — PRENATAL 27-0.8 MG PO TABS: 1.0000 | ORAL_TABLET | Freq: Every morning | ORAL | 2 refills | Status: AC

## 2019-09-15 NOTE — Discharge Instructions (Signed)
Follow-up with your OBGYN in the next week  Start taking prenatal vitamins  No heavy lifting at work until cleared by [employer]

## 2019-09-15 NOTE — ED Notes (Signed)
NAD noted at time of D/C. Pt denies questions or concerns. Pt ambulatory to the lobby at this time.  

## 2019-09-15 NOTE — ED Notes (Signed)
Pt states is a G3P1L1A1. Pt states recently found out she was pregnant, LMP 1st week of May. Pt states today was doing heavy lifting at work with sudden onset lower back pain, went to use the bathroom and noted bright pink blood on the tissue, denies clots, states "there was a lot of it". Pt A&O x4, NAD noted at this time.

## 2019-09-15 NOTE — ED Provider Notes (Signed)
[facility] Emergency Department Provider Note  ____________________________________________   First MD Initiated Contact with Patient 09/15/19 1505     (approximate)  I have reviewed the triage vital signs and the nursing notes.   HISTORY  Chief Complaint Vaginal Bleeding    HPI Crystal Randall is a 29 y.o. female  G3P1 here with vaginal bleeding. Pt is currently estimated 5-[redacted] wk pregnant. She reports that over the past day, she has had new onset of a sharp lower abd pain along with light vaginal bleeding. She felt fine prior to going to work, where she was doing somewhat heavy lifting. No urinary sx. No lightheadedness or dizziness. No h/o similar issues during her prior pregnancies. No lightheadedness, dizziness. No h/o ectopic. Sx improved and resolved now.        Past Medical History:  Diagnosis Date  . Asthma   . Supervision of normal first pregnancy, antepartum 10/26/2017   [facility] Prenatal Labs Dating LMP =9wk [place] Blood type: B/Positive/-- (08/09 1604)  Genetic Screen   NIPS: normal XX Antibody:Negative (08/09 1604) Anatomic [place]  complete Rubella: <0.90 (08/09 1604) Varicella: Immune GTT  Third trimester: 84 RPR: Non Reactive (08/09 1604)  Rhogam  not needed HBsAg: Negative (08/09 1604)  TDaP vaccine    Declines                    Flu Shot:12/28/17 HIV: Non Re    Patient Active Problem List   Diagnosis Date Noted  . Supervision of other normal pregnancy, antepartum 04/14/2019  . Short interval between pregnancies affecting pregnancy, antepartum 04/14/2019  . Atypical squamous cells cannot exclude high grade squamous intraepithelial lesion on cytologic smear of cervix (ASC-H) 09/24/2018    Past Surgical History:  Procedure Laterality Date  . denies    . NO PAST SURGERIES      Prior to Admission medications   Medication Sig Start Date End Date Taking? Authorizing Provider  albuterol (PROVENTIL HFA;VENTOLIN HFA) 108 (90 Base) MCG/ACT  inhaler Inhale 2 puffs into the lungs every 6 (six) hours as needed for wheezing or shortness of breath. Patient not taking: Reported on 04/04/2019 01/30/18   Gregor Hams, MD  Prenatal Vit-Fe Fumarate-FA Reyne Dumas) 27-0.8 MG TABS tablet Take 1 tablet by mouth in the morning. 09/15/19 12/14/19  Duffy Bruce, MD    Allergies Patient has no known allergies.  No family history on file.  Social History Social History   Tobacco Use  . Smoking status: Current Every Day Smoker    Packs/day: 0.25    Years: 2.00    Pack years: 0.50    Types: Cigarettes  . Smokeless tobacco: Never Used  Vaping Use  . Vaping Use: Never used  Substance Use Topics  . Alcohol use: Yes    Comment: occasionally  . Drug use: Not Currently    Types: Barbituates    Review of Systems  Review of Systems  Constitutional: Negative for chills and fever.  HENT: Negative for sore throat.   Respiratory: Negative for shortness of breath.   Cardiovascular: Negative for chest pain.  Gastrointestinal: Negative for abdominal pain.  Genitourinary: Positive for vaginal bleeding and vaginal pain. Negative for flank pain.  Musculoskeletal: Negative for neck pain.  Skin: Negative for rash and wound.  Allergic/Immunologic: Negative for immunocompromised state.  Neurological: Negative for weakness and numbness.  Hematological: Does not bruise/bleed easily.     ____________________________________________  PHYSICAL EXAM:      VITAL SIGNS: ED  Triage Vitals  Enc Vitals Group     BP 09/15/19 1212 126/80     Pulse Rate 09/15/19 1212 95     Resp 09/15/19 1212 16     Temp 09/15/19 1212 98.9 F (37.2 C)     Temp Source 09/15/19 1212 Oral     SpO2 09/15/19 1212 100 %     Weight 09/15/19 1213 160 lb (72.6 kg)     Height 09/15/19 1213 5\' 3"  (1.6 m)     Head Circumference --      Peak Flow --      Pain Score 09/15/19 1213 6     Pain Loc --      Pain Edu? --      Excl. in [place]? --      Physical  Exam Vitals and nursing note reviewed.  Constitutional:      General: She is not in acute distress.    Appearance: She is well-developed.  HENT:     Head: Normocephalic and atraumatic.  Eyes:     Conjunctiva/sclera: Conjunctivae normal.  Cardiovascular:     Rate and Rhythm: Normal rate and regular rhythm.     Heart sounds: Normal heart sounds.  Pulmonary:     Effort: Pulmonary effort is normal. No respiratory distress.     Breath sounds: No wheezing.  Abdominal:     General: Abdomen is flat.     Tenderness: There is no abdominal tenderness.  Genitourinary:    Comments: No pelvic or adnexal TTP, no active bleeding Musculoskeletal:     Cervical back: Neck supple.  Skin:    General: Skin is warm.     Capillary Refill: Capillary refill takes less than 2 seconds.     Findings: No rash.  Neurological:     Mental Status: She is alert and oriented to person, place, and time.     Motor: No abnormal muscle tone.       ____________________________________________   LABS (all labs ordered are listed, but only abnormal results are displayed)  Labs Reviewed  COMPREHENSIVE METABOLIC PANEL - Abnormal; Notable for the following components:      Result Value   BUN <5 (*)    All other components within normal limits  CBC - Abnormal; Notable for the following components:   Hemoglobin 10.8 (*)    HCT 34.3 (*)    MCV 70.0 (*)    MCH 22.0 (*)    All other components within normal limits  HCG, QUANTITATIVE, PREGNANCY - Abnormal; Notable for the following components:   hCG, Beta Chain, Quant, S 5,614 (*)    All other components within normal limits  POCT PREGNANCY, URINE - Abnormal; Notable for the following components:   Preg Test, Ur POSITIVE (*)    All other components within normal limits  LIPASE, BLOOD  POC URINE PREG, ED  ABO/RH    ____________________________________________  EKG: None ________________________________________  RADIOLOGY All imaging, including plain  films, CT scans, and ultrasounds, independently reviewed by me, and interpretations confirmed via formal radiology reads.  ED MD interpretation:   OB: [redacted]w[redacted]d IUP, no free fluid, no signs of ectopic, too early to assess viability  Official radiology report(s): US OB LESS THAN 14 WEEKS WITH OB TRANSVAGINAL  Result Date: 09/15/2019 CLINICAL DATA:  Vaginal bleeding for 2 hours, quantitative beta hCG 5,614; no LMP provided EXAM: OBSTETRIC <14 WK [place] AND TRANSVAGINAL OB US TECHNIQUE: Both transabdominal and transvaginal ultrasound examinations were performed for complete evaluation of the gestation as  well as the maternal uterus, adnexal regions, and pelvic cul-de-sac. Transvaginal technique was performed to assess early pregnancy. COMPARISON:  None for this gestation FINDINGS: Intrauterine gestational sac: Present, single Yolk sac:  Present Embryo:  Not identified Cardiac Activity: N/A Heart Rate: N/A  bpm MSD: 5.8 mm   5 w   2 d CRL:    mm    w    d                  [place] EDC: Subchorionic hemorrhage:  Small subchronic hemorrhage Maternal uterus/adnexae: Maternal uterus otherwise unremarkable. RIGHT ovary normal size and morphology, 3.6 x 2.2 x 2.1 cm. LEFT ovary normal size and morphology, 3.6 x 2.3 x 2.4 cm. Trace free pelvic fluid. No adnexal masses. IMPRESSION: Small gestational sac seen within the uterus containing a yolk sac but no fetal pole is identified to establish viability; may consider follow-up sonography in 14 days to establish viability if clinically indicated. Small subchronic hemorrhage. Electronically Signed   By: Lavonia Dana M.D.   On: 09/15/2019 13:40    ____________________________________________  PROCEDURES   Procedure(s) performed (including Critical Care):  Procedures  ____________________________________________  INITIAL IMPRESSION / MDM / [place] / ED COURSE  As part of my medical decision making, I reviewed the following data within the electronic medical  record:  Nursing notes reviewed and incorporated, Old chart reviewed, Notes from prior ED visits, and [place] Controlled Substance Database       *Crystal Randall was evaluated in Emergency Department on 09/15/2019 for the symptoms described in the history of present illness. She was evaluated in the context of the global COVID-19 pandemic, which necessitated consideration that the patient might be at risk for infection with the SARS-CoV-2 virus that causes COVID-19. Institutional protocols and algorithms that pertain to the evaluation of patients at risk for COVID-19 are in a state of rapid change based on information released by regulatory bodies including the CDC and federal and state organizations. These policies and algorithms were followed during the patient's care in the ED.  Some ED evaluations and interventions may be delayed as a result of limited staffing during the pandemic.*     Medical Decision Making:  29 yo G3P1 at estimated [redacted] wk GA here with vaginal spotting, likely 2/2 small [place]. IUP identified on [place] - too early to assess viability. DDx includes threatened AB. No signs of ongoing or incomplete or septic AB. Hgb at baseline. Labs reassuring. Abdomen is soft. Will start on PNV, light work duty, refer to OB. Return precautions given.  ____________________________________________  FINAL CLINICAL IMPRESSION(S) / ED DIAGNOSES  Final diagnoses:  Vaginal bleeding  First trimester pregnancy     MEDICATIONS GIVEN DURING THIS VISIT:  Medications - No data to display   ED Discharge Orders         Ordered    Prenatal Vit-Fe Fumarate-FA (MULTIVITAMIN-PRENATAL) 27-0.8 MG TABS tablet  Every morning     Discontinue  Reprint     09/15/19 1623           Note:  This document was prepared using Dragon voice recognition software and may include unintentional dictation errors.   Duffy Bruce, MD 09/15/19 [phone]

## 2019-09-15 NOTE — ED Triage Notes (Signed)
Patient presents to the ED with vaginal bleeding, lower abdominal pain and back pain that began around 11am.  Patient states she had a positive pregnancy test on Friday.  Patient states she was lifting heavy objects at work for several hours prior to pain and bleeding.   Patient states bleeding was when she wiped after using the restroom.

## 2019-09-18 DIAGNOSIS — Z419 Encounter for procedure for purposes other than remedying health state, unspecified: Secondary | ICD-10-CM | POA: Diagnosis not present

## 2019-10-13 ENCOUNTER — Encounter: Payer: Medicaid Other | Admitting: Obstetrics

## 2019-10-19 DIAGNOSIS — Z419 Encounter for procedure for purposes other than remedying health state, unspecified: Secondary | ICD-10-CM | POA: Diagnosis not present

## 2019-10-22 ENCOUNTER — Encounter: Payer: Self-pay | Admitting: Advanced Practice Midwife

## 2019-10-22 ENCOUNTER — Ambulatory Visit (INDEPENDENT_AMBULATORY_CARE_PROVIDER_SITE_OTHER): Payer: Medicaid Other | Admitting: Advanced Practice Midwife

## 2019-10-22 ENCOUNTER — Other Ambulatory Visit (HOSPITAL_COMMUNITY)
Admission: RE | Admit: 2019-10-22 | Discharge: 2019-10-22 | Disposition: A | Discharge status: Home / Self Care | Payer: Medicaid Other | Source: Ambulatory Visit | Attending: Advanced Practice Midwife | Admitting: Advanced Practice Midwife

## 2019-10-22 ENCOUNTER — Other Ambulatory Visit: Payer: Self-pay

## 2019-10-22 VITALS — BP 125/74 | HR 102 | Wt 158.0 lb

## 2019-10-22 DIAGNOSIS — Z113 Encounter for screening for infections with a predominantly sexual mode of transmission: Secondary | ICD-10-CM

## 2019-10-22 DIAGNOSIS — Z3481 Encounter for supervision of other normal pregnancy, first trimester: Secondary | ICD-10-CM

## 2019-10-22 DIAGNOSIS — N6341 Unspecified lump in right breast, subareolar: Secondary | ICD-10-CM

## 2019-10-22 DIAGNOSIS — Z3A10 10 weeks gestation of pregnancy: Secondary | ICD-10-CM

## 2019-10-22 NOTE — Progress Notes (Signed)
NOB Right breast lump/soreness

## 2019-10-22 NOTE — Patient Instructions (Signed)
Exercise During Pregnancy Exercise is an important part of being healthy for people of all ages. Exercise improves the function of your heart and lungs and helps you maintain strength, flexibility, and a healthy body weight. Exercise also boosts energy levels and elevates mood. Most women should exercise regularly during pregnancy. In rare cases, women with certain medical conditions or complications may be asked to limit or avoid exercise during pregnancy. How does this affect me? Along with maintaining general strength and flexibility, exercising during pregnancy can help:  Keep strength in muscles that are used during labor and childbirth.  Decrease low back pain.  Reduce symptoms of depression.  Control weight gain during pregnancy.  Reduce the risk of needing insulin if you develop diabetes during pregnancy.  Decrease the risk of cesarean delivery.  Speed up your recovery after giving birth. How does this affect my baby? Exercise can help you have a healthy pregnancy. Exercise does not cause premature birth. It will not cause your baby to weigh less at birth. What exercises can I do? Many exercises are safe for you to do during pregnancy. Do a variety of exercises that safely increase your heart and breathing rates and help you build and maintain muscle strength. Do exercises exactly as told by your health care provider. You may do these exercises:  Walking or hiking.  Swimming.  Water aerobics.  Riding a stationary bike.  Strength training.  Modified yoga or Pilates. Tell your instructor that you are pregnant. Avoid overstretching, and avoid lying on your back for long periods of time.  Running or jogging. Only choose this type of exercise if you: ? Ran or jogged regularly before your pregnancy. ? Can run or jog and still talk in complete sentences. What exercises should I avoid? Depending on your level of fitness and whether you exercised regularly before your  pregnancy, you may be told to limit high-intensity exercise. You can tell that you are exercising at a high intensity if you are breathing much harder and faster and cannot hold a conversation while exercising. You must avoid:  Contact sports.  Activities that put you at risk for falling on or being hit in the belly, such as downhill skiing, water skiing, surfing, rock climbing, cycling, gymnastics, and horseback riding.  Scuba diving.  Skydiving.  Yoga or Pilates in a room that is heated to high temperatures.  Jogging or running, unless you ran or jogged regularly before your pregnancy. While jogging or running, you should always be able to talk in full sentences. Do not run or jog so fast that you are unable to have a conversation.  Do not exercise at more than 6,000 feet above sea level (high elevation) if you are not used to exercising at high elevation. How do I exercise in a safe way?   Avoid overheating. Do not exercise in very high temperatures.  Wear loose-fitting, breathable clothes.  Avoid dehydration. Drink enough water before, during, and after exercise to keep your urine pale yellow.  Avoid overstretching. Because of hormone changes during pregnancy, it is easy to overstretch muscles, tendons, and ligaments during pregnancy.  Start slowly and ask your health care provider to recommend the types of exercise that are safe for you.  Do not exercise to lose weight. Follow these instructions at home:  Exercise on most days or all days of the week. Try to exercise for 30 minutes a day, 5 days a week, unless your health care provider tells you not to.  If  you actively exercised before your pregnancy and you are healthy, your health care provider may tell you to continue to do moderate to high-intensity exercise.  If you are just starting to exercise or did not exercise much before your pregnancy, your health care provider may tell you to do low to moderate-intensity  exercise. Questions to ask your health care provider  Is exercise safe for me?  What are signs that I should stop exercising?  Does my health condition mean that I should not exercise during pregnancy?  When should I avoid exercising during pregnancy? Stop exercising and contact a health care provider if: You have any unusual symptoms, such as:  Mild contractions of the uterus or cramps in the abdomen.  Dizziness that does not go away when you rest. Stop exercising and get help right away if: You have any unusual symptoms, such as:  Sudden, severe pain in your low back or your belly.  Mild contractions of the uterus or cramps in the abdomen that do not improve with rest and drinking fluids.  Chest pain.  Bleeding or fluid leaking from your vagina.  Shortness of breath. These symptoms may represent a serious problem that is an emergency. Do not wait to see if the symptoms will go away. Get medical help right away. Call your local emergency services (911 in the [place] Do not drive yourself to the hospital. Summary  Most women should exercise regularly throughout pregnancy. In rare cases, women with certain medical conditions or complications may be asked to limit or avoid exercise during pregnancy.  Do not exercise to lose weight during pregnancy.  Your health care provider will tell you what level of physical activity is right for you.  Stop exercising and contact a health care provider if you have mild contractions of the uterus or cramps in the abdomen. Get help right away if these contractions or cramps do not improve with rest and drinking fluids.  Stop exercising and get help right away if you have sudden, severe pain in your low back or belly, chest pain, shortness of breath, or bleeding or leaking of fluid from your vagina. This information is not intended to replace advice given to you by your health care provider. Make sure you discuss any questions you have with your  health care provider. Document Revised: 06/27/2018 Document Reviewed: 04/10/2018 Elsevier Patient Education  2020 [employer] Eating Plan for Pregnant Women While you are pregnant, your body requires additional nutrition to help support your growing baby. You also have a higher need for some vitamins and minerals, such as folic acid, calcium, iron, and vitamin D. Eating a healthy, well-balanced diet is very important for your health and your baby's health. Your need for extra calories varies for the three 3-month segments of your pregnancy (trimesters). For most women, it is recommended to consume:  150 extra calories a day during the first trimester.  300 extra calories a day during the second trimester.  300 extra calories a day during the third trimester. What are tips for following this plan?   Do not try to lose weight or go on a diet during pregnancy.  Limit your overall intake of foods that have "empty calories." These are foods that have little nutritional value, such as sweets, desserts, candies, and sugar-sweetened beverages.  Eat a variety of foods (especially fruits and vegetables) to get a full range of vitamins and minerals.  Take a prenatal vitamin to help meet your additional vitamin and mineral needs   during pregnancy, specifically for folic acid, iron, calcium, and vitamin D.  Remember to stay active. Ask your health care provider what types of exercise and activities are safe for you.  Practice good food safety and cleanliness. Wash your hands before you eat and after you prepare raw meat. Wash all fruits and vegetables well before peeling or eating. Taking these actions can help to prevent food-borne illnesses that can be very dangerous to your baby, such as listeriosis. Ask your health care provider for more information about listeriosis. What does 150 extra calories look like? Healthy options that provide 150 extra calories each day could be any of the  following:  6-8 oz (170-230 g) of plain low-fat yogurt with  cup of berries.  1 apple with 2 teaspoons (11 g) of peanut butter.  Cut-up vegetables with  cup (60 g) of hummus.  8 oz (230 mL) or 1 cup of low-fat chocolate milk.  1 stick of string cheese with 1 medium orange.  1 peanut butter and jelly sandwich that is made with one slice of whole-wheat bread and 1 tsp (5 g) of peanut butter. For 300 extra calories, you could eat two of those healthy options each day. What is a healthy amount of weight to gain? The right amount of weight gain for you is based on your BMI before you became pregnant. If your BMI:  Was less than 18 (underweight), you should gain 28-40 lb (13-18 kg).  Was 18-24.9 (normal), you should gain 25-35 lb (11-16 kg).  Was 25-29.9 (overweight), you should gain 15-25 lb (7-11 kg).  Was 30 or greater (obese), you should gain 11-20 lb (5-9 kg). What if I am having twins or multiples? Generally, if you are carrying twins or multiples:  You may need to eat 300-600 extra calories a day.  The recommended range for total weight gain is 25-54 lb (11-25 kg), depending on your BMI before pregnancy.  Talk with your health care provider to find out about nutritional needs, weight gain, and exercise that is right for you. What foods can I eat?  Fruits All fruits. Eat a variety of colors and types of fruit. Remember to wash your fruits well before peeling or eating. Vegetables All vegetables. Eat a variety of colors and types of vegetables. Remember to wash your vegetables well before peeling or eating. Grains All grains. Choose whole grains, such as whole-wheat bread, oatmeal, or brown rice. Meats and other protein foods Lean meats, including chicken, turkey, fish, and lean cuts of beef, veal, or pork. If you eat fish or seafood, choose options that are higher in omega-3 fatty acids and lower in mercury, such as salmon, herring, mussels, trout, sardines, pollock,  shrimp, crab, and lobster. Tofu. Tempeh. Beans. Eggs. Peanut butter and other nut butters. Make sure that all meats, poultry, and eggs are cooked to food-safe temperatures or "well-done." Two or more servings of fish are recommended each week in order to get the most benefits from omega-3 fatty acids that are found in seafood. Choose fish that are lower in mercury. You can find more information online:  www.fda.gov Dairy Pasteurized milk and milk alternatives (such as almond milk). Pasteurized yogurt and pasteurized cheese. Cottage cheese. Sour cream. Beverages Water. Juices that contain 100% fruit juice or vegetable juice. Caffeine-free teas and decaffeinated coffee. Drinks that contain caffeine are okay to drink, but it is better to avoid caffeine. Keep your total caffeine intake to less than 200 mg each day (which is 12 oz   or 355 mL of coffee, tea, or soda) or the limit as told by your health care provider. Fats and oils Fats and oils are okay to include in moderation. Sweets and desserts Sweets and desserts are okay to include in moderation. Seasoning and other foods All pasteurized condiments. The items listed above may not be a complete list of foods and beverages you can eat. Contact a dietitian for more information. What foods are not recommended? Fruits Unpasteurized fruit juices. Vegetables Raw (unpasteurized) vegetable juices. Meats and other protein foods Lunch meats, bologna, hot dogs, or other deli meats. (If you must eat those meats, reheat them until they are steaming hot.) Refrigerated pat, meat spreads from a meat counter, smoked seafood that is found in the refrigerated section of a store. Raw or undercooked meats, poultry, and eggs. Raw fish, such as sushi or sashimi. Fish that have high mercury content, such as tilefish, shark, swordfish, and king mackerel. To learn more about mercury in fish, talk with your health care provider or look for online resources, such  as:  www.fda.gov Dairy Raw (unpasteurized) milk and any foods that have raw milk in them. Soft cheeses, such as feta, queso blanco, queso fresco, Brie, Camembert cheeses, blue-veined cheeses, and Panela cheese (unless it is made with pasteurized milk, which must be stated on the label). Beverages Alcohol. Sugar-sweetened beverages, such as sodas, teas, or energy drinks. Seasoning and other foods Homemade fermented foods and drinks, such as pickles, sauerkraut, or kombucha drinks. (Store-bought pasteurized versions of these are okay.) Salads that are made in a store or deli, such as ham salad, chicken salad, egg salad, tuna salad, and seafood salad. The items listed above may not be a complete list of foods and beverages you should avoid. Contact a dietitian for more information. Where to find more information To calculate the number of calories you need based on your height, weight, and activity level, you can use an online calculator such as:  www.choosemyplate.gov/MyPlatePlan To calculate how much weight you should gain during pregnancy, you can use an online pregnancy weight gain calculator such as:  www.choosemyplate.gov/pregnancy-weight-gain-calculator Summary  While you are pregnant, your body requires additional nutrition to help support your growing baby.  Eat a variety of foods, especially fruits and vegetables to get a full range of vitamins and minerals.  Practice good food safety and cleanliness. Wash your hands before you eat and after you prepare raw meat. Wash all fruits and vegetables well before peeling or eating. Taking these actions can help to prevent food-borne illnesses, such as listeriosis, that can be very dangerous to your baby.  Do not eat raw meat or fish. Do not eat fish that have high mercury content, such as tilefish, shark, swordfish, and king mackerel. Do not eat unpasteurized (raw) dairy.  Take a prenatal vitamin to help meet your additional vitamin and  mineral needs during pregnancy, specifically for folic acid, iron, calcium, and vitamin D. This information is not intended to replace advice given to you by your health care provider. Make sure you discuss any questions you have with your health care provider. Document Revised: 07/25/2018 Document Reviewed: 12/01/2016 Elsevier Patient Education  2020 [employer]. Prenatal Care Prenatal care is health care during pregnancy. It helps you and your unborn baby (fetus) stay as healthy as possible. Prenatal care may be provided by a midwife, a family practice health care provider, or a childbirth and pregnancy specialist (obstetrician). How does this affect me? During pregnancy, you will be closely monitored   for any new conditions that might develop. To lower your risk of pregnancy complications, you and your health care provider will talk about any underlying conditions you have. How does this affect my baby? Early and consistent prenatal care increases the chance that your baby will be healthy during pregnancy. Prenatal care lowers the risk that your baby will be:  Born early (prematurely).  Smaller than expected at birth (small for gestational age). What can I expect at the first prenatal care visit? Your first prenatal care visit will likely be the longest. You should schedule your first prenatal care visit as soon as you know that you are pregnant. Your first visit is a good time to talk about any questions or concerns you have about pregnancy. At your visit, you and your health care provider will talk about:  Your medical history, including: ? Any past pregnancies. ? Your family's medical history. ? The baby's father's medical history. ? Any long-term (chronic) health conditions you have and how you manage them. ? Any surgeries or procedures you have had. ? Any current over-the-counter or prescription medicines, herbs, or supplements you are taking.  Other factors that could pose a risk  to your baby, including:  Your home setting and your stress levels, including: ? Exposure to abuse or violence. ? Household financial strain. ? Mental health conditions you have.  Your daily health habits, including diet and exercise. Your health care provider will also:  Measure your weight, height, and blood pressure.  Do a physical exam, including a pelvic and breast exam.  Perform blood tests and urine tests to check for: ? Urinary tract infection. ? Sexually transmitted infections (STIs). ? Low iron levels in your blood (anemia). ? Blood type and certain proteins on red blood cells (Rh antibodies). ? Infections and immunity to viruses, such as hepatitis B and rubella. ? HIV (human immunodeficiency virus).  Do an ultrasound to confirm your baby's growth and development and to help predict your estimated due date (EDD). This ultrasound is done with a probe that is inserted into the vagina (transvaginal ultrasound).  Discuss your options for genetic screening.  Give you information about how to keep yourself and your baby healthy, including: ? Nutrition and taking vitamins. ? Physical activity. ? How to manage pregnancy symptoms such as nausea and vomiting (morning sickness). ? Infections and substances that may be harmful to your baby and how to avoid them. ? Food safety. ? Dental care. ? Working. ? Travel. ? Warning signs to watch for and when to call your health care provider. How often will I have prenatal care visits? After your first prenatal care visit, you will have regular visits throughout your pregnancy. The visit schedule is often as follows:  Up to week 28 of pregnancy: once every 4 weeks.  28-36 weeks: once every 2 weeks.  After 36 weeks: every week until delivery. Some women may have visits more or less often depending on any underlying health conditions and the health of the baby. Keep all follow-up and prenatal care visits as told by your health care  provider. This is important. What happens during routine prenatal care visits? Your health care provider will:  Measure your weight and blood pressure.  Check for fetal heart sounds.  Measure the height of your uterus in your abdomen (fundal height). This may be measured starting around week 20 of pregnancy.  Check the position of your baby inside your uterus.  Ask questions about your diet, sleeping patterns, and   whether you can feel the baby move.  Review warning signs to watch for and signs of labor.  Ask about any pregnancy symptoms you are having and how you are dealing with them. Symptoms may include: ? Headaches. ? Nausea and vomiting. ? Vaginal discharge. ? Swelling. ? Fatigue. ? Constipation. ? Any discomfort, including back or pelvic pain. Make a list of questions to ask your health care provider at your routine visits. What tests might I have during prenatal care visits? You may have blood, urine, and imaging tests throughout your pregnancy, such as:  Urine tests to check for glucose, protein, or signs of infection.  Glucose tests to check for a form of diabetes that can develop during pregnancy (gestational diabetes mellitus). This is usually done around week 24 of pregnancy.  An ultrasound to check your baby's growth and development and to check for birth defects. This is usually done around week 20 of pregnancy.  A test to check for group B strep (GBS) infection. This is usually done around week 36 of pregnancy.  Genetic testing. This may include blood or imaging tests, such as an ultrasound. Some genetic tests are done during the first trimester and some are done during the second trimester. What else can I expect during prenatal care visits? Your health care provider may recommend getting certain vaccines during pregnancy. These may include:  A yearly flu shot (annual influenza vaccine). This is especially important if you will be pregnant during flu  season.  Tdap (tetanus, diphtheria, pertussis) vaccine. Getting this vaccine during pregnancy can protect your baby from whooping cough (pertussis) after birth. This vaccine may be recommended between weeks 27 and 36 of pregnancy. Later in your pregnancy, your health care provider may give you information about:  Childbirth and breastfeeding classes.  Choosing a health care provider for your baby.  Umbilical cord banking.  Breastfeeding.  Birth control after your baby is born.  The hospital labor and delivery unit and how to tour it.  Registering at the hospital before you go into labor. Where to find more information  Office on Women's Health: womenshealth.gov  American Pregnancy Association: americanpregnancy.org  March of Dimes: marchofdimes.org Summary  Prenatal care helps you and your baby stay as healthy as possible during pregnancy.  Your first prenatal care visit will most likely be the longest.  You will have visits and tests throughout your pregnancy to monitor your health and your baby's health.  Bring a list of questions to your visits to ask your health care provider.  Make sure to keep all follow-up and prenatal care visits with your health care provider. This information is not intended to replace advice given to you by your health care provider. Make sure you discuss any questions you have with your health care provider. Document Revised: 06/26/2018 Document Reviewed: 03/05/2017 Elsevier Patient Education  2020 [employer]  

## 2019-10-24 ENCOUNTER — Encounter: Payer: Self-pay | Admitting: Advanced Practice Midwife

## 2019-10-24 DIAGNOSIS — Z3483 Encounter for supervision of other normal pregnancy, third trimester: Secondary | ICD-10-CM | POA: Insufficient documentation

## 2019-10-24 LAB — HGB FRACTIONATION CASCADE
Hgb A2: 2.8 % (ref 1.8–3.2)
Hgb A: 97.2 % (ref 96.4–98.8)
Hgb F: 0 % (ref 0.0–2.0)
Hgb S: 0 %

## 2019-10-24 LAB — RPR+RH+ABO+RUB AB+AB SCR+CB...
Antibody Screen: NEGATIVE
HIV Screen 4th Generation wRfx: NONREACTIVE
Hematocrit: 34.2 % (ref 34.0–46.6)
Hemoglobin: 10.6 g/dL — ABNORMAL LOW (ref 11.1–15.9)
Hepatitis B Surface Ag: NEGATIVE
MCH: 22.1 pg — ABNORMAL LOW (ref 26.6–33.0)
MCHC: 31 g/dL — ABNORMAL LOW (ref 31.5–35.7)
MCV: 71 fL — ABNORMAL LOW (ref 79–97)
Platelets: 324 10*3/uL (ref 150–450)
RBC: 4.79 x10E6/uL (ref 3.77–5.28)
RDW: 16.6 % — ABNORMAL HIGH (ref 11.7–15.4)
RPR Ser Ql: NONREACTIVE
Rh Factor: POSITIVE
Rubella Antibodies, IGG: <0.9 index — ABNORMAL LOW (ref >0.99)
Varicella zoster IgG: 302 index (ref >165)
WBC: 7.2 10*3/uL (ref 3.4–10.8)

## 2019-10-24 LAB — URINE CULTURE

## 2019-10-24 LAB — CERVICOVAGINAL ANCILLARY ONLY
Chlamydia: NEGATIVE
Comment: NEGATIVE
Comment: NEGATIVE
Comment: NORMAL
Neisseria Gonorrhea: NEGATIVE
Trichomonas: NEGATIVE

## 2019-10-24 NOTE — Progress Notes (Signed)
New Obstetric Patient H&P  Date of Service: 10/22/2019  Chief Complaint: "Desires prenatal care"   History of Present Illness: Patient is a 29 y.o. [phone] Not Hispanic or Latino female, presents with amenorrhea and positive home pregnancy test. Patient's last menstrual period was 08/09/2019 (within weeks). and based on her  LMP, her EDD is Estimated Date of Delivery: 05/15/20 and her EGA is [redacted]w[redacted]d. Cycles are 5 days, regular, and occur approximately every : 28 days. Her last pap smear was 7 months ago and was no abnormalities.    She had a urine pregnancy test which was positive 1 month(s)  ago. Her last menstrual period was normal and lasted for  5 day(s). Since her LMP she claims she has experienced breast tenderness, fatigue, nausea, vomiting. She denies vaginal bleeding. Her past medical history is noncontributory. Her prior pregnancies are notable for 2020 FT SVD 6# female, March 2021 TAB.  Since her LMP, she admits to the use of tobacco products  2 c/d She claims she has gained   no pounds since the start of her pregnancy.  There are cats in the home in the home  no  She admits close contact with children on a regular basis  yes  She has had chicken pox in the past yes She has had Tuberculosis exposures, symptoms, or previously tested positive for TB   no Current or past history of domestic violence. no  Genetic Screening/Teratology Counseling: (Includes patient, baby's father, or anyone in either family with:)   24. Patient's age >/= 44 at [facility]  no 2. Thalassemia ([place], [place], [place], or Asian background): MCV<80  no 3. Neural tube defect (meningomyelocele, spina bifida, anencephaly)  no 4. Congenital heart defect  no  5. Down syndrome  no 6. Tay-Sachs (Jewish, [place])  no 7. Canavan's Disease  no 8. Sickle cell disease or trait (African)  no  9. Hemophilia or other blood disorders  no  10. Muscular dystrophy  no  11. Cystic fibrosis  no  12. Huntington's  Chorea  no  13. Mental retardation/autism  no 14. Other inherited genetic or chromosomal disorder  no 15. Maternal metabolic disorder (DM, PKU, etc)  no 16. Patient or FOB with a child with a birth defect not listed above no  16a. Patient or FOB with a birth defect themselves no 17. Recurrent pregnancy loss, or stillbirth  no  18. Any medications since LMP other than prenatal vitamins (include vitamins, supplements, OTC meds, drugs, alcohol)  no 19. Any other genetic/environmental exposure to discuss  no  Infection History:   1. Lives with someone with TB or TB exposed  no  2. Patient or partner has history of genital herpes  no 3. Rash or viral illness since LMP  no 4. History of STI (GC, CT, HPV, syphilis, HIV)  no 5. History of recent travel :  no  Other pertinent information:  She reports the lump in her right breast which was previously evaluated and found to be benign lactational or possible fibroadenoma has increased in size and is now about the size of a golfball and it is tender/painful sometimes.     Review of Systems:10 point review of systems negative unless otherwise noted in HPI  Past Medical History:  Patient Active Problem List   Diagnosis Date Noted  . Encounter for supervision of normal pregnancy in first trimester 10/24/2019    Clinic [facility] Prenatal Labs  Dating  Blood type: B/Positive/-- (08/04 1147)   Genetic Screen  1 Screen:    AFP:     Quad:     NIPS: Antibody:Negative (08/04 1147)  Anatomic [place]  Rubella: <0.90 (08/04 1147)  Varicella: @VZVIGG @  GTT Early: NA               Third trimester:  RPR: Non Reactive (08/04 1147)   Rhogam NA HBsAg: Negative (08/04 1147)   Vaccines TDAP:                       Flu Shot: Covid: HIV: Non Reactive (08/04 1147)   Baby Food Breast                               GBS:   GC/CT:  Contraception  Pap: 2021 negative  CBB     CS/VBAC NA   Support Person Scott       . Short interval between pregnancies affecting  pregnancy, antepartum 04/14/2019  . Atypical squamous cells cannot exclude high grade squamous intraepithelial lesion on cytologic smear of cervix (ASC-H) 09/24/2018    Past Surgical History:  Past Surgical History:  Procedure Laterality Date  . denies    . NO PAST SURGERIES      Gynecologic History: Patient's last menstrual period was 08/09/2019 (within weeks).  Obstetric History: [id number]  Family History:  History reviewed. No pertinent family history.  Social History:  Social History   Socioeconomic History  . Marital status: Single    Spouse name: Not on file  . Number of children: Not on file  . Years of education: Not on file  . Highest education level: Not on file  Occupational History  . Not on file  Tobacco Use  . Smoking status: Current Every Day Smoker    Packs/day: 0.25    Years: 2.00    Pack years: 0.50    Types: Cigarettes  . Smokeless tobacco: Never Used  Vaping Use  . Vaping Use: Never used  Substance and Sexual Activity  . Alcohol use: Yes    Comment: occasionally  . Drug use: Not Currently    Types: Barbituates  . Sexual activity: Yes    Birth control/protection: None  Other Topics Concern  . Not on file  Social History Narrative  . Not on file   Social Determinants of Health   Financial Resource Strain:   . Difficulty of Paying Living Expenses:   Food Insecurity:   . Worried About Charity fundraiser in the Last Year:   . Arboriculturist in the Last Year:   Transportation Needs:   . Film/video editor (Medical):   Marland Kitchen Lack of Transportation (Non-Medical):   Physical Activity:   . Days of Exercise per Week:   . Minutes of Exercise per Session:   Stress:   . Feeling of Stress :   Social Connections:   . Frequency of Communication with Friends and Family:   . Frequency of Social Gatherings with Friends and Family:   . Attends Religious Services:   . Active Member of Clubs or Organizations:   . Attends Archivist  Meetings:   Marland Kitchen Marital Status:   Intimate Partner Violence:   . Fear of Current or Ex-Partner:   . Emotionally Abused:   Marland Kitchen Physically Abused:   . Sexually Abused:     Allergies:  No Known Allergies  Medications: Prior to Admission medications   Medication Sig Start Date End  Date Taking? Authorizing Provider  Prenatal Vit-Fe Fumarate-FA (MULTIVITAMIN-PRENATAL) 27-0.8 MG TABS tablet Take 1 tablet by mouth in the morning. 09/15/19 12/14/19  Duffy Bruce, MD    Physical Exam Vitals: Blood pressure 125/74, pulse (!) 102, weight 158 lb (71.7 kg), last menstrual period 08/09/2019  General: NAD HEENT: normocephalic, anicteric Thyroid: no enlargement, no palpable nodules Pulmonary: No increased work of breathing, CTAB Cardiovascular: RRR, distal pulses 2+ Breast: golf ball size lump, mobile, mildly tender to palpation, under areola Abdomen: NABS, soft, non-tender, non-distended.  Umbilicus without lesions.  No hepatomegaly, splenomegaly or masses palpable. No evidence of hernia  Genitourinary:  External: Normal external female genitalia.  Normal urethral meatus, normal Bartholin's and Skene's glands.    Vagina: Normal vaginal mucosa, no evidence of prolapse.    Cervix: not evaluated  Uterus: deferred  Adnexa: deferred  Rectal: deferred Extremities: no edema, erythema, or tenderness Neurologic: Grossly intact Psychiatric: mood appropriate, affect full   Assessment: 29 y.o. G4P1011 at [redacted]w[redacted]d by LMP presenting to initiate prenatal care  Plan: 1) Avoid alcoholic beverages. 2) Patient encouraged not to smoke.  3) Discontinue the use of all non-medicinal drugs and chemicals.  4) Take prenatal vitamins daily.  5) Nutrition, food safety (fish, cheese advisories, and high nitrite foods) and exercise discussed. 6) Hospital and practice style discussed with cross coverage system.  7) Genetic Screening, such as with 1st Trimester Screening, cell free fetal DNA, AFP testing, and Ultrasound,  as well as with amniocentesis and CVS as appropriate, is discussed with patient. At the conclusion of today's visit patient requested cell free DNA genetic testing 8) Patient is asked about travel to areas at risk for the Zika virus, and counseled to avoid travel and exposure to mosquitoes or sexual partners who may have themselves been exposed to the virus. Testing is discussed, and will be ordered as appropriate.  9) Aptima, urine culture, NOB panel, sickle cell screen today 10) Return to clinic in 1 week for dating and rob 58) MaterniT 21 at 10+ weeks 12) Diagnostic mammogram for right breast lump  Malachy Mood, MD, [place], [place] 10/24/2019, 3:58 PM

## 2019-10-29 ENCOUNTER — Ambulatory Visit (INDEPENDENT_AMBULATORY_CARE_PROVIDER_SITE_OTHER): Payer: Medicaid Other

## 2019-10-29 ENCOUNTER — Ambulatory Visit (INDEPENDENT_AMBULATORY_CARE_PROVIDER_SITE_OTHER): Payer: Medicaid Other | Admitting: Obstetrics & Gynecology

## 2019-10-29 ENCOUNTER — Encounter: Payer: Self-pay | Admitting: Obstetrics & Gynecology

## 2019-10-29 ENCOUNTER — Other Ambulatory Visit: Payer: Self-pay

## 2019-10-29 VITALS — BP 120/80 | Wt 156.0 lb

## 2019-10-29 DIAGNOSIS — Z1379 Encounter for other screening for genetic and chromosomal anomalies: Secondary | ICD-10-CM

## 2019-10-29 DIAGNOSIS — Z3481 Encounter for supervision of other normal pregnancy, first trimester: Secondary | ICD-10-CM

## 2019-10-29 DIAGNOSIS — Z3A11 11 weeks gestation of pregnancy: Secondary | ICD-10-CM

## 2019-10-29 MED ORDER — ONDANSETRON 4 MG PO TBDP
4.0000 mg | ORAL_TABLET | Freq: Four times a day (QID) | ORAL | 0 refills | Status: DC | PRN
Start: 2019-10-29 — End: 2020-03-29

## 2019-10-29 NOTE — Patient Instructions (Signed)

## 2019-10-29 NOTE — Progress Notes (Signed)
  Subjective  No pain.  Mild nausea, requesting meds  Review of ULTRASOUND.    I have personally reviewed images and report of recent ultrasound done at [facility].    Plan of management to be discussed with patient.    Keep EDC.  One baby.  Good FHT  Objective  BP 120/80   Wt 156 lb (70.8 kg)   LMP 08/09/2019 (Within Weeks) Comment: normal  BMI 27.63 kg/m  General: NAD Pumonary: no increased work of breathing Abdomen: gravid, non-tender Extremities: no edema Psychiatric: mood appropriate, affect full  Assessment  29 y.o. G4P1011 at [redacted]w[redacted]d by  05/15/2020, by Last Menstrual Period presenting for routine prenatal visit  Plan   Problem List Items Addressed This Visit      Other   Encounter for supervision of normal pregnancy in first trimester    Other Visit Diagnoses    Encounter for genetic screening for Down Syndrome    -  Primary   Relevant Orders   MaterniT21 PLUS Core+SCA   [redacted] weeks gestation of pregnancy           Barnett Applebaum, MD, [facility], [facility] 10/29/2019  2:31 PM

## 2019-11-03 ENCOUNTER — Other Ambulatory Visit: Payer: Self-pay

## 2019-11-03 ENCOUNTER — Ambulatory Visit
Admission: RE | Admit: 2019-11-03 | Discharge: 2019-11-03 | Disposition: A | Discharge status: Home / Self Care | Payer: Medicaid Other | Source: Ambulatory Visit | Attending: Advanced Practice Midwife | Admitting: Advanced Practice Midwife

## 2019-11-03 DIAGNOSIS — N6341 Unspecified lump in right breast, subareolar: Secondary | ICD-10-CM | POA: Diagnosis not present

## 2019-11-03 DIAGNOSIS — N6311 Unspecified lump in the right breast, upper outer quadrant: Secondary | ICD-10-CM | POA: Diagnosis not present

## 2019-11-03 LAB — MATERNIT21 PLUS CORE+SCA
Fetal Fraction: 8
Monosomy X (Turner Syndrome): NOT DETECTED
Result (T21): NEGATIVE
Trisomy 13 (Patau syndrome): NEGATIVE
Trisomy 18 (Edwards syndrome): NEGATIVE
Trisomy 21 (Down syndrome): NEGATIVE
XXX (Triple X Syndrome): NOT DETECTED
XXY (Klinefelter Syndrome): NOT DETECTED
XYY (Jacobs Syndrome): NOT DETECTED

## 2019-11-10 ENCOUNTER — Other Ambulatory Visit: Payer: Self-pay | Admitting: Obstetrics & Gynecology

## 2019-11-10 DIAGNOSIS — N6341 Unspecified lump in right breast, subareolar: Secondary | ICD-10-CM

## 2019-11-18 ENCOUNTER — Other Ambulatory Visit: Payer: Self-pay

## 2019-11-18 ENCOUNTER — Telehealth: Payer: Self-pay | Admitting: General Surgery

## 2019-11-18 ENCOUNTER — Encounter: Payer: Self-pay | Admitting: General Surgery

## 2019-11-18 ENCOUNTER — Telehealth: Payer: Self-pay | Admitting: Emergency Medicine

## 2019-11-18 ENCOUNTER — Ambulatory Visit (INDEPENDENT_AMBULATORY_CARE_PROVIDER_SITE_OTHER): Payer: Medicaid Other | Admitting: General Surgery

## 2019-11-18 VITALS — BP 102/66 | HR 105 | Temp 98.7°F | Resp 12 | Ht 63.0 in | Wt 153.8 lb

## 2019-11-18 DIAGNOSIS — N6310 Unspecified lump in the right breast, unspecified quadrant: Secondary | ICD-10-CM

## 2019-11-18 NOTE — Patient Instructions (Addendum)
Our surgery scheduler will contact you to schedule your surgery. Please have the [facility] Sheet available when she calls you.   We will send a Medical Clearance to Dr Kenton Kingfisher to make sure we can proceed with surgery. If he needs to see you prior to obtaining this clearance their office will contact you.   Call the office if you have any questions or concerns.

## 2019-11-18 NOTE — Telephone Encounter (Signed)
Ob-Gyn clearance sent to Dr Kenton Kingfisher.   P:[phone] F:[phone]

## 2019-11-18 NOTE — Telephone Encounter (Signed)
Pt has been advised of Pre-Admission date/time, COVID Testing date and Surgery date.  Surgery Date: 12/05/19 Preadmission Testing Date: 12/01/19 (phone 8a-1p) Covid Testing Date: 12/03/19 - patient advised to go to the [place] ([place]) between 8a-1p   Patient has been made aware to call [phone], between 1-3:00pm the day before surgery, to find out what time to arrive for surgery.

## 2019-11-18 NOTE — Progress Notes (Signed)
Patient ID: Crystal Randall, female   DOB: [birth year], 29 y.o.   MRN: [id number]  Chief Complaint  Patient presents with  . New Patient (Initial Visit)    right breast lump    HPI Crystal Randall is a 29 y.o. female.  She has been referred for further evaluation of a right breast lump.  She reports that she first noticed a small mass in her right breast in February 2020 while she was nursing.  She had an ultrasound and subsequent biopsy which was consistent with a benign process.  She did not think much of it and no follow-up was advised at that time.  She is now again pregnant and is noticing growth and increased discomfort.  She reports menarche at about 29 years of age.  She has had 3 pregnancies, the first at age 79.  She has had 1 elective abortion and as previously stated, she is currently about [redacted] weeks pregnant.  She did breast-feed her daughter.  She has previously been on oral contraceptives and reports that her maternal grandmother had breast cancer in her 80s.  She states that she does perform monthly breast exams and is able to palpate the lump.  She denies any skin changes or nipple discharge.  She does report that the breast is more tender than would be expected for just being pregnant.   Past Medical History:  Diagnosis Date  . Asthma   . Supervision of normal first pregnancy, antepartum 10/26/2017   Clinic [facility] Prenatal Labs Dating LMP =9wk [place] Blood type: B/Positive/-- (08/09 1604)  Genetic Screen   NIPS: normal XX Antibody:Negative (08/09 1604) Anatomic [place]  complete Rubella: <0.90 (08/09 1604) Varicella: Immune GTT  Third trimester: 84 RPR: Non Reactive (08/09 1604)  Rhogam  not needed HBsAg: Negative (08/09 1604)  TDaP vaccine    Declines                    Flu Shot:12/28/17 HIV: Non Re    Past Surgical History:  Procedure Laterality Date  . denies    . NO PAST SURGERIES      Family History  Problem Relation Age of Onset  . Hypertension Mother   . Cancer Maternal  Grandmother     Social History Social History   Tobacco Use  . Smoking status: Current Every Day Smoker    Packs/day: 0.25    Years: 2.00    Pack years: 0.50    Types: Cigarettes  . Smokeless tobacco: Never Used  Vaping Use  . Vaping Use: Never used  Substance Use Topics  . Alcohol use: Yes    Comment: occasionally  . Drug use: Not Currently    Types: Barbituates    No Known Allergies  Current Outpatient Medications  Medication Sig Dispense Refill  . ondansetron (ZOFRAN ODT) 4 MG disintegrating tablet Take 1 tablet (4 mg total) by mouth every 6 (six) hours as needed for nausea. 20 tablet 0  . Prenatal Vit-Fe Fumarate-FA (MULTIVITAMIN-PRENATAL) 27-0.8 MG TABS tablet Take 1 tablet by mouth in the morning. 30 tablet 2   No current facility-administered medications for this visit.    Review of Systems Review of Systems  Constitutional: Positive for fatigue.  Gastrointestinal: Positive for nausea.       Morning sickness    Blood pressure 102/66, pulse (!) 105, temperature 98.7 F (37.1 C), resp. rate 12, height 5\' 3"  (1.6 m), weight 153 lb 12.8 oz (69.8 kg), last menstrual period 08/09/2019, SpO2  97 %, unknown if currently breastfeeding. Body mass index is 27.24 kg/m.  Physical Exam Physical Exam Vitals reviewed. Exam conducted with a chaperone present.  Constitutional:      General: She is not in acute distress.    Appearance: Normal appearance. She is normal weight.  HENT:     Head: Atraumatic.     Nose:     Comments: Covered with a mask    Mouth/Throat:     Comments: Covered with a mask Neck:     Comments: No palpable cervical or supraclavicular lymphadenopathy.  The trachea is midline.  No dominant thyroid masses or thyromegaly are appreciated.  The gland was freely with deglutition. Cardiovascular:     Comments: Although she was initially tachycardic when her vital signs were taken, she has a normal rate and rhythm on my exam. Pulmonary:     Effort:  Pulmonary effort is normal.     Breath sounds: Normal breath sounds.  Chest:     Breasts:        Right: Mass present.        Left: Normal.       Comments: There is a roughly 5 cm firm rubbery mass in the right breast.  When the patient is seated, it feels like it is directly retroareolar; when she is supine, it is just to the right of the nipple areolar complex.  It is mobile and is not fixed to the overlying skin or underlying tissues.   Abdominal:     General: Bowel sounds are normal.     Palpations: Abdomen is soft.  Genitourinary:    Comments: Deferred Musculoskeletal:        General: No swelling or tenderness.  Lymphadenopathy:     Upper Body:     Right upper body: No supraclavicular, axillary or pectoral adenopathy.     Left upper body: No supraclavicular, axillary or pectoral adenopathy.  Skin:    General: Skin is warm and dry.  Neurological:     General: No focal deficit present.     Mental Status: She is alert and oriented to person, place, and time.  Psychiatric:        Mood and Affect: Mood normal.        Behavior: Behavior normal.     Data Reviewed  CLINICAL DATA:  29 year old pregnant female with a new palpable mass in the right breast.  EXAM: ULTRASOUND OF THE RIGHT BREAST  COMPARISON:  Previous exam(s).  FINDINGS: On physical exam, I palpate a discrete mass in the right breast at 10:30 in the retroareolar region.  Targeted ultrasound is performed, showing a well-circumscribed hypoechoic mass with cystic spaces in the right breast at 10:30 in the retroareolar region measuring 2.2 x 1.5 x 2.2 cm. It has internal blood flow. Sonographic evaluation of the right axilla does not show any enlarged adenopathy.  IMPRESSION: Indeterminate mass in the 10:30 region of the right breast. This may be a lactating adenoma but given that it is new palpable ultrasound-guided core biopsy is recommended.  RECOMMENDATION: Ultrasound-guided core biopsy of the  mass in the 10:30 region of the right breast is recommended.  I have discussed the findings and recommendations with the patient. Results were also provided in writing at the conclusion of the visit. If applicable, a reminder letter will be sent to the patient regarding the next appointment.  BI-RADS CATEGORY  4: Suspicious. SPECIMEN SUBMITTED:  A. Breast, right, retroareolar 10:30 axis   CLINICAL HISTORY:  29 year old  pregnant patient with a well-circumscribed hypoechoic mass  with cystic spaces in the right breast at 10:30 in the retroareolar  region measuring 2.2 cm   PRE-OPERATIVE DIAGNOSIS:  Lactating adenoma vs fibroadenoma vs CA (less likely)   POST-OPERATIVE DIAGNOSIS:  Same as above      DIAGNOSIS:  A. 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   Comment:  This may represent sampling of a fibroadenoma with superimposed  lactational and possible ischemic change. Correlation with radiographic  findings and clinical impression is required.  CLINICAL DATA:  29 year old female presenting for evaluation of a palpable lump in the retroareolar right breast. The patient noticed this previously when she was nursing in February of 2020. This was biopsied with pathology indicating fibroepithelial lesion with myxoid stroma and lactational change. She is now currently pregnant and has noticed that the mass has gotten larger, and is now causing some pain.  EXAM: ULTRASOUND OF THE RIGHT BREAST  COMPARISON:  Previous exam(s).  FINDINGS: Ultrasound targeted to the palpable site in the retroareolar right breast at 10:30 demonstrates a large slightly hypoechoic mixed cystic and solid vascular mass measuring 4.3 x 2.6 x 3.2 cm, previously 2.2 x 1.5 x 2.2 cm. Ultrasound of the right axilla demonstrates multiple normal-appearing lymph nodes.  IMPRESSION: 1. Significant  increase in size of the previously biopsied mass in the retroareolar right breast.  2.  No evidence of right axillary lymphadenopathy.  RECOMMENDATION: Surgical consultation is recommended for possible excision, which can be facilitated by her primary care physician. A repeat biopsy of the mass may be performed if determined to be clinically useful.  I have discussed the findings and recommendations with the patient. If applicable, a reminder letter will be sent to the patient regarding the next appointment.  BI-RADS CATEGORY  4: Suspicious.  I personally reviewed the imaging and concur with the radiology interpretations copied above.  Assessment This is a 29 year old woman with a growing mass in her right breast.  She is currently [redacted] weeks pregnant.  Although prior biopsy was benign, there may be potential for sampling error, given the significant growth over the past year.  Plan I have recommended that she undergo surgical excision.  As the mass is likely benign, I do not plan to perform sentinel lymph node biopsy.  I discussed with her the risks of surgery.  These include, but are not limited to, bleeding, infection, unanticipated findings that would necessitate further surgery or treatment, potential breast size discrepancy or undesirable cosmetic outcome, seroma, hematoma, wound healing complications, potential interference with ability to breast-feed.  She understands and accepts these risks.  We will reach out to her OB/GYN, Dr. Kenton Kingfisher, to confirm that he agrees it is safe for her to undergo this procedure.    Crystal Randall 11/18/2019, 11:56 AM

## 2019-11-19 DIAGNOSIS — Z419 Encounter for procedure for purposes other than remedying health state, unspecified: Secondary | ICD-10-CM | POA: Diagnosis not present

## 2019-11-26 ENCOUNTER — Encounter: Payer: Self-pay | Admitting: Advanced Practice Midwife

## 2019-11-26 ENCOUNTER — Ambulatory Visit (INDEPENDENT_AMBULATORY_CARE_PROVIDER_SITE_OTHER): Payer: Medicaid Other | Admitting: Advanced Practice Midwife

## 2019-11-26 ENCOUNTER — Other Ambulatory Visit: Payer: Self-pay

## 2019-11-26 VITALS — BP 112/70 | Wt 156.8 lb

## 2019-11-26 DIAGNOSIS — Z3482 Encounter for supervision of other normal pregnancy, second trimester: Secondary | ICD-10-CM

## 2019-11-26 DIAGNOSIS — Z3A15 15 weeks gestation of pregnancy: Secondary | ICD-10-CM | POA: Diagnosis not present

## 2019-11-26 LAB — POCT URINALYSIS DIPSTICK OB
Glucose, UA: NEGATIVE
POC,PROTEIN,UA: NEGATIVE

## 2019-11-26 NOTE — Progress Notes (Signed)
  Routine Prenatal Care Visit  Subjective  Crystal Randall is a 29 y.o. G4P1011 at [redacted]w[redacted]d being seen today for ongoing prenatal care.  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  ----------------------------------------------------------------------------------- Patient reports no complaints.  She has breast lump excision procedure scheduled for 12/05/19.  . Vag. Bleeding: None.  Movement: Present. Leaking Fluid denies.  ----------------------------------------------------------------------------------- The following portions of the patient's history were reviewed and updated as appropriate: allergies, current medications, past family history, past medical history, past social history, past surgical history and problem list. Problem list updated.  Objective  Blood pressure 112/70, weight 156 lb 12.8 oz (71.1 kg), last menstrual period 08/09/2019 Pregravid weight 160 lb (72.6 kg) Total Weight Gain -3 lb 3.2 oz (-1.452 kg) Urinalysis: Urine Protein    Urine Glucose    Fetal Status: Fetal Heart Rate (bpm): 145   Movement: Present     General:  Alert, oriented and cooperative. Patient is in no acute distress.  Skin: Skin is warm and dry. No rash noted.   Cardiovascular: Normal heart rate noted  Respiratory: Normal respiratory effort, no problems with respiration noted  Abdomen: Soft, gravid, appropriate for gestational age. Pain/Pressure: Absent     Pelvic:  Cervical exam deferred        Extremities: Normal range of motion.     Mental Status: Normal mood and affect. Normal behavior. Normal judgment and thought content.   Assessment   29 y.o. G4P1011 at [redacted]w[redacted]d by  05/15/2020, by  Last Menstrual Period presenting for routine prenatal visit  Plan   pregnancy Problems (from 10/22/19 to present)    Problem Noted Resolved   Encounter for supervision of normal pregnancy in first trimester 10/24/2019 by Rod Can, CNM No   Overview Signed 10/24/2019  2:51 PM by Rod Can, [place] Prenatal Labs  Dating  Blood type: B/Positive/-- (08/04 1147)   Genetic Screen 1 Screen:    AFP:     Quad:     NIPS: Antibody:Negative (08/04 1147)  Anatomic [place]  Rubella: <0.90 (08/04 1147)  Varicella: @VZVIGG @  GTT Early: NA               Third trimester:  RPR: Non Reactive (08/04 1147)   Rhogam NA HBsAg: Negative (08/04 1147)   Vaccines TDAP:                       Flu Shot: Covid: HIV: Non Reactive (08/04 1147)   Baby Food Breast                               GBS:   GC/CT:  Contraception  Pap: 2021 negative  CBB     CS/VBAC NA   Support Person Scott              Preterm labor symptoms and general obstetric precautions including but not limited to vaginal bleeding, contractions, leaking of fluid and fetal movement were reviewed in detail with the patient.   Return in about 4 weeks (around 12/24/2019) for anatomy scan and rob  Rod Can, [facility] 11/26/2019 2:32 PM

## 2019-11-28 ENCOUNTER — Telehealth: Payer: Self-pay | Admitting: Emergency Medicine

## 2019-11-28 ENCOUNTER — Other Ambulatory Visit: Payer: Self-pay | Admitting: Obstetrics & Gynecology

## 2019-11-28 NOTE — Progress Notes (Signed)
Phone call w pt today  Pt planning surgery for breast mass excision next week.  She is [redacted] weeks pregnant.  Pros and cons of surgery while pregnant discussed.  Recommend fetal monitoring of heart beat before and after procedure.  Anesthesia risks low but advised.  Barnett Applebaum, MD, [facility], [facility] 11/28/2019  5:12 PM

## 2019-11-28 NOTE — Telephone Encounter (Signed)
Spoke to Dr Kenton Kingfisher in regards to clearance needed from him due to patient being pregnant. New clearance form re-faxed to his office for him to fill out.

## 2019-12-01 ENCOUNTER — Other Ambulatory Visit: Payer: Self-pay

## 2019-12-01 ENCOUNTER — Other Ambulatory Visit
Admission: RE | Admit: 2019-12-01 | Discharge: 2019-12-01 | Disposition: A | Discharge status: Home / Self Care | Payer: Medicaid Other | Source: Ambulatory Visit | Attending: General Surgery | Admitting: General Surgery

## 2019-12-01 NOTE — Patient Instructions (Signed)
Your procedure is scheduled on: Friday December 05, 2019.  Report to Day Surgery inside [facility], 2nd floor. To find out your arrival time please call [phone] between 1PM - 3PM on Thursday December 04, 2019.  Remember: Instructions that are not followed completely may result in serious medical risk,  up to and including death, or upon the discretion of your surgeon and anesthesiologist your  surgery may need to be rescheduled.     _X__ 1. Do not eat food after midnight the night before your procedure.                 No chewing gum or hard candies. You may drink clear liquids up to 2 hours                 before you are scheduled to arrive for your surgery- DO not drink clear                 liquids within 2 hours of the start of your surgery.                 Clear Liquids include:  water, apple juice without pulp, clear Gatorade, G2 or                  Gatorade Zero (avoid Red/Purple/Blue), Black Coffee or Tea (Do not add                 anything to coffee or tea).  __X__2.  On the morning of surgery brush your teeth with toothpaste and water, you                may rinse your mouth with mouthwash if you wish.  Do not swallow any toothpaste of mouthwash.     _X__ 3.  No Alcohol for 24 hours before or after surgery.   _X__ 4.  Do Not Smoke or use e-cigarettes For 24 Hours Prior to Your Surgery.                 Do not use any chewable tobacco products for at least 6 hours prior to                 Surgery.  _X__  5.  Do not use any recreational drugs (marijuana, cocaine, heroin, ecstasy, MDMA or other)                For at least one week prior to your surgery.  Combination of these drugs with anesthesia                May have life threatening results.  __X__ 6.  Notify your doctor if there is any change in your medical condition      (cold, fever, infections).     Do not wear jewelry, make-up, hairpins, clips or nail polish. Do not wear lotions,  powders, or perfumes. You may wear deodorant. Do not shave 48 hours prior to surgery. Men may shave face and neck. Do not bring valuables to the hospital.    [facility] is not responsible for any belongings or valuables.  Contacts, dentures or bridgework may not be worn into surgery. Leave your suitcase in the car. After surgery it may be brought to your room. For patients admitted to the hospital, discharge time is determined by your treatment team.   Patients discharged the day of surgery will not be allowed to drive home.   Make arrangements for someone to  be with you for the first 24 hours of your Same Day Discharge.     __X__ Take these medicines the morning of surgery with A SIP OF WATER:    1. None    __X__ Use CHG Soap as directed  __X__ Stop supplements until after surgery.    __X__ Do not start any herbal supplements before your surgery.    If you have any questions regarding your pre-procedure instructions,  Please call Pre-admit Testing at [phone].

## 2019-12-01 NOTE — Progress Notes (Signed)
Received clearance from Dr Selina Cooley office. She is cleared at low risk for surgery. Recommend to assess fetal heart tones before and after surgery(L&D RN can assist).

## 2019-12-03 ENCOUNTER — Other Ambulatory Visit: Payer: Self-pay

## 2019-12-03 ENCOUNTER — Encounter: Payer: Self-pay | Admitting: Urgent Care

## 2019-12-03 ENCOUNTER — Other Ambulatory Visit
Admission: RE | Admit: 2019-12-03 | Discharge: 2019-12-03 | Disposition: A | Discharge status: Home / Self Care | Payer: Medicaid Other | Source: Ambulatory Visit | Attending: General Surgery | Admitting: General Surgery

## 2019-12-03 DIAGNOSIS — U071 COVID-19: Secondary | ICD-10-CM | POA: Insufficient documentation

## 2019-12-03 DIAGNOSIS — Z01812 Encounter for preprocedural laboratory examination: Secondary | ICD-10-CM | POA: Insufficient documentation

## 2019-12-03 LAB — SARS CORONAVIRUS 2 (TAT 6-24 HRS): SARS Coronavirus 2: POSITIVE — AB

## 2019-12-03 NOTE — Progress Notes (Signed)
  [facility] Perioperative Services: Pre-Admission/Anesthesia Testing     Date: 12/03/19  Name: Crystal Randall MRN:   [id number]  Re: CURRENT PREGNANCY and plans for surgery  Patient is scheduled to undergo a CYST EXCISION BREAST, breast mass (Right ) on 12/05/2019 with Dr. Celine Ahr. She is a [id number]. Patient will be 16 weeks and 6 days pregnant on her day of surgery.   OB/GYN has been consulted for recommendations due to current pregnancy. Per Dr. Kenton Kingfisher, "the pros and cons of surgery while pregnant discussed. Anesthesia risks low but advised". Patient cleared for procedure by OB/GYN with the request that FHTs be monitored BEFORE and AFTER the procedure. Clearance has been placed on patient's chart for day of surgery review by surgical team.   Honor Loh, MSN, APRN, FNP-C, CEN [place] Nurse Practitioner Phone: [phone] 12/03/19 11:36 AM

## 2019-12-04 ENCOUNTER — Telehealth: Payer: Self-pay | Admitting: Family

## 2019-12-04 NOTE — Telephone Encounter (Signed)
Called to Discuss with patient about Covid symptoms and the use of the monoclonal antibody infusion for those with mild to moderate Covid symptoms and at a high risk of hospitalization.     Pt appears to qualify for this infusion due to co-morbid conditions and/or a member of an at-risk group in accordance with the FDA Emergency Use Authorization.    Ms. Zellers was seen for preadmission testing on 12/03/2019 and noted to have positive Covid test.  Qualifying risk factors include BMI greater than 25 and social vulnerability.  She is currently [redacted] weeks pregnant. Lost smell/taste and little cough with symptoms starting 11/29/19. Discussed the risks/benefits of treatment with Regeneron and monoclonal antibodies. She will discuss with her obstetrician. Information sent to my chart and will follow up if she chooses to proceed with treatment.  Terri Piedra, NP 12/04/2019. 11:21 AM

## 2019-12-09 ENCOUNTER — Telehealth: Payer: Self-pay | Admitting: General Surgery

## 2019-12-09 NOTE — Telephone Encounter (Signed)
Updated information regarding rescheduled surgery.  Patient has been advised of Pre-Admission date/time, COVID Testing date and Surgery date.  Surgery Date: 12/17/19 Preadmission Testing Date: 12/01/19 (already done) Covid Testing Date: 12/03/19, tested positive, pre pre-admit will not need another test.    Patient has been made aware to call [phone], between 1-3:00pm the day before surgery, to find out what time to arrive for surgery.

## 2019-12-15 ENCOUNTER — Other Ambulatory Visit
Admission: RE | Admit: 2019-12-15 | Discharge: 2019-12-15 | Disposition: A | Discharge status: Home / Self Care | Payer: Medicaid Other | Source: Ambulatory Visit | Attending: General Surgery | Admitting: General Surgery

## 2019-12-15 NOTE — Pre-Procedure Instructions (Signed)
Patient was positive on 12/03/19 and does not need to be retested

## 2019-12-17 ENCOUNTER — Encounter: Admission: RE | Payer: Self-pay | Source: Home / Self Care

## 2019-12-17 ENCOUNTER — Ambulatory Visit: Admission: RE | Admit: 2019-12-17 | Payer: Medicaid Other | Source: Home / Self Care | Admitting: General Surgery

## 2019-12-17 SURGERY — EXCISION, CYST, BREAST
Anesthesia: General | Laterality: Right

## 2019-12-18 ENCOUNTER — Telehealth: Payer: Self-pay | Admitting: General Surgery

## 2019-12-18 NOTE — Telephone Encounter (Signed)
Updated information regarding rescheduled surgery.  Patient has been advised of Pre-Admission date/time, COVID Testing date and Surgery date. Patient cancelled last surgery the day of surgery as she states did not have transportation.    Surgery Date: 01/16/20 Preadmission Testing Date: (already completed) Covid Testing Date: Not needed per pre-admissions as patient has already tested positive for Covid.    Patient has been made aware to call [phone], between 1-3:00pm the day before surgery, to find out what time to arrive for surgery.

## 2019-12-19 DIAGNOSIS — Z419 Encounter for procedure for purposes other than remedying health state, unspecified: Secondary | ICD-10-CM | POA: Diagnosis not present

## 2019-12-24 ENCOUNTER — Ambulatory Visit (INDEPENDENT_AMBULATORY_CARE_PROVIDER_SITE_OTHER): Payer: Medicaid Other | Admitting: Advanced Practice Midwife

## 2019-12-24 ENCOUNTER — Encounter: Payer: Self-pay | Admitting: Advanced Practice Midwife

## 2019-12-24 ENCOUNTER — Ambulatory Visit (INDEPENDENT_AMBULATORY_CARE_PROVIDER_SITE_OTHER): Payer: Medicaid Other

## 2019-12-24 ENCOUNTER — Other Ambulatory Visit: Payer: Self-pay

## 2019-12-24 VITALS — BP 122/74 | Wt 155.0 lb

## 2019-12-24 DIAGNOSIS — Z3482 Encounter for supervision of other normal pregnancy, second trimester: Secondary | ICD-10-CM | POA: Diagnosis not present

## 2019-12-24 DIAGNOSIS — Z3A19 19 weeks gestation of pregnancy: Secondary | ICD-10-CM

## 2019-12-24 NOTE — Progress Notes (Addendum)
Routine Prenatal Care Visit  Subjective  Crystal Randall is a 29 y.o. G4P1011 at [redacted]w[redacted]d being seen today for ongoing prenatal care.  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  ----------------------------------------------------------------------------------- Patient reports some back pain and round ligament pain. Her breast surgery was delayed until the end of this month.    . Vag. Bleeding: None.  Movement: Present. Leaking Fluid denies.  ----------------------------------------------------------------------------------- The following portions of the patient's history were reviewed and updated as appropriate: allergies, current medications, past family history, past medical history, past social history, past surgical history and problem list. Problem list updated.  Objective  Blood pressure 122/74, weight 155 lb (70.3 kg), last menstrual period 08/09/2019, unknown if currently breastfeeding. Pregravid weight 160 lb (72.6 kg) Total Weight Gain -5 lb (-2.268 kg) Urinalysis: Urine Protein    Urine Glucose    Fetal Status: Fetal Heart Rate (bpm): 143 Fundal Height: 19 cm Movement: Present      Anatomy scan: complete, normal, female, breech, placenta anterior  General:  Alert, oriented and cooperative. Patient is in no acute distress.  Skin: Skin is warm and dry. No rash noted.   Cardiovascular: Normal heart rate noted  Respiratory: Normal respiratory effort, no problems with respiration noted  Abdomen: Soft, gravid, appropriate for gestational age. Pain/Pressure: Absent     Pelvic:  Cervical exam deferred        Extremities: Normal range of motion.  Edema: None  Mental  Status: Normal mood and affect. Normal behavior. Normal judgment and thought content.   Assessment   29 y.o. G4P1011 at [redacted]w[redacted]d by  05/15/2020, by Last Menstrual Period presenting for routine prenatal visit  Plan   pregnancy Problems (from 10/22/19 to present)    Problem Noted Resolved   Encounter for supervision of normal pregnancy in first trimester 10/24/2019 by Rod Can, CNM No   Overview Signed 10/24/2019  2:51 PM by Rod Can, [place] Prenatal Labs  Dating  Blood type: B/Positive/-- (08/04 1147)   Genetic Screen 1 Screen:    AFP:     Quad:     NIPS: Antibody:Negative (08/04 1147)  Anatomic [place]  Rubella: <0.90 (08/04 1147)  Varicella: @VZVIGG @  GTT Early: NA               Third trimester:  RPR: Non Reactive (08/04 1147)   Rhogam NA HBsAg: Negative (08/04 1147)   Vaccines TDAP:                       Flu Shot: Covid: HIV: Non Reactive (08/04 1147)   Baby Food Breast                               GBS:   GC/CT:  Contraception  Pap: 2021 negative  CBB     CS/VBAC NA   Support Person Scott              Preterm labor symptoms and general obstetric precautions including but not limited to vaginal bleeding, contractions, leaking of fluid and fetal movement were reviewed in detail with the patient.   Return in about 4 weeks (around 01/21/2020) for rob.  Rod Can, CNM 12/24/2019 4:39 PM

## 2019-12-24 NOTE — Progress Notes (Signed)
No vb. No lof.  

## 2020-01-02 IMAGING — CR DG CHEST 2V
2 series · 2 of 2 positions shown · non-contrast
Comparison: Chest radiograph January 08, 2017

CLINICAL DATA: Chest pain, shortness of breath and cough for 1
week.

EXAM:
CHEST - 2 VIEW

[chest pa]
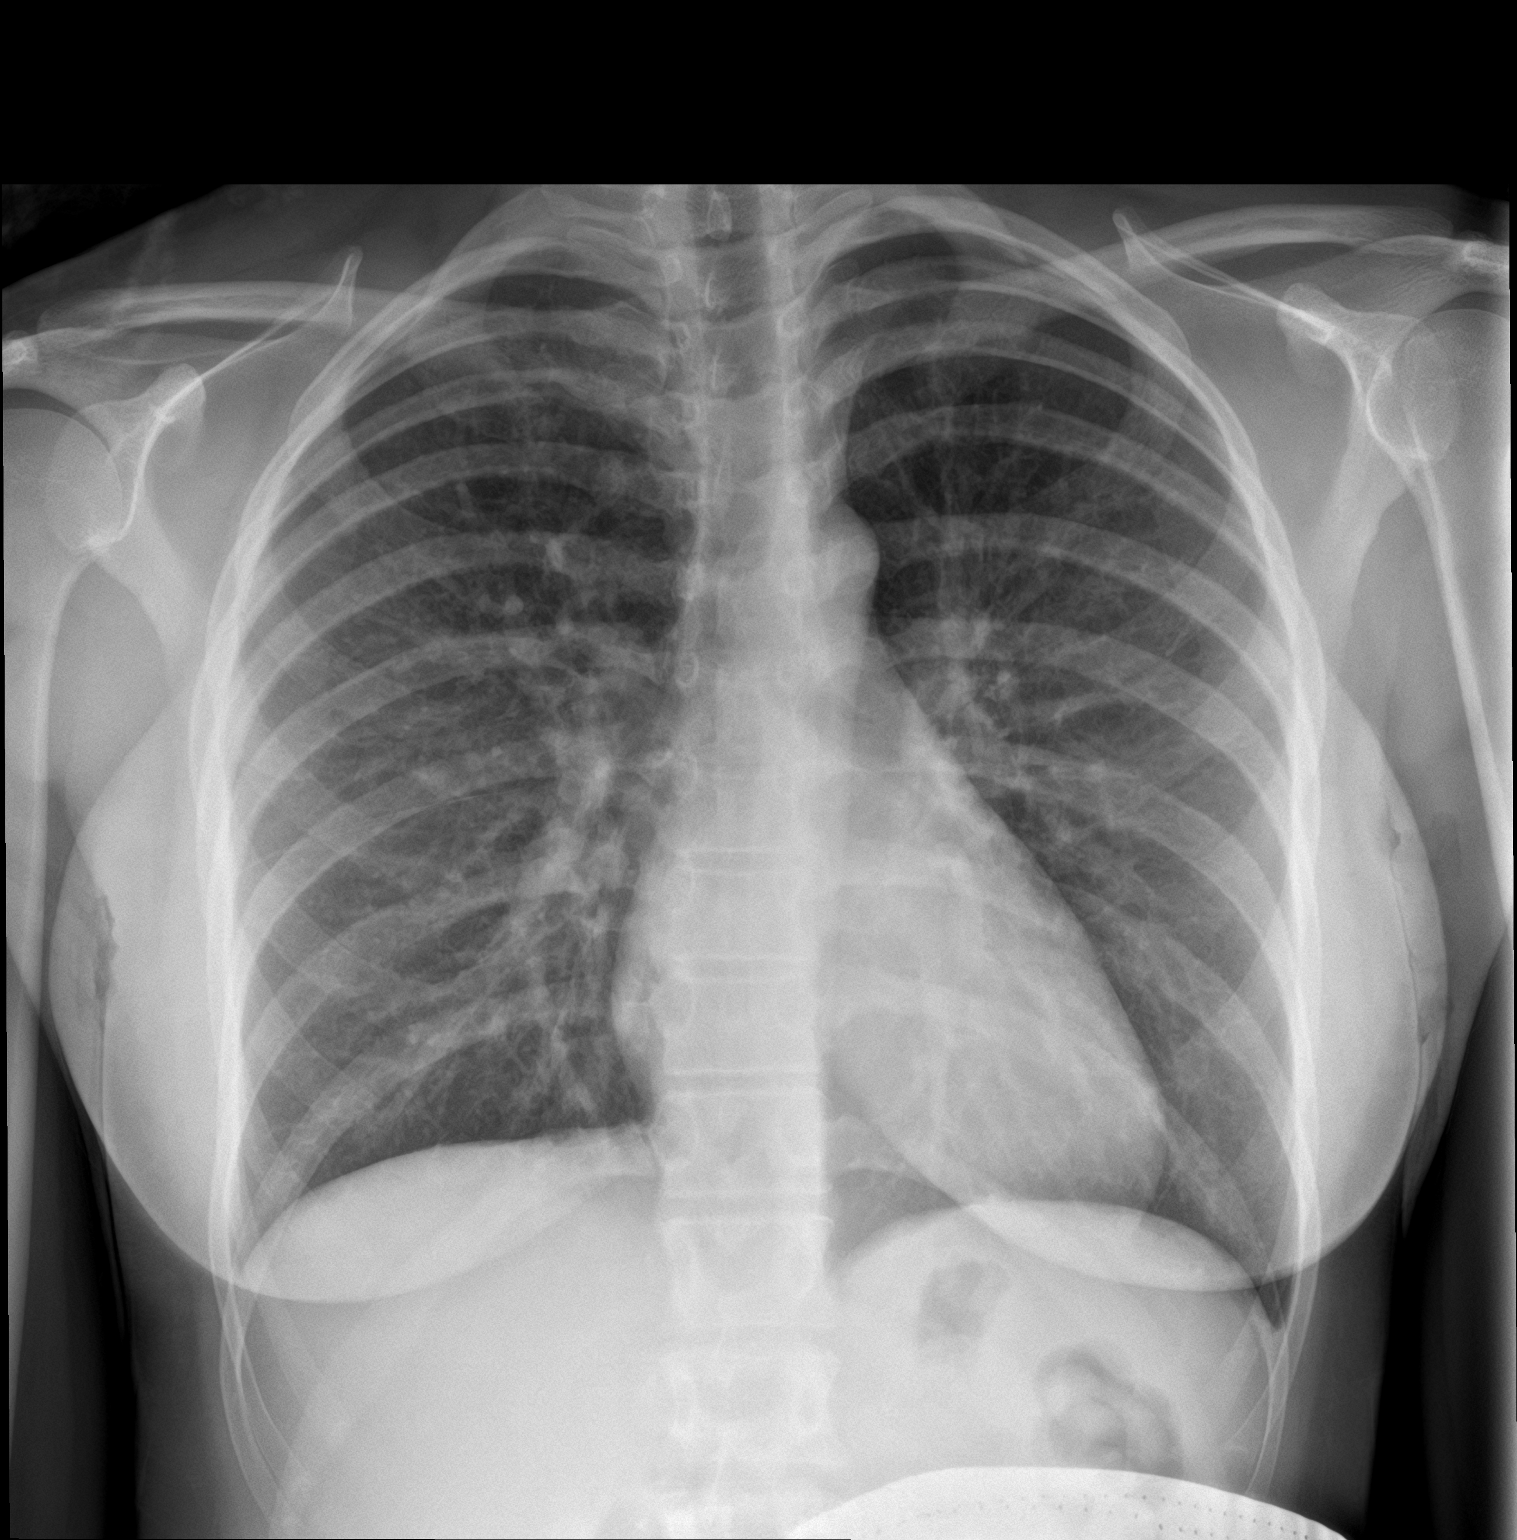

[chest lat]
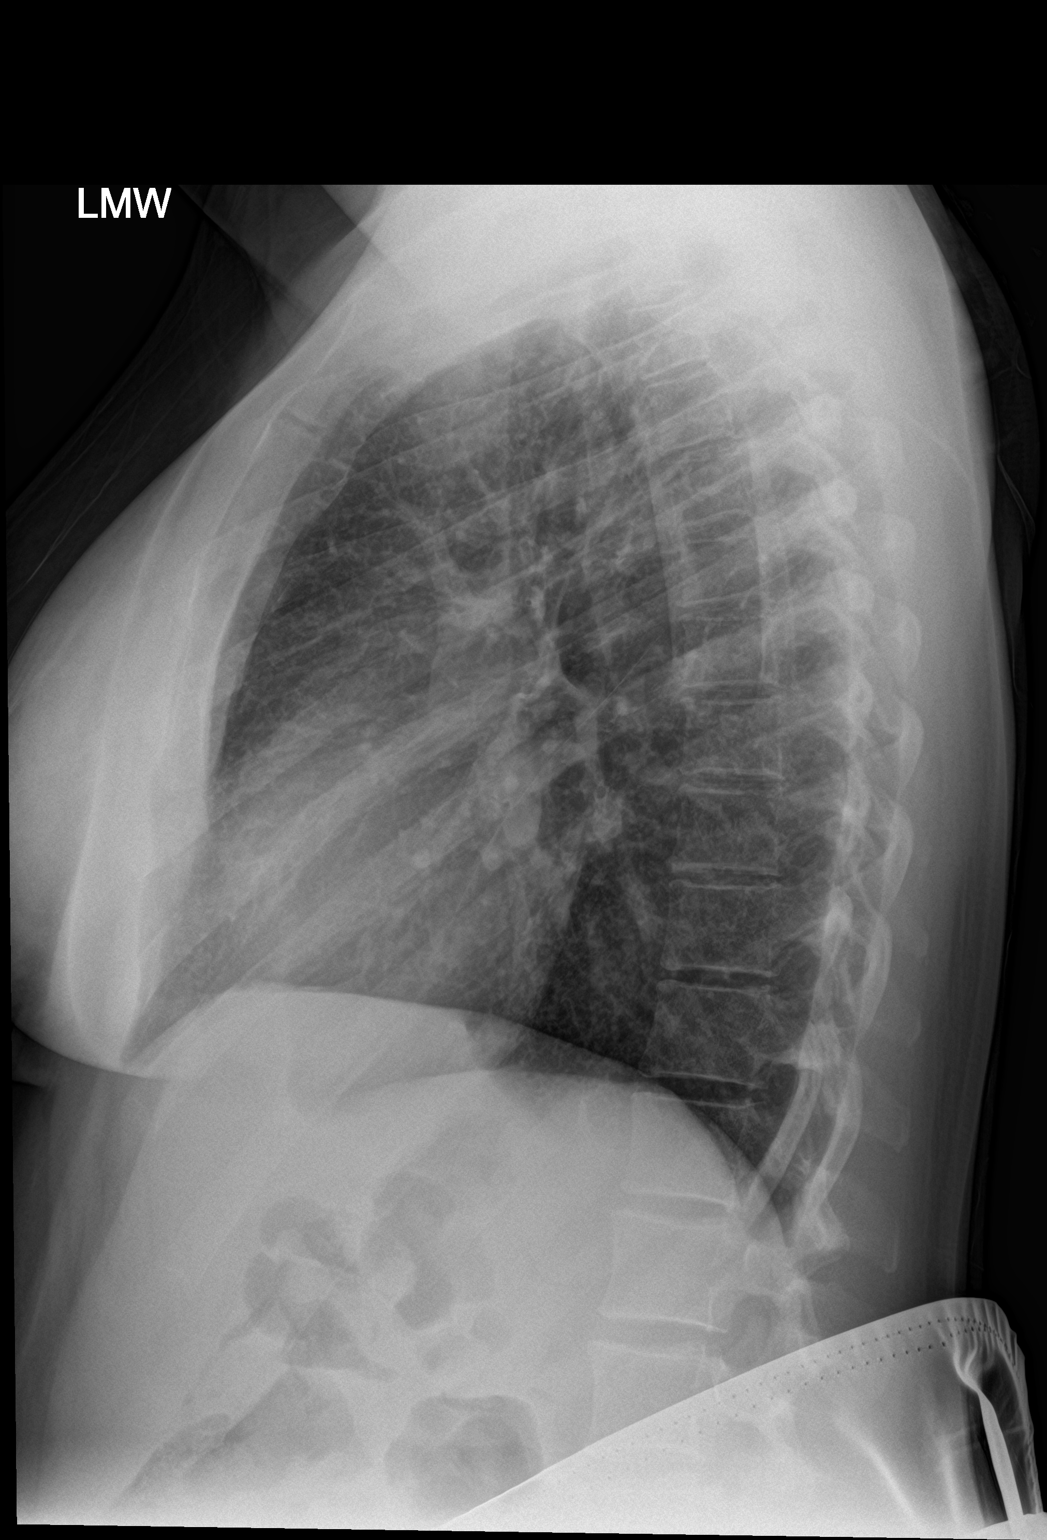

[2 of 2 positions shown; findings below may reference images not displayed]

FINDINGS: Cardiomediastinal silhouette is normal. No pleural effusions or
focal consolidations. Bronchitic changes. Trachea projects midline
and there is no pneumothorax. Soft tissue planes and included
osseous structures are non-suspicious.
IMPRESSION: Bronchitic changes without focal consolidation.

## 2020-01-03 IMAGING — CT CT ANGIO CHEST
2 of 6 series · 18 of 46 positions shown · IV contrast (APPLIED)
Comparison: None.

CLINICAL DATA: Pregnant patient with productive cough.

EXAM:
CT ANGIOGRAPHY CHEST WITH CONTRAST
TECHNIQUE: Multidetector CT imaging of the chest was performed using the
standard protocol during bolus administration of intravenous
contrast. Multiplanar CT image reconstructions and MIPs were
obtained to evaluate the vascular anatomy.
CONTRAST:  75mL OMNIPAQUE IOHEXOL 350 MG/ML SOLN

[Series 5: thins · axial · 0.64mm/px · z∈[-502,-305]mm · 15 of 217 slices shown]
[im 10/217  lung]
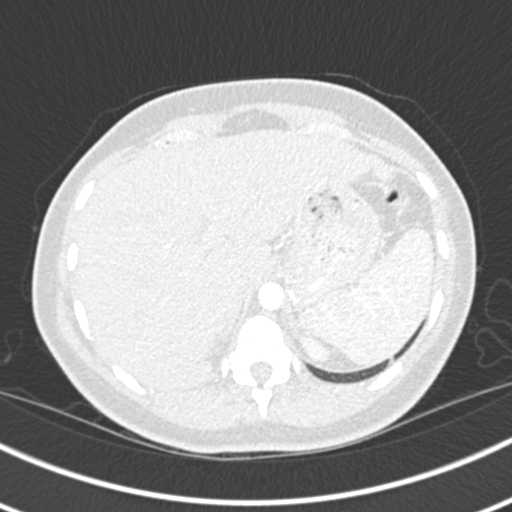
[im 29/217  soft-tissue]
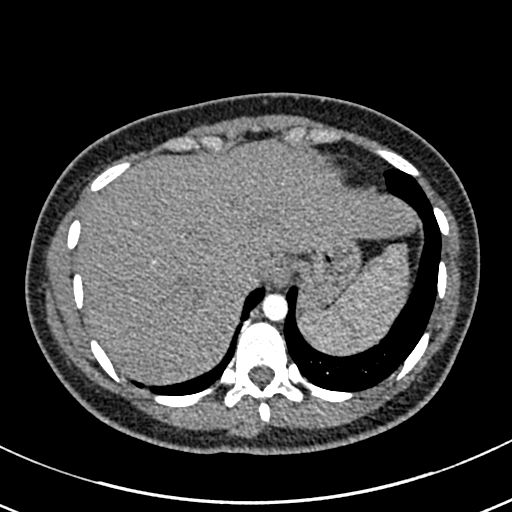
[im 38/217  lung]
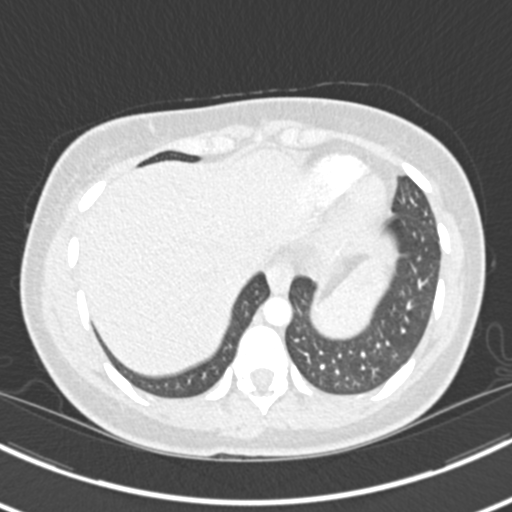
[im 57/217  soft-tissue]
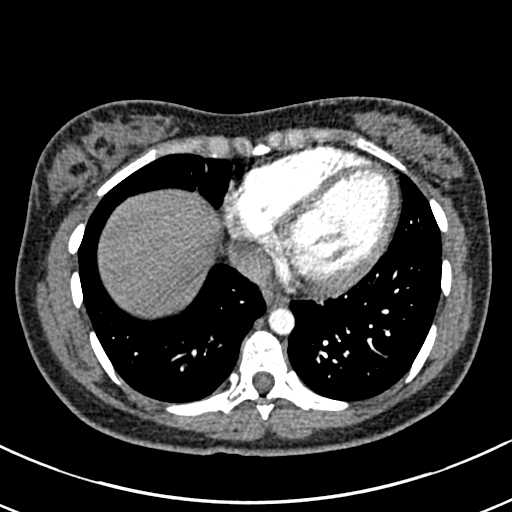
[im 66/217  lung]
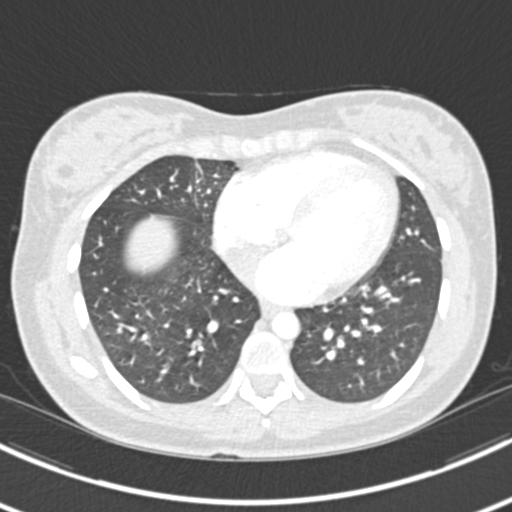
[im 85/217  soft-tissue]
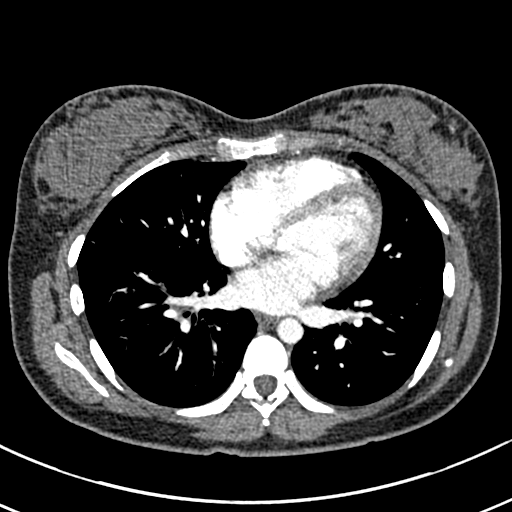
[im 94/217  lung]
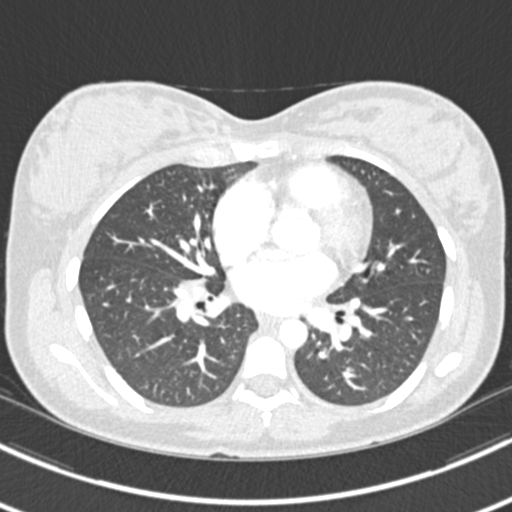
[im 113/217  soft-tissue]
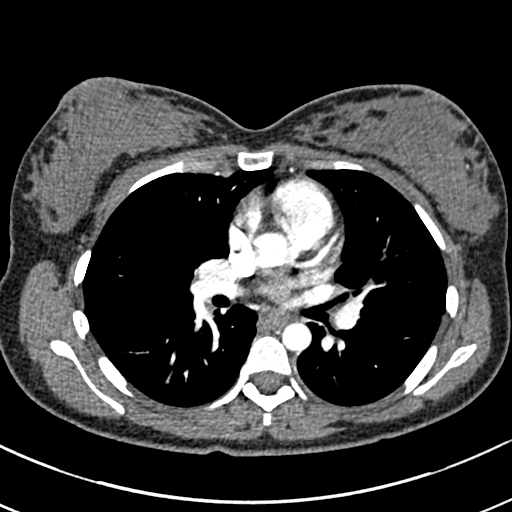
[im 123/217  lung]
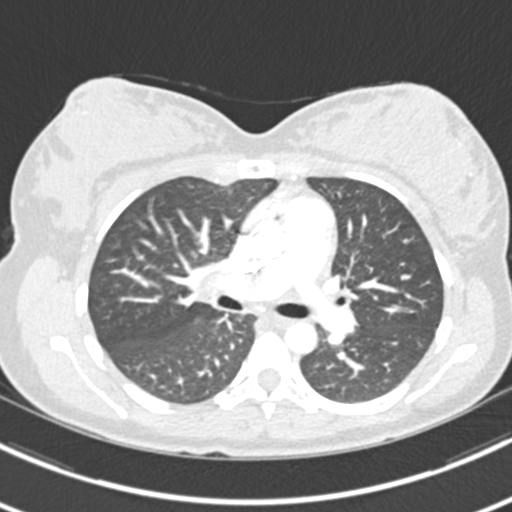
[im 132/217  soft-tissue]
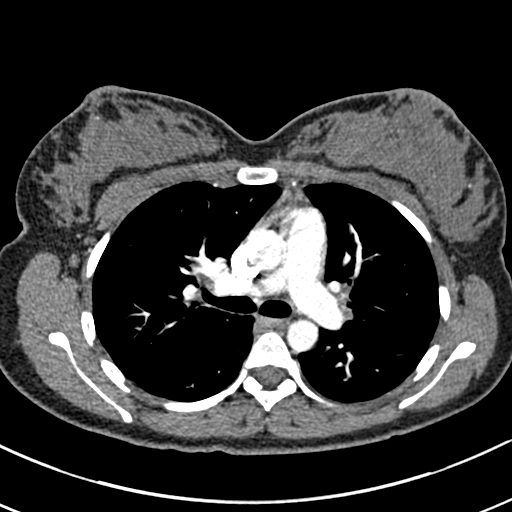
[im 151/217  lung]
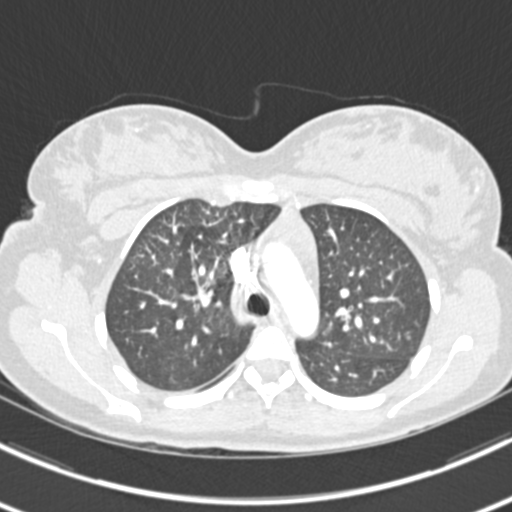
[im 160/217  soft-tissue]
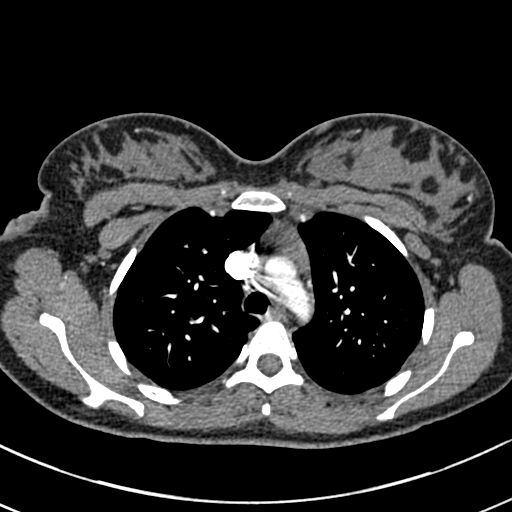
[im 179/217  lung]
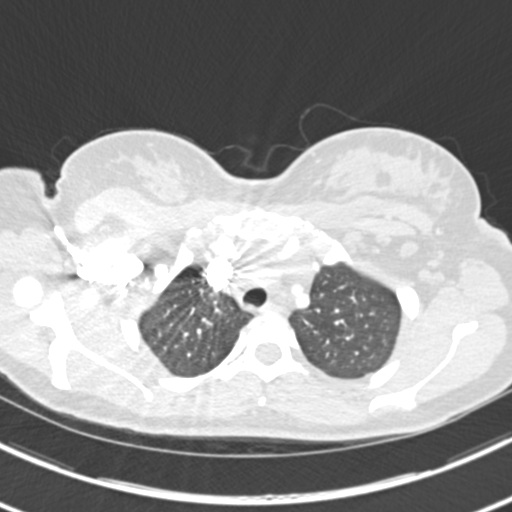
[im 188/217  soft-tissue]
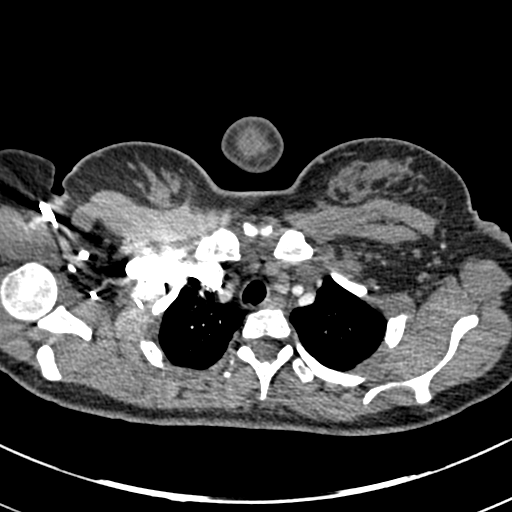
[im 207/217  lung]
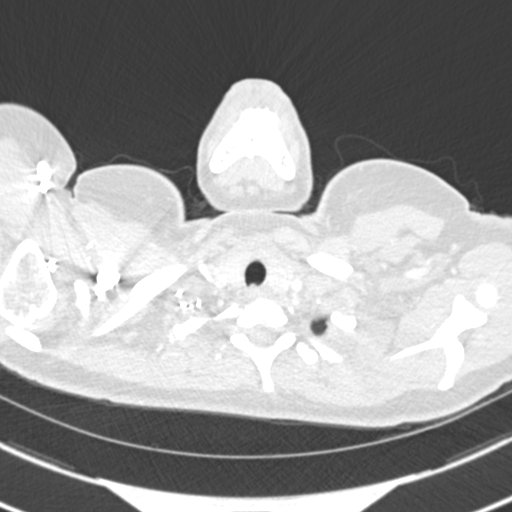

[Series 7: coronal mpr · coronal · 0.49mm/px · 3 of 72 slices shown]
[im 18/72  soft-tissue]
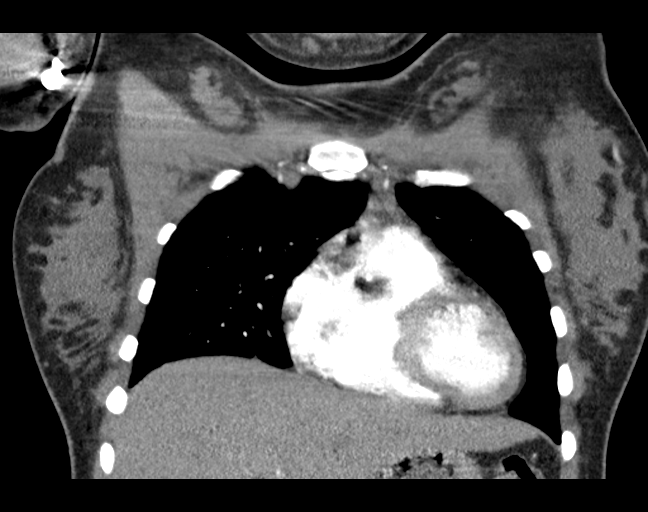
[im 36/72  soft-tissue]
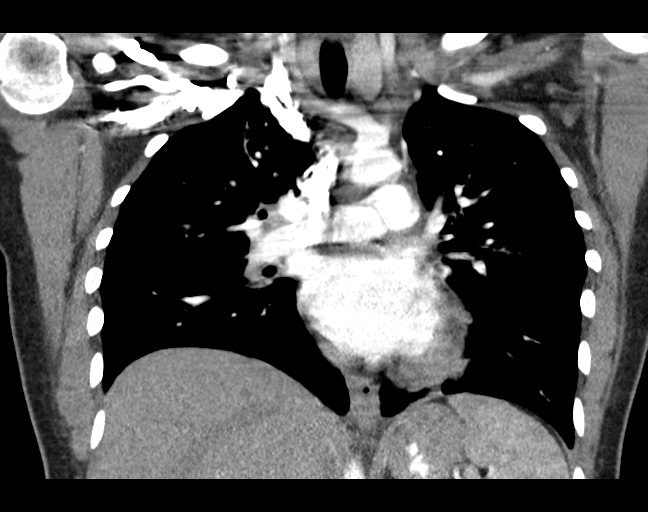
[im 54/72  soft-tissue]
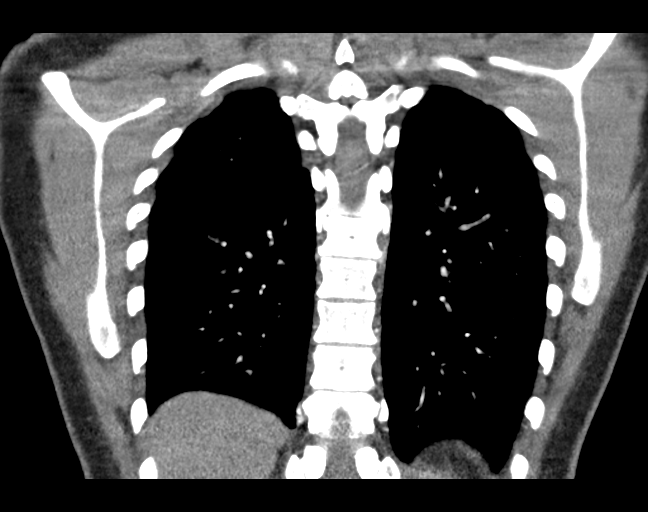

[18 of 46 positions shown; findings below may reference images not displayed]

FINDINGS: Cardiovascular: The thoracic aorta is unremarkable. The heart size
is normal. No coronary artery calcifications noted. No pulmonary
emboli identified.

Mediastinum/Nodes: A borderline node in the lateral left
retropectoral region measures 11 mm on axial image 19. The nodes in
the axilla are otherwise symmetric and mildly prominent, probably
reactive. No mediastinal adenopathy. Residual thymus identified. No
effusions. The esophagus and thyroid is normal.

Lungs/Pleura: Central airways are normal. No pneumothorax. No focal
infiltrate, nodule, or mass.

Upper Abdomen: No acute abnormality.

Musculoskeletal: No chest wall abnormality. No acute or significant
osseous findings.

Review of the MIP images confirms the above findings.
IMPRESSION: 1. No pulmonary emboli.
2. Shotty nodes in the axilla and a single mildly prominent node in
the left lateral retro pectoral region are likely reactive.

OK

## 2020-01-14 ENCOUNTER — Other Ambulatory Visit: Payer: Medicaid Other

## 2020-01-16 ENCOUNTER — Ambulatory Visit: Payer: Medicaid Other | Admitting: Anesthesiology

## 2020-01-16 ENCOUNTER — Encounter: Payer: Self-pay | Admitting: General Surgery

## 2020-01-16 ENCOUNTER — Ambulatory Visit
Admission: RE | Admit: 2020-01-16 | Discharge: 2020-01-16 | Disposition: A | Discharge status: Home / Self Care | Payer: Medicaid Other | Attending: General Surgery | Admitting: General Surgery

## 2020-01-16 ENCOUNTER — Other Ambulatory Visit: Payer: Self-pay

## 2020-01-16 ENCOUNTER — Encounter
Admission: RE | Disposition: A | Discharge status: Home / Self Care | Payer: Self-pay | Source: Home / Self Care | Attending: General Surgery

## 2020-01-16 DIAGNOSIS — D241 Benign neoplasm of right breast: Secondary | ICD-10-CM | POA: Insufficient documentation

## 2020-01-16 DIAGNOSIS — N6310 Unspecified lump in the right breast, unspecified quadrant: Secondary | ICD-10-CM | POA: Diagnosis not present

## 2020-01-16 HISTORY — PX: BREAST CYST EXCISION: SHX579

## 2020-01-16 SURGERY — EXCISION, CYST, BREAST
Anesthesia: General | Site: Breast | Laterality: Right

## 2020-01-16 MED ORDER — FENTANYL CITRATE (PF) 100 MCG/2ML IJ SOLN
INTRAMUSCULAR | Status: DC | PRN
  Administered 2020-01-16 (×4): 25 ug via INTRAVENOUS

## 2020-01-16 MED ORDER — ACETAMINOPHEN 500 MG PO TABS
ORAL_TABLET | ORAL | Status: AC
  Administered 2020-01-16: 1000 mg via ORAL
  Filled 2020-01-16: qty 2

## 2020-01-16 MED ORDER — PROPOFOL 500 MG/50ML IV EMUL
INTRAVENOUS | Status: AC
  Filled 2020-01-16: qty 50

## 2020-01-16 MED ORDER — PROPOFOL 500 MG/50ML IV EMUL
INTRAVENOUS | Status: DC | PRN
  Administered 2020-01-16: 75 ug/kg/min via INTRAVENOUS

## 2020-01-16 MED ORDER — HYDROCODONE-ACETAMINOPHEN 5-325 MG PO TABS: 1.0000 | ORAL_TABLET | Freq: Four times a day (QID) | ORAL | 0 refills | Status: DC | PRN

## 2020-01-16 MED ORDER — ONDANSETRON HCL 4 MG/2ML IJ SOLN
INTRAMUSCULAR | Status: DC | PRN
  Administered 2020-01-16: 4 mg via INTRAVENOUS

## 2020-01-16 MED ORDER — DEXMEDETOMIDINE HCL IN NACL 200 MCG/50ML IV SOLN
INTRAVENOUS | Status: DC | PRN
  Administered 2020-01-16: 4 ug via INTRAVENOUS
  Administered 2020-01-16: 12 ug via INTRAVENOUS

## 2020-01-16 MED ORDER — LIDOCAINE-EPINEPHRINE 1 %-1:100000 IJ SOLN
INTRAMUSCULAR | Status: AC
  Filled 2020-01-16: qty 1

## 2020-01-16 MED ORDER — IPRATROPIUM-ALBUTEROL 0.5-2.5 (3) MG/3ML IN SOLN
RESPIRATORY_TRACT | Status: AC
  Administered 2020-01-16: 3 mL via RESPIRATORY_TRACT
  Filled 2020-01-16: qty 3

## 2020-01-16 MED ORDER — LIDOCAINE HCL (PF) 2 % IJ SOLN
INTRAMUSCULAR | Status: AC
  Filled 2020-01-16: qty 5

## 2020-01-16 MED ORDER — ONDANSETRON HCL 4 MG/2ML IJ SOLN
INTRAMUSCULAR | Status: AC
  Filled 2020-01-16: qty 2

## 2020-01-16 MED ORDER — DEXAMETHASONE SODIUM PHOSPHATE 10 MG/ML IJ SOLN
INTRAMUSCULAR | Status: AC
  Filled 2020-01-16: qty 1

## 2020-01-16 MED ORDER — SODIUM CHLORIDE 0.9 % IV SOLN
INTRAVENOUS | Status: DC | PRN
  Administered 2020-01-16: 50 ug/min via INTRAVENOUS

## 2020-01-16 MED ORDER — BUPIVACAINE HCL (PF) 0.25 % IJ SOLN
INTRAMUSCULAR | Status: AC
  Filled 2020-01-16: qty 30

## 2020-01-16 MED ORDER — CHLORHEXIDINE GLUCONATE CLOTH 2 % EX PADS: 6.0000 | MEDICATED_PAD | Freq: Once | CUTANEOUS | Status: DC

## 2020-01-16 MED ORDER — LACTATED RINGERS IV SOLN: INTRAVENOUS | Status: DC

## 2020-01-16 MED ORDER — FENTANYL CITRATE (PF) 100 MCG/2ML IJ SOLN
INTRAMUSCULAR | Status: AC
  Filled 2020-01-16: qty 2

## 2020-01-16 MED ORDER — SUCCINYLCHOLINE CHLORIDE 200 MG/10ML IV SOSY
PREFILLED_SYRINGE | INTRAVENOUS | Status: AC
  Filled 2020-01-16: qty 10

## 2020-01-16 MED ORDER — PROPOFOL 10 MG/ML IV BOLUS
INTRAVENOUS | Status: DC | PRN
  Administered 2020-01-16: 120 mg via INTRAVENOUS

## 2020-01-16 MED ORDER — FENTANYL CITRATE (PF) 100 MCG/2ML IJ SOLN
INTRAMUSCULAR | Status: AC
  Administered 2020-01-16: 25 ug via INTRAVENOUS
  Filled 2020-01-16: qty 2

## 2020-01-16 MED ORDER — HYDROCODONE-ACETAMINOPHEN 5-325 MG PO TABS
1.0000 | ORAL_TABLET | Freq: Once | ORAL | Status: AC
  Administered 2020-01-16: 1 via ORAL

## 2020-01-16 MED ORDER — LIDOCAINE-EPINEPHRINE 1 %-1:100000 IJ SOLN
INTRAMUSCULAR | Status: DC | PRN
  Administered 2020-01-16: 6 mL

## 2020-01-16 MED ORDER — ROCURONIUM BROMIDE 100 MG/10ML IV SOLN
INTRAVENOUS | Status: DC | PRN
  Administered 2020-01-16: 5 mg via INTRAVENOUS
  Administered 2020-01-16: 10 mg via INTRAVENOUS

## 2020-01-16 MED ORDER — ONDANSETRON HCL 4 MG/2ML IJ SOLN: 4.0000 mg | Freq: Once | INTRAMUSCULAR | Status: DC | PRN

## 2020-01-16 MED ORDER — PHENYLEPHRINE HCL (PRESSORS) 10 MG/ML IV SOLN
INTRAVENOUS | Status: DC | PRN
  Administered 2020-01-16 (×4): 100 ug via INTRAVENOUS

## 2020-01-16 MED ORDER — ACETAMINOPHEN 500 MG PO TABS: 1000.0000 mg | ORAL_TABLET | Freq: Four times a day (QID) | ORAL | 0 refills | Status: AC | PRN

## 2020-01-16 MED ORDER — FENTANYL CITRATE (PF) 100 MCG/2ML IJ SOLN
25.0000 ug | INTRAMUSCULAR | Status: DC | PRN
  Administered 2020-01-16 (×3): 25 ug via INTRAVENOUS

## 2020-01-16 MED ORDER — CEFAZOLIN SODIUM-DEXTROSE 2-4 GM/100ML-% IV SOLN
INTRAVENOUS | Status: AC
  Filled 2020-01-16: qty 100

## 2020-01-16 MED ORDER — SUCCINYLCHOLINE CHLORIDE 20 MG/ML IJ SOLN
INTRAMUSCULAR | Status: DC | PRN
  Administered 2020-01-16: 100 mg via INTRAVENOUS

## 2020-01-16 MED ORDER — CHLORHEXIDINE GLUCONATE 0.12 % MT SOLN: 15.0000 mL | Freq: Once | OROMUCOSAL | Status: DC

## 2020-01-16 MED ORDER — ROCURONIUM BROMIDE 10 MG/ML (PF) SYRINGE
PREFILLED_SYRINGE | INTRAVENOUS | Status: AC
  Filled 2020-01-16: qty 10

## 2020-01-16 MED ORDER — FAMOTIDINE 20 MG PO TABS: 20.0000 mg | ORAL_TABLET | Freq: Once | ORAL | Status: DC

## 2020-01-16 MED ORDER — ACETAMINOPHEN 500 MG PO TABS: 1000.0000 mg | ORAL_TABLET | ORAL | Status: AC

## 2020-01-16 MED ORDER — PROPOFOL 10 MG/ML IV BOLUS
INTRAVENOUS | Status: AC
  Filled 2020-01-16: qty 20

## 2020-01-16 MED ORDER — CEFAZOLIN SODIUM-DEXTROSE 2-4 GM/100ML-% IV SOLN
2.0000 g | INTRAVENOUS | Status: AC
  Administered 2020-01-16: 2 g via INTRAVENOUS

## 2020-01-16 MED ORDER — DEXAMETHASONE SODIUM PHOSPHATE 10 MG/ML IJ SOLN
INTRAMUSCULAR | Status: DC | PRN
  Administered 2020-01-16: 10 mg via INTRAVENOUS

## 2020-01-16 MED ORDER — IPRATROPIUM-ALBUTEROL 0.5-2.5 (3) MG/3ML IN SOLN: 3.0000 mL | Freq: Once | RESPIRATORY_TRACT | Status: AC

## 2020-01-16 MED ORDER — ORAL CARE MOUTH RINSE: 15.0000 mL | Freq: Once | OROMUCOSAL | Status: DC

## 2020-01-16 MED ORDER — HYDROCODONE-ACETAMINOPHEN 5-325 MG PO TABS
ORAL_TABLET | ORAL | Status: AC
  Filled 2020-01-16: qty 1

## 2020-01-16 SURGICAL SUPPLY — 47 items
ADH SKN CLS APL DERMABOND .7 (GAUZE/BANDAGES/DRESSINGS) ×1
APL PRP STRL LF DISP 70% ISPRP (MISCELLANEOUS) ×1
BINDER BREAST LRG (GAUZE/BANDAGES/DRESSINGS) IMPLANT
BINDER BREAST MEDIUM (GAUZE/BANDAGES/DRESSINGS) ×2 IMPLANT
BINDER BREAST XLRG (GAUZE/BANDAGES/DRESSINGS) IMPLANT
BINDER BREAST XXLRG (GAUZE/BANDAGES/DRESSINGS) IMPLANT
BLADE SURG 15 STRL LF DISP TIS (BLADE) ×2 IMPLANT
BLADE SURG 15 STRL SS (BLADE) ×4
CANISTER SUCT 1200ML W/VALVE (MISCELLANEOUS) ×2 IMPLANT
CHLORAPREP W/TINT 26 (MISCELLANEOUS) ×2 IMPLANT
CLIP VESOCCLUDE SM WIDE 6/CT (CLIP) IMPLANT
CNTNR SPEC 2.5X3XGRAD LEK (MISCELLANEOUS) ×1
CONT SPEC 4OZ STER OR WHT (MISCELLANEOUS) ×1
CONT SPEC 4OZ STRL OR WHT (MISCELLANEOUS) ×1
CONTAINER SPEC 2.5X3XGRAD LEK (MISCELLANEOUS) ×1 IMPLANT
COVER WAND RF STERILE (DRAPES) ×2 IMPLANT
DERMABOND ADVANCED (GAUZE/BANDAGES/DRESSINGS) ×1
DERMABOND ADVANCED .7 DNX12 (GAUZE/BANDAGES/DRESSINGS) ×1 IMPLANT
DEVICE DSSCT PLSMBLD 3.0S LGHT (MISCELLANEOUS) IMPLANT
DRAPE LAPAROTOMY 77X122 PED (DRAPES) ×2 IMPLANT
DRSG GAUZE FLUFF 36X18 (GAUZE/BANDAGES/DRESSINGS) ×2 IMPLANT
ELECT CAUTERY BLADE TIP 2.5 (TIP) ×2
ELECT REM PT RETURN 9FT ADLT (ELECTROSURGICAL) ×2
ELECTRODE CAUTERY BLDE TIP 2.5 (TIP) ×1 IMPLANT
ELECTRODE REM PT RTRN 9FT ADLT (ELECTROSURGICAL) ×1 IMPLANT
GLOVE BIO SURGEON STRL SZ 6.5 (GLOVE) ×2 IMPLANT
GLOVE INDICATOR 7.0 STRL GRN (GLOVE) ×4 IMPLANT
GOWN STRL REUS W/ TWL LRG LVL3 (GOWN DISPOSABLE) ×2 IMPLANT
GOWN STRL REUS W/TWL LRG LVL3 (GOWN DISPOSABLE) ×4
KIT MARKER MARGIN INK (KITS) IMPLANT
KIT TURNOVER KIT A (KITS) ×2 IMPLANT
LABEL OR SOLS (LABEL) ×2 IMPLANT
MARKER MARGIN CORRECT CLIP (MARKER) IMPLANT
NEEDLE HYPO 25X1 1.5 SAFETY (NEEDLE) ×2 IMPLANT
PACK BASIN MINOR (MISCELLANEOUS) ×2 IMPLANT
PLASMABLADE 3.0S W/LIGHT (MISCELLANEOUS)
STRIP CLOSURE SKIN 1/2X4 (GAUZE/BANDAGES/DRESSINGS) ×4 IMPLANT
SUT MNCRL 4-0 (SUTURE) ×2
SUT MNCRL 4-0 27XMFL (SUTURE) ×1
SUT SILK 2 0 SH (SUTURE) ×2 IMPLANT
SUT VIC AB 3-0 SH 27 (SUTURE) ×2
SUT VIC AB 3-0 SH 27X BRD (SUTURE) ×1 IMPLANT
SUTURE MNCRL 4-0 27XMF (SUTURE) ×1 IMPLANT
SYR 10ML LL (SYRINGE) ×2 IMPLANT
SYR BULB IRRIG 60ML STRL (SYRINGE) ×2 IMPLANT
TUBING CONNECTING 10 (TUBING) ×2 IMPLANT
WATER STERILE IRR 1000ML POUR (IV SOLUTION) ×2 IMPLANT

## 2020-01-16 NOTE — Anesthesia Preprocedure Evaluation (Signed)
Anesthesia Evaluation  Patient identified by MRN, date of birth, ID band Patient awake    Reviewed: Allergy & Precautions, NPO status , Patient's Chart, lab work & pertinent test results  History of Anesthesia Complications Negative for: history of anesthetic complications  Airway Mallampati: II       Dental   Pulmonary asthma , neg sleep apnea, neg COPD, Current Smoker and Patient abstained from smoking.,           Cardiovascular (-) hypertension(-) Past MI and (-) CHF (-) dysrhythmias (-) Valvular Problems/Murmurs     Neuro/Psych neg Seizures    GI/Hepatic Neg liver ROS, GERD (with pregnancy)  ,  Endo/Other  neg diabetes  Renal/GU negative Renal ROS     Musculoskeletal   Abdominal   Peds  Hematology   Anesthesia Other Findings   Reproductive/Obstetrics (+) Pregnancy                             Anesthesia Physical Anesthesia Plan  ASA: II  Anesthesia Plan: General   Post-op Pain Management:    Induction: Intravenous  PONV Risk Score and Plan: 2 and Propofol infusion and TIVA  Airway Management Planned: Oral ETT  Additional Equipment:   Intra-op Plan:   Post-operative Plan:   Informed Consent: I have reviewed the patients History and Physical, chart, labs and discussed the procedure including the risks, benefits and alternatives for the proposed anesthesia with the patient or authorized representative who has indicated his/her understanding and acceptance.       Plan Discussed with:   Anesthesia Plan Comments:         Anesthesia Quick Evaluation

## 2020-01-16 NOTE — Discharge Instructions (Signed)
Fibroadenoma Fibroadenoma is a breast tumor that is not cancerous (is benign). These tumors are made up of breast tissue and the tissue that holds breast tissue together (connective tissue). There are several types:  Simple fibroadenoma. This is the most common type. It contains only one type of tissue.  Complex fibroadenoma. This type contains more than one kind of tissue or irregular tissue, such as pockets of fluid (cysts) or deposits of calcium (calcifications) in the breast.  Juvenile fibroadenoma. This is a type of tumor that can develop in adolescent girls. It tends to grow larger over time than other adenomas. Although fibroadenomas are not cancerous, having a fibroadenoma may slightly increase your risk for developing breast cancer in the future. What are the causes? The cause of fibroadenoma is not known. What increases the risk? This condition is more likely to develop in:  Women who are 11-31 years old.  Women of African American descent. What are the signs or symptoms? You might have no symptoms. Some fibroadenomas are too small to feel. If you can feel it, it may feel like a lump in your breast that is:  Firm.  Round.  Smooth.  Slightly movable. A fibroadenoma usually occurs as a single lump, but sometimes there may be more than one lump. They can occur in one breast or in both breasts. Fibroadenomas vary in size. They usually do not cause pain unless they grow to a large size. How is this diagnosed? This condition may be diagnosed based on:  Your symptoms and medical history.  A physical exam. You may notice a lump during a breast self-exam, or your health care provider may notice it during a routine breast exam or breast X-ray (mammogram).  An ultrasound to check for fluid inside the lump (cystic tumor). If there is fluid, some fluid may be removed with a needle and examined under a microscope.  A mammogram to examine a lump that does not contain fluid  (solid). Depending on mammogram results, you may need to have a procedure to remove a tissue sample from the lump using a needle (breast biopsy). The tissue will be examined under a microscope. How is this treated? Treatment for this condition may include:  Having clinical breast exams regularly to check for changes in your fibroadenoma.  Having the fibroadenoma removed. A fibroadenoma may be removed if it is: ? Large. ? Continuing to grow. ? Causing symptoms, such as pain or a change in the appearance of your breast. ? A juvenile fibroadenoma. These tend to grow large over time. Follow these instructions at home:   Do breast self-exams at home as told by your health care provider. Monitor your fibroadenoma, the skin of your breasts, and your nipples for any changes.  Do not use any products that contain nicotine or tobacco, such as cigarettes and e-cigarettes. These can further increase your cancer risk. If you need help quitting, ask your health care provider.  Keep all follow-up visits as told by your health care provider. This is important. You will need breast exams on a regular basis. Contact a health care provider if:  Your fibroadenoma: ? Gets larger. ? Feels different. ? Becomes painful.  You find a new breast lump.  You have any changes in your breast skin, such as: ? Dimpling. ? Bruising. ? Thickening. ? Redness.  You have any changes in your nipple, such as: ? Fluid leaking from a nipple. ? Redness. Summary  Fibroadenoma is a breast tumor that is not  cancerous (is benign) and is made up of breast tissue and the tissue that holds breast tissue together (connective tissue).  Although fibroadenomas are not cancerous, having a fibroadenoma may slightly increase your risk for developing breast cancer in the future.  A fibroadenoma may feel like a lump in your breast. It is usually firm, round, smooth, and slightly movable. Some fibroadenomas are too small to be  felt.  Do breast self-exams at home as told by your health care provider. Monitor your fibroadenoma, the skin of your breasts, and your nipples for any changes. This information is not intended to replace advice given to you by your health care provider. Make sure you discuss any questions you have with your health care provider. Document Revised: 03/16/2017 Document Reviewed: 03/06/2017 Elsevier Patient Education  2020 [employer]   1) The drugs that you were given will stay in your system until tomorrow so for the next 24 hours you should not:  A) Drive an automobile B) Make any legal decisions C) Drink any alcoholic beverage   2) You may resume regular meals tomorrow.  Today it is better to start with liquids and gradually work up to solid foods.  You may eat anything you prefer, but it is better to start with liquids, then soup and crackers, and gradually work up to solid foods.   3) Please notify your doctor immediately if you have any unusual bleeding, trouble breathing, redness and pain at the surgery site, drainage, fever, or pain not relieved by medication.    4) Additional Instructions:        Please contact your physician with any problems or Same Day Surgery at [phone], Monday through Friday 6 am to 4 pm, or [place] [facility] number at [phone].

## 2020-01-16 NOTE — Op Note (Signed)
Operative Note  Preoperative Diagnosis: Growing right breast mass  Postoperative Diagnosis: Same, clinically consistent with a fibroadenoma  Operation: Right breast excisional biopsy  Surgeon: Fredirick Maudlin, MD  Assistant: None  Anesthesia: General endotracheal  Findings: There was a well-circumscribed, firm, rounded mass just to the right and posterior to the areola.  I was able to preserve the lactiferous ducts without significant disruption.  Indications: This is a 29 year old woman who noticed a right breast mass.  When she became pregnant, it seems to grow.  It was biopsied and was thought to represent a fibroepithelial lesion with myxoid stroma and lactational change.  He continued to grow however, and due to concern for sampling error and possible missed malignancy, she was referred for surgical excision.  The risks of the operation were discussed with her and she agreed to proceed.  Procedure In Detail: The patient was identified in the preoperative holding area.  Fetal heart tones were checked and were within acceptable range.  She was then brought to the operating room and placed supine on the OR table.  Her body was bumped to avoid pressure from her gravid uterus on her venous return.  She was sterilely prepped and draped in standard fashion.  A timeout was performed confirming the patient's identity, the procedure being performed, her allergies, all necessary equipment was available, and that maintenance anesthesia was adequate.  Perioperative antibiotics were administered.  A periareolar incision was marked.  The skin and subcutaneous tissues were infiltrated with a one-to-one mixture of 0.25% bupivacaine and 1% lidocaine with epinephrine.  The skin was then incised sharply.  Dissection continued with a combination of blunt dissection and electrocautery.  The mass was easily palpable and identified.  It was carefully dissected away from the surrounding tissues.  The lactiferous  ducts were identified and preserved.  The mass was circumferentially dissected out and excised completely.  It was marked with silk suture as follows: Short stitch at the superior margin, long stitch at the lateral margin, double stitch at the deep margin.  It was handed off as a specimen.  The wound was irrigated and hemostasis was achieved with use of electrocautery.  The wound was then closed in 2 layers with a deep dermal layer of interrupted 3-0 Vicryl and a skin layer of running subcuticular 4-0 Monocryl.  Care was taken to avoid any entrapment or injury to the lactiferous ducts as this was performed.  The skin was cleaned.  Dermabond and Steri-Strips were applied.  Gauze fluffs and a breast binder were used to complete the dressing.  The patient was then awakened, extubated, and taken to the postanesthesia care unit in good condition.  Fetal heart tones are pending.  EBL: 5 cc  IVF: See anesthesia record  Specimen(s): Right breast mass  Complications: none immediately apparent.   Counts: all needles, instruments, and sponges were counted and reported to be correct in number at the end of the case.   I was present for and participated in the entire operation.  Fredirick Maudlin 9:11 AM

## 2020-01-16 NOTE — H&P (Signed)
Chief Complaint  Patient presents with  . New Patient (Initial Visit)    right breast lump    HPI Crystal Randall is a 29 y.o. female.  She has been referred for further evaluation of a right breast lump.  She reports that she first noticed a small mass in her right breast in February 2020 while she was nursing.  She had an ultrasound and subsequent biopsy which was consistent with a benign process.  She did not think much of it and no follow-up was advised at that time.  She is now again pregnant and is noticing growth and increased discomfort.  She reports menarche at about 29 years of age.  She has had 3 pregnancies, the first at age 76.  She has had 1 elective abortion and as previously stated, she is currently about [redacted] weeks pregnant.  She did breast-feed her daughter.  She has previously been on oral contraceptives and reports that her maternal grandmother had breast cancer in her 22s.  She states that she does perform monthly breast exams and is able to palpate the lump.  She denies any skin changes or nipple discharge.  She does report that the breast is more tender than would be expected for just being pregnant.  I initially saw her in August and planned for surgery at that time, however she had transportation issues and her operation has been delayed.       Past Medical History:  Diagnosis Date  . Asthma   . Supervision of normal first pregnancy, antepartum 10/26/2017   Clinic     [facility]             Prenatal Labs Dating      LMP =9wk [place]           Blood type: B/Positive/-- (08/09 1604)  Genetic Screen   NIPS: normal XX          Antibody:Negative (08/09 1604) Anatomic [place]          complete           Rubella: <0.90 (08/09 1604) Varicella: Immune GTT          Third trimester: 84         RPR: Non Reactive (08/09 1604)  Rhogam       not needed       HBsAg: Negative (08/09 1604)  TDaP vaccine       Declines                    Flu Shot:12/28/17             HIV: Non Re         Past  Surgical History:  Procedure Laterality Date  . denies    . NO PAST SURGERIES           Family History  Problem Relation Age of Onset  . Hypertension Mother   . Cancer Maternal Grandmother     Social History Social History        Tobacco Use  . Smoking status: Current Every Day Smoker    Packs/day: 0.25    Years: 2.00    Pack years: 0.50    Types: Cigarettes  . Smokeless tobacco: Never Used  Vaping Use  . Vaping Use: Never used  Substance Use Topics  . Alcohol use: Yes    Comment: occasionally  . Drug use: Not Currently    Types: Barbituates    No Known Allergies  Current Outpatient Medications  Medication Sig Dispense Refill  . ondansetron (ZOFRAN ODT) 4 MG disintegrating tablet Take 1 tablet (4 mg total) by mouth every 6 (six) hours as needed for nausea. 20 tablet 0  . Prenatal Vit-Fe Fumarate-FA (MULTIVITAMIN-PRENATAL) 27-0.8 MG TABS tablet Take 1 tablet by mouth in the morning. 30 tablet 2   No current facility-administered medications for this visit.    Review of Systems Review of Systems  Constitutional: Positive for fatigue.  Gastrointestinal: Positive for nausea.       Morning sickness    Today's Vitals   01/16/20 0620  BP: 110/65  Pulse: 85  Resp: 16  Temp: 98.2 F (36.8 C)  TempSrc: Oral  SpO2: 99%  Weight: 70.8 kg  Height: 5\' 3"  (1.6 m)  PainSc: 0-No pain   Body mass index is 27.63 kg/m.  Physical Exam Physical Exam Vitals reviewed. Exam conducted with a chaperone present.  Constitutional:      General: She is not in acute distress.    Appearance: Normal appearance. She is normal weight.  HENT:     Head: Atraumatic.     Nose:     Comments: Covered with a mask    Mouth/Throat:     Comments: Covered with a mask Neck:     Comments: No palpable cervical or supraclavicular lymphadenopathy.  The trachea is midline.  No dominant thyroid masses or thyromegaly are appreciated.  The gland was freely  with deglutition. Cardiovascular:  RRR Pulmonary:     Effort: Pulmonary effort is normal.     Breath sounds: Normal breath sounds.  Chest:     Breasts:        Right: Mass present.        Left: Normal.       Comments: There is a roughly 5 cm firm rubbery mass in the right breast.  When the patient is seated, it feels like it is directly retroareolar; when she is supine, it is just to the right of the nipple areolar complex.  It is mobile and is not fixed to the overlying skin or underlying tissues.   Abdominal:     General: Bowel sounds are normal.     Palpations: Abdomen is soft.  Genitourinary:    Comments: Deferred Musculoskeletal:        General: No swelling or tenderness.  Lymphadenopathy:     Upper Body:     Right upper body: No supraclavicular, axillary or pectoral adenopathy.     Left upper body: No supraclavicular, axillary or pectoral adenopathy.  Skin:    General: Skin is warm and dry.  Neurological:     General: No focal deficit present.     Mental Status: She is alert and oriented to person, place, and time.  Psychiatric:        Mood and Affect: Mood normal.        Behavior: Behavior normal.     Data Reviewed  CLINICAL DATA: 29 year old pregnant female with a new palpable mass in the right breast.  EXAM: ULTRASOUND OF THE RIGHT BREAST  COMPARISON: Previous exam(s).  FINDINGS: On physical exam, I palpate a discrete mass in the right breast at 10:30 in the retroareolar region.  Targeted ultrasound is performed, showing a well-circumscribed hypoechoic mass with cystic spaces in the right breast at 10:30 in the retroareolar region measuring 2.2 x 1.5 x 2.2 cm. It has internal blood flow. Sonographic evaluation of the right axilla does not show any enlarged adenopathy.  IMPRESSION: Indeterminate  mass in the 10:30 region of the right breast. This may be a lactating adenoma but given that it is new palpable ultrasound-guided core biopsy is  recommended.  RECOMMENDATION: Ultrasound-guided core biopsy of the mass in the 10:30 region of the right breast is recommended.  I have discussed the findings and recommendations with the patient. Results were also provided in writing at the conclusion of the visit. If applicable, a reminder letter will be sent to the patient regarding the next appointment.  BI-RADS CATEGORY 4: Suspicious. SPECIMEN SUBMITTED:  A. Breast, right, retroareolar 10:30 axis   CLINICAL HISTORY:  29 year old pregnant patient with a well-circumscribed hypoechoic mass  with cystic spaces in the right breast at 10:30 in the retroareolar  region measuring 2.2 cm   PRE-OPERATIVE DIAGNOSIS:  Lactating adenoma vs fibroadenoma vs CA (less likely)   POST-OPERATIVE DIAGNOSIS:  Same as above      DIAGNOSIS:  A. 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   Comment:  This may represent sampling of a fibroadenoma with superimposed  lactational and possible ischemic change. Correlation with radiographic  findings and clinical impression is required.  CLINICAL DATA: 29 year old female presenting for evaluation of a palpable lump in the retroareolar right breast. The patient noticed this previously when she was nursing in February of 2020. This was biopsied with pathology indicating fibroepithelial lesion with myxoid stroma and lactational change. She is now currently pregnant and has noticed that the mass has gotten larger, and is now causing some pain.  EXAM: ULTRASOUND OF THE RIGHT BREAST  COMPARISON: Previous exam(s).  FINDINGS: Ultrasound targeted to the palpable site in the retroareolar right breast at 10:30 demonstrates a large slightly hypoechoic mixed cystic and solid vascular mass measuring 4.3 x 2.6 x 3.2 cm, previously 2.2 x 1.5 x 2.2 cm. Ultrasound of the right axilla demonstrates  multiple normal-appearing lymph nodes.  IMPRESSION: 1. Significant increase in size of the previously biopsied mass in the retroareolar right breast.  2. No evidence of right axillary lymphadenopathy.  RECOMMENDATION: Surgical consultation is recommended for possible excision, which can be facilitated by her primary care physician. A repeat biopsy of the mass may be performed if determined to be clinically useful.  I have discussed the findings and recommendations with the patient. If applicable, a reminder letter will be sent to the patient regarding the next appointment.  BI-RADS CATEGORY 4: Suspicious.  I personally reviewed the imaging and concur with the radiology interpretations copied above.  Assessment This is a 29 year old woman with a growing mass in her right breast.  She is now 5 months pregnant.  Although prior biopsy was benign, there may be potential for sampling error, given the significant growth over the past year.  Plan I have recommended that she undergo surgical excision.  As the mass is likely benign, I do not plan to perform sentinel lymph node biopsy.  I discussed with her the risks of surgery.  These include, but are not limited to, bleeding, infection, unanticipated findings that would necessitate further surgery or treatment, potential breast size discrepancy or undesirable cosmetic outcome, seroma, hematoma, wound healing complications, potential interference with ability to breast-feed.  She understands and accepts these risks.

## 2020-01-16 NOTE — Anesthesia Procedure Notes (Signed)
Procedure Name: Intubation Date/Time: 01/16/2020 7:45 AM Performed by: Johnna Acosta, CRNA Pre-anesthesia Checklist: Patient identified, Emergency Drugs available, Suction available, Patient being monitored and Timeout performed Patient Re-evaluated:Patient Re-evaluated prior to induction Oxygen Delivery Method: Circle system utilized Preoxygenation: Pre-oxygenation with 100% oxygen Induction Type: IV induction, Rapid sequence and Cricoid Pressure applied Laryngoscope Size: McGraph and 3 Grade View: Grade I Tube type: Oral Tube size: 7.0 mm Number of attempts: 1 Airway Equipment and Method: Stylet and Video-laryngoscopy Placement Confirmation: ETT inserted through vocal cords under direct vision,  positive ETCO2 and breath sounds checked- equal and bilateral Secured at: 20 cm Tube secured with: Tape Dental Injury: Teeth and Oropharynx as per pre-operative assessment

## 2020-01-16 NOTE — Transfer of Care (Signed)
Immediate Anesthesia Transfer of Care Note  Patient: Crystal Randall  Procedure(s) Performed: CYST EXCISION BREAST, breast mass (Right Breast)  Patient Location: PACU  Anesthesia Type:General  Level of Consciousness: drowsy and patient cooperative  Airway & Oxygen Therapy: Patient Spontanous Breathing and Patient connected to face mask oxygen  Post-op Assessment: Report given to RN and Post -op Vital signs reviewed and stable  Post vital signs: Reviewed and stable  Last Vitals:  Vitals Value Taken Time  BP 98/46 01/16/20 0857  Temp    Pulse 97 01/16/20 0900  Resp 23 01/16/20 0900  SpO2 100 % 01/16/20 0900  Vitals shown include unvalidated device data.  Last Pain:  Vitals:   01/16/20 0620  TempSrc: Oral  PainSc: 0-No pain         Complications: No complications documented.

## 2020-01-16 NOTE — Anesthesia Postprocedure Evaluation (Signed)
Anesthesia Post Note  Patient: Crystal Randall  Procedure(s) Performed: CYST EXCISION BREAST, breast mass (Right Breast)  Patient location during evaluation: PACU Anesthesia Type: General Level of consciousness: awake and alert Pain management: pain level controlled Vital Signs Assessment: post-procedure vital signs reviewed and stable Respiratory status: spontaneous breathing and respiratory function stable Cardiovascular status: stable Anesthetic complications: no   No complications documented.   Last Vitals:  Vitals:   01/16/20 0620 01/16/20 0857  BP: 110/65 (!) 98/46  Pulse: 85 95  Resp: 16 17  Temp: 36.8 C (P) 36.6 C  SpO2: 99% 100%    Last Pain:  Vitals:   01/16/20 0620  TempSrc: Oral  PainSc: 0-No pain                 Dulcey Riederer K

## 2020-01-19 DIAGNOSIS — Z419 Encounter for procedure for purposes other than remedying health state, unspecified: Secondary | ICD-10-CM | POA: Diagnosis not present

## 2020-01-19 LAB — SURGICAL PATHOLOGY

## 2020-01-21 ENCOUNTER — Encounter: Payer: Medicaid Other | Admitting: Obstetrics & Gynecology

## 2020-01-29 ENCOUNTER — Encounter: Payer: Medicaid Other | Admitting: General Surgery

## 2020-02-04 ENCOUNTER — Other Ambulatory Visit: Payer: Self-pay

## 2020-02-04 ENCOUNTER — Encounter: Payer: Self-pay | Admitting: Obstetrics and Gynecology

## 2020-02-04 ENCOUNTER — Ambulatory Visit (INDEPENDENT_AMBULATORY_CARE_PROVIDER_SITE_OTHER): Payer: Medicaid Other | Admitting: Obstetrics and Gynecology

## 2020-02-04 VITALS — BP 120/70 | Wt 161.0 lb

## 2020-02-04 DIAGNOSIS — O09899 Supervision of other high risk pregnancies, unspecified trimester: Secondary | ICD-10-CM

## 2020-02-04 DIAGNOSIS — Z131 Encounter for screening for diabetes mellitus: Secondary | ICD-10-CM

## 2020-02-04 DIAGNOSIS — Z3482 Encounter for supervision of other normal pregnancy, second trimester: Secondary | ICD-10-CM

## 2020-02-04 DIAGNOSIS — Z113 Encounter for screening for infections with a predominantly sexual mode of transmission: Secondary | ICD-10-CM

## 2020-02-04 NOTE — Progress Notes (Signed)
Routine Prenatal Care Visit  Subjective  Crystal Randall is a 29 y.o. G4P1011 at [redacted]w[redacted]d being seen today for ongoing prenatal care.  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  ----------------------------------------------------------------------------------- Patient reports no complaints.   Contractions: Not present. Vag. Bleeding: None.  Movement: Present. Leaking Fluid denies.  ----------------------------------------------------------------------------------- The following portions of the patient's history were reviewed and updated as appropriate: allergies, current medications, past family history, past medical history, past social history, past surgical history and problem list. Problem list updated.  Objective  Blood pressure 120/70, weight 161 lb (73 kg), last menstrual period 08/09/2019, unknown if currently breastfeeding. Pregravid weight 160 lb (72.6 kg) Total Weight Gain 1 lb (0.454 kg) Urinalysis: Urine Protein    Urine Glucose    Fetal Status: Fetal Heart Rate (bpm): 140 Fundal Height: 26 cm Movement: Present     General:  Alert, oriented and cooperative. Patient is in no acute distress.  Skin: Skin is warm and dry. No rash noted.   Cardiovascular: Normal heart rate noted  Respiratory: Normal respiratory effort, no problems with respiration noted  Abdomen: Soft, gravid, appropriate for gestational age. Pain/Pressure: Absent     Pelvic:  Cervical exam deferred        Extremities: Normal range of motion.  Edema: None  Mental Status: Normal mood and affect. Normal behavior. Normal judgment and thought content.   Assessment   29 y.o.  G4P1011 at [redacted]w[redacted]d by  05/15/2020, by Last Menstrual Period presenting for routine prenatal visit  Plan   pregnancy Problems (from 10/22/19 to present)    Problem Noted Resolved   Encounter for supervision of normal pregnancy in first trimester 10/24/2019 by Rod Can, CNM No   Overview Signed 10/24/2019  2:51 PM by Rod Can, [facility] Prenatal Labs  Dating  Blood type: B/Positive/-- (08/04 1147)   Genetic Screen 1 Screen:    AFP:     Quad:     NIPS: Antibody:Negative (08/04 1147)  Anatomic [place]  Rubella: <0.90 (08/04 1147)  Varicella: @VZVIGG @  GTT Early: NA               Third trimester:  RPR: Non Reactive (08/04 1147)   Rhogam NA HBsAg: Negative (08/04 1147)   Vaccines TDAP:                       Flu Shot: Covid: HIV: Non Reactive (08/04 1147)   Baby Food Breast                               GBS:   GC/CT:  Contraception  Pap: 2021 negative  CBB     CS/VBAC NA   Support Person Scott              Preterm labor symptoms and general obstetric precautions including but not limited to vaginal bleeding, contractions, leaking of fluid and fetal movement were reviewed in detail with the patient. Please refer to After Visit Summary for other counseling recommendations.   Return in about 2 weeks (around 02/18/2020) for 28 week labs and routine prenatal.   Prentice Docker, MD, [facility],  [facility] 02/04/2020 4:56 PM

## 2020-02-05 ENCOUNTER — Encounter: Payer: Self-pay | Admitting: General Surgery

## 2020-02-05 ENCOUNTER — Ambulatory Visit (INDEPENDENT_AMBULATORY_CARE_PROVIDER_SITE_OTHER): Payer: Medicaid Other | Admitting: General Surgery

## 2020-02-05 VITALS — BP 127/76 | HR 116 | Temp 98.8°F | Ht 63.0 in | Wt 160.0 lb

## 2020-02-05 DIAGNOSIS — Z9889 Other specified postprocedural states: Secondary | ICD-10-CM

## 2020-02-05 NOTE — Progress Notes (Signed)
Crystal Randall is here today for a postoperative visit.  She is a 29 year old woman who had a right breast mass that was rapidly growing with her pregnancy.  It was excised on January 16, 2020.  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  She states that she has done well since her procedure.  She notices some hypersensitivity to cold along the incision and in the nipple of her right breast.  She denies any fevers or chills.  No nausea or vomiting.  She says the Steri-Strips are still in place.  Pathology:   RIGHT BREAST MASS; EXCISION:  - FIBROADENOMA.  - NEGATIVE FOR ATYPIA AND MALIGNANCY.  Today's Vitals   02/05/20 1332  BP: 127/76  Pulse: (!) 116  Temp: 98.8 F (37.1 C)  TempSrc: Oral  SpO2: 97%  Weight: 160 lb (72.6 kg)  Height: 5\' 3"  (1.6 m)  PainSc: 4   PainLoc: Breast   Body mass index is 28.34 kg/m. Focused examination demonstrates that the Steri-Strips are in place.  These were removed to reveal a well approximated circumareolar incision.  There is no erythema, induration, or drainage present.  Impression and plan: This is a 29 year old woman who had a rapidly growing right breast mass.  She is currently pregnant.  She reports that the pregnancy is progressing well.  The breast mass was excised and is benign.  I do not anticipate any issues with breast-feeding.  The hypersensitivity should resolve with time.  I will see her on an as-needed basis.

## 2020-02-05 NOTE — Patient Instructions (Addendum)
Dr Celine Ahr discussed with patient that some of the soreness she is experiencing may be related to pregnancy. Dr Celine Ahr discussed with patient that she should not have any problems with breastfeeding. Patient may resume with showering or submerge in a bath. Patient may massage Vitamin E oil to the breast to help with scarring and healing process. Follow-up with our office as needed. Please call and ask to speak with a nurse if you develop questions or concerns.  Breast Self-Awareness Breast self-awareness is knowing how your breasts look and feel. Doing breast self-awareness is important. It allows you to catch a breast problem early while it is still small and can be treated. All women should do breast self-awareness, including women who have had breast implants. Tell your doctor if you notice a change in your breasts. What you need:  A mirror.  A well-lit room. How to do a breast self-exam A breast self-exam is one way to learn what is normal for your breasts and to check for changes. To do a breast self-exam: Look for changes  1. Take off all the clothes above your waist. 2. Stand in front of a mirror in a room with good lighting. 3. Put your hands on your hips. 4. Push your hands down. 5. Look at your breasts and nipples in the mirror to see if one breast or nipple looks different from the other. Check to see if: ? The shape of one breast is different. ? The size of one breast is different. ? There are wrinkles, dips, and bumps in one breast and not the other. 6. Look at each breast for changes in the skin, such as: ? Redness. ? Scaly areas. 7. Look for changes in your nipples, such as: ? Liquid around the nipples. ? Bleeding. ? Dimpling. ? Redness. ? A change in where the nipples are. Feel for changes  1. Lie on your back on the floor. 2. Feel each breast. To do this, follow these steps: ? Pick a breast to feel. ? Put the arm closest to that breast above your head. ? Use  your other arm to feel the nipple area of your breast. Feel the area with the pads of your three middle fingers by making small circles with your fingers. For the first circle, press lightly. For the second circle, press harder. For the third circle, press even harder. ? Keep making circles with your fingers at the different pressures as you move down your breast. Stop when you feel your ribs. ? Move your fingers a little toward the center of your body. ? Start making circles with your fingers again, this time going up until you reach your collarbone. ? Keep making up-and-down circles until you reach your armpit. Remember to keep using the three pressures. ? Feel the other breast in the same way. 3. Sit or stand in the tub or shower. 4. With soapy water on your skin, feel each breast the same way you did in step 2 when you were lying on the floor. Write down what you find Writing down what you find can help you remember what to tell your doctor. Write down:  What is normal for each breast.  Any changes you find in each breast, including: ? The kind of changes you find. ? Whether you have pain. ? Size and location of any lumps.  When you last had your menstrual period. General tips  Check your breasts every month.  If you are breastfeeding, the best time  to check your breasts is after you feed your baby or after you use a breast pump.  If you get menstrual periods, the best time to check your breasts is 5-7 days after your menstrual period is over.  With time, you will become comfortable with the self-exam, and you will begin to know if there are changes in your breasts. Contact a doctor if you:  See a change in the shape or size of your breasts or nipples.  See a change in the skin of your breast or nipples, such as red or scaly skin.  Have fluid coming from your nipples that is not normal.  Find a lump or thick area that was not there before.  Have pain in your breasts.  Have  any concerns about your breast health. Summary  Breast self-awareness includes looking for changes in your breasts, as well as feeling for changes within your breasts.  Breast self-awareness should be done in front of a mirror in a well-lit room.  You should check your breasts every month. If you get menstrual periods, the best time to check your breasts is 5-7 days after your menstrual period is over.  Let your doctor know of any changes you see in your breasts, including changes in size, changes on the skin, pain or tenderness, or fluid from your nipples that is not normal. This information is not intended to replace advice given to you by your health care provider. Make sure you discuss any questions you have with your health care provider. Document Revised: 10/23/2017 Document Reviewed: 10/23/2017 Elsevier Patient Education  [place].

## 2020-02-16 ENCOUNTER — Other Ambulatory Visit: Payer: Self-pay

## 2020-02-16 ENCOUNTER — Other Ambulatory Visit: Payer: Medicaid Other

## 2020-02-16 ENCOUNTER — Encounter: Payer: Self-pay | Admitting: Obstetrics and Gynecology

## 2020-02-16 ENCOUNTER — Ambulatory Visit (INDEPENDENT_AMBULATORY_CARE_PROVIDER_SITE_OTHER): Payer: Medicaid Other | Admitting: Obstetrics and Gynecology

## 2020-02-16 VITALS — BP 113/70 | Ht 63.0 in | Wt 160.6 lb

## 2020-02-16 DIAGNOSIS — Z3A27 27 weeks gestation of pregnancy: Secondary | ICD-10-CM

## 2020-02-16 DIAGNOSIS — Z3482 Encounter for supervision of other normal pregnancy, second trimester: Secondary | ICD-10-CM

## 2020-02-16 DIAGNOSIS — Z113 Encounter for screening for infections with a predominantly sexual mode of transmission: Secondary | ICD-10-CM | POA: Diagnosis not present

## 2020-02-16 DIAGNOSIS — Z131 Encounter for screening for diabetes mellitus: Secondary | ICD-10-CM

## 2020-02-16 DIAGNOSIS — Z3483 Encounter for supervision of other normal pregnancy, third trimester: Secondary | ICD-10-CM

## 2020-02-16 NOTE — Progress Notes (Signed)
Routine Prenatal Care Visit  Subjective  Crystal Randall is a 29 y.o. G4P1011 at [redacted]w[redacted]d being seen today for ongoing prenatal care.  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  ----------------------------------------------------------------------------------- Patient reports she is concerned for right inquinal hernia.   Contractions: Not present. Vag. Bleeding: None.  Movement: Present. Denies leaking of fluid.  ----------------------------------------------------------------------------------- The following portions of the patient's history were reviewed and updated as appropriate: allergies, current medications, past family history, past medical history, past social history, past surgical history and problem list. Problem list updated.   Objective  Blood pressure 113/70, height 5\' 3"  (1.6 m), weight 160 lb 9.6 oz (72.8 kg), last menstrual period 08/09/2019, unknown if currently breastfeeding. Pregravid weight 160 lb (72.6 kg) Total Weight Gain 9.6 oz (0.272 kg) Urinalysis:      Fetal Status: Fetal Heart Rate (bpm): 150 Fundal Height: 29 cm Movement: Present     General:  Alert, oriented and cooperative. Patient is in no acute distress.  Skin: Skin is warm and dry. No rash noted.   Cardiovascular: Normal heart rate noted  Respiratory: Normal respiratory effort, no problems with respiration noted  Abdomen: Soft, gravid, appropriate for gestational age. Pain/Pressure: Absent     Pelvic:  Cervical exam deferred        Extremities: Normal range of motion.  Edema: None  Mental Status: Normal mood and affect. Normal behavior. Normal judgment and thought  content.     Assessment   29 y.o. G4P1011 at [redacted]w[redacted]d by  05/15/2020, by Last Menstrual Period presenting for routine prenatal visit  Plan   pregnancy Problems (from 10/22/19 to present)    Problem Noted Resolved   Encounter for supervision of normal pregnancy in first trimester 10/24/2019 by Rod Can, CNM No   Overview Signed 10/24/2019  2:51 PM by Rod Can, [place] Prenatal Labs  Dating  lmp= 11 wk [place] Blood type: B/Positive/-- (08/04 1147)   Genetic Screen   NIPS: normal xx Antibody:Negative (08/04 1147)  Anatomic [place] complete Rubella: <0.90 (08/04 1147)  Varicella: @VZVIGG @  GTT Early: NA               Third trimester:  RPR: Non Reactive (08/04 1147)   Rhogam NA HBsAg: Negative (08/04 1147)   Vaccines TDAP:                       Flu Shot: Covid: HIV: Non Reactive (08/04 1147)   Baby Food Breast                               GBS:   GC/CT:  Contraception  Pap: 2021 negative  CBB     CS/VBAC NA   Support Person Scott             Discussed options for hernia evaluation during pregnancy or after. Patient prefers to wait.  Discussed concerning signs for incarceration such as nausea, vomiting, and pain.   Gestational age appropriate obstetric precautions including but not limited to vaginal bleeding, contractions, leaking of fluid and fetal movement were reviewed in detail with the patient.    Return  in about 2 weeks (around 03/01/2020) for ROB in person.  Homero Fellers MD [facility], [facility] 02/16/2020, 4:43 PM

## 2020-02-17 LAB — 28 WEEK RH+PANEL
Basophils Absolute: 0 10*3/uL (ref 0.0–0.2)
Basos: 0 %
EOS (ABSOLUTE): 0.3 10*3/uL (ref 0.0–0.4)
Eos: 3 %
Gestational Diabetes Screen: 102 mg/dL (ref 65–139)
HIV Screen 4th Generation wRfx: NONREACTIVE
Hematocrit: 26.9 % — ABNORMAL LOW (ref 34.0–46.6)
Hemoglobin: 8.4 g/dL — ABNORMAL LOW (ref 11.1–15.9)
Immature Grans (Abs): 0.1 10*3/uL (ref 0.0–0.1)
Immature Granulocytes: 1 %
Lymphocytes Absolute: 2 10*3/uL (ref 0.7–3.1)
Lymphs: 19 %
MCH: 21.1 pg — ABNORMAL LOW (ref 26.6–33.0)
MCHC: 31.2 g/dL — ABNORMAL LOW (ref 31.5–35.7)
MCV: 67 fL — ABNORMAL LOW (ref 79–97)
Monocytes Absolute: 0.9 10*3/uL (ref 0.1–0.9)
Monocytes: 8 %
Neutrophils Absolute: 7.1 10*3/uL — ABNORMAL HIGH (ref 1.4–7.0)
Neutrophils: 69 %
Platelets: 310 10*3/uL (ref 150–450)
RBC: 3.99 x10E6/uL (ref 3.77–5.28)
RDW: 15.5 % — ABNORMAL HIGH (ref 11.7–15.4)
RPR Ser Ql: NONREACTIVE
WBC: 10.4 10*3/uL (ref 3.4–10.8)

## 2020-02-18 DIAGNOSIS — Z419 Encounter for procedure for purposes other than remedying health state, unspecified: Secondary | ICD-10-CM | POA: Diagnosis not present

## 2020-03-01 ENCOUNTER — Other Ambulatory Visit: Payer: Self-pay

## 2020-03-01 ENCOUNTER — Ambulatory Visit (INDEPENDENT_AMBULATORY_CARE_PROVIDER_SITE_OTHER): Payer: Medicaid Other | Admitting: Obstetrics and Gynecology

## 2020-03-01 ENCOUNTER — Encounter: Payer: Self-pay | Admitting: Obstetrics and Gynecology

## 2020-03-01 VITALS — BP 118/74 | Wt 164.0 lb

## 2020-03-01 DIAGNOSIS — O99013 Anemia complicating pregnancy, third trimester: Secondary | ICD-10-CM

## 2020-03-01 DIAGNOSIS — Z3A29 29 weeks gestation of pregnancy: Secondary | ICD-10-CM

## 2020-03-01 DIAGNOSIS — Z3483 Encounter for supervision of other normal pregnancy, third trimester: Secondary | ICD-10-CM

## 2020-03-01 DIAGNOSIS — O09899 Supervision of other high risk pregnancies, unspecified trimester: Secondary | ICD-10-CM

## 2020-03-01 DIAGNOSIS — O99019 Anemia complicating pregnancy, unspecified trimester: Secondary | ICD-10-CM | POA: Insufficient documentation

## 2020-03-01 NOTE — Progress Notes (Signed)
Routine Prenatal Care Visit  Subjective  Crystal Randall is a 29 y.o. G4P1011 at [redacted]w[redacted]d being seen today for ongoing prenatal care.  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  ----------------------------------------------------------------------------------- Patient reports no complaints.   Contractions: Not present. Vag. Bleeding: None.  Movement: Present. Leaking Fluid denies.  ----------------------------------------------------------------------------------- The following portions of the patient's history were reviewed and updated as appropriate: allergies, current medications, past family history, past medical history, past social history, past surgical history and problem list. Problem list updated.  Objective  Blood pressure 118/74, weight 164 lb (74.4 kg), last menstrual period 08/09/2019, unknown if currently breastfeeding. Pregravid weight 160 lb (72.6 kg) Total Weight Gain 4 lb (1.814 kg) Urinalysis: Urine Protein    Urine Glucose    Fetal Status: Fetal Heart Rate (bpm): 140 Fundal Height: 30 cm Movement: Present     General:  Alert, oriented and cooperative. Patient is in no acute distress.  Skin: Skin is warm and dry. No rash noted.   Cardiovascular: Normal heart rate noted  Respiratory: Normal respiratory effort, no problems with respiration noted  Abdomen: Soft, gravid, appropriate for gestational age. Pain/Pressure: Absent     Pelvic:  Cervical exam deferred        Extremities: Normal range of motion.  Edema: None  Mental Status: Normal mood and affect. Normal behavior. Normal judgment and thought content.    Assessment   29 y.o. G4P1011 at [redacted]w[redacted]d by  05/15/2020, by Last Menstrual Period presenting for routine prenatal visit  Plan   pregnancy Problems (from 10/22/19 to present)    Problem Noted Resolved   Anemia of pregnancy 03/01/2020 by Will Bonnet, MD No   Overview Signed 03/01/2020  3:19 PM by Will Bonnet, MD    [x]  heme referral      Encounter for supervision of normal pregnancy in first trimester 10/24/2019 by Rod Can, CNM No   Overview Addendum 02/16/2020  4:44 PM by Homero Fellers, MD    Clinic [facility] Prenatal Labs  Dating lmp = 11 wk [place] Blood type: B/Positive/-- (08/04 1147)   Genetic Screen   NIPS:normal xx Antibody:Negative (08/04 1147)  Anatomic [place] complete Rubella: <0.90 (08/04 1147)  Varicella: @VZVIGG @  GTT Early: NA               Third trimester:  RPR: Non Reactive (08/04 1147)   Rhogam NA HBsAg: Negative (08/04 1147)   Vaccines TDAP:                       Flu Shot: Covid: HIV: Non Reactive (08/04 1147)   Baby Food Breast                               GBS:   GC/CT:  Contraception  Pap: 2021 negative  CBB     CS/VBAC NA   Support Person Nicki Reaper          Previous Version       Preterm labor symptoms and general obstetric precautions including but not limited to vaginal bleeding, contractions, leaking of fluid and fetal movement were reviewed in detail with the patient.  Please refer to After Visit Summary for other counseling recommendations.   - discussed anemia that is currently moderate and likely due to iron deficiency. Referral to hematology made today.   Return in about 2 weeks (around 03/15/2020) for Routine Prenatal Appointment.   Prentice Docker, MD, [facility], [facility] 03/01/2020 3:20 PM

## 2020-03-14 NOTE — Progress Notes (Signed)
[facility]  [phone] [phone]  ID: Crystal Randall OB: Feb 07, 1991  MR#: [id number]  NGE#:[id number]  Patient Care Team: Patient, No Pcp Per as PCP - General (General Practice)  CHIEF COMPLAINT: Anemia in third trimester of pregnancy.  INTERVAL HISTORY: Patient is a 29 year old female in the third trimester of her second pregnancy who was noted to have a declining hemoglobin.  She has increased weakness and fatigue as well as some mild shortness of breath, but otherwise feels well.  She is gaining weight appropriately.  She has no neurologic complaints.  She denies any recent fevers or illnesses.  She has good appetite.  She has no chest pain, cough, or hemoptysis.  She denies any nausea, vomiting, constipation, or diarrhea.  She has no melena or hematochezia.  She has no urinary complaints.  Patient offers no further specific complaints today.  REVIEW OF SYSTEMS:   Review of Systems  Constitutional: Positive for malaise/fatigue. Negative for fever and weight loss.  Respiratory: Negative.  Negative for cough, hemoptysis and shortness of breath.   Cardiovascular: Negative.  Negative for chest pain and leg swelling.  Gastrointestinal: Negative.  Negative for abdominal pain, blood in stool and melena.  Genitourinary: Negative.  Negative for hematuria.  Musculoskeletal: Negative.  Negative for back pain.  Skin: Negative.  Negative for rash.  Neurological: Positive for weakness. Negative for dizziness, focal weakness and headaches.  Psychiatric/Behavioral: Negative.  The patient is not nervous/anxious.     As per HPI. Otherwise, a complete review of systems is negative.  PAST MEDICAL HISTORY: Past Medical History:  Diagnosis Date  . Anemia   . Asthma   . Supervision of normal first pregnancy, antepartum 10/26/2017   [facility]inic [facility] Prenatal Labs Dating LMP =9wk [place] Blood type: B/Positive/-- (08/09 1604)  Genetic Screen   NIPS: normal  XX Antibody:Negative (08/09 1604) Anatomic [place]  complete Rubella: <0.90 (08/09 1604) Varicella: Immune GTT  Third trimester: 84 RPR: Non Reactive (08/09 1604)  Rhogam  not needed HBsAg: Negative (08/09 1604)  TDaP vaccine    Declines                    Flu Shot:12/28/17 HIV: Non Re    PAST SURGICAL HISTORY: Past Surgical History:  Procedure Laterality Date  . BREAST CYST EXCISION Right 01/16/2020   Procedure: CYST EXCISION BREAST, breast mass;  Surgeon: Fredirick Maudlin, MD;  Location: [facility] ORS;  Service: General;  Laterality: Right;  . denies    . NO PAST SURGERIES      FAMILY HISTORY: Family History  Problem Relation Age of Onset  . Hypertension Mother   . Cancer Mother   . Cancer Maternal Grandmother     ADVANCED DIRECTIVES (Y/N):  N  HEALTH MAINTENANCE: Social History   Tobacco Use  . Smoking status: Current Every Day Smoker    Packs/day: 0.25    Years: 2.00    Pack years: 0.50    Types: Cigarettes  . Smokeless tobacco: Never Used  Vaping Use  . Vaping Use: Never used  Substance Use Topics  . Alcohol use: Not Currently  . Drug use: Not Currently    Types: Barbituates     Colonoscopy:  PAP:  Bone density:  Lipid panel:  No Known Allergies  Current Outpatient Medications  Medication Sig Dispense Refill  . Prenatal Vit-Fe Fumarate-FA (PRENATAL VITAMINS) 28-0.8 MG TABS Take 1 tablet by mouth daily.    Marland Kitchen HYDROcodone-acetaminophen (NORCO/VICODIN) 5-325 MG tablet Take 1 tablet  by mouth every 6 (six) hours as needed for moderate pain or severe pain. Avoid taking more than 4000 mg of Tylenol (cummulative) in a 24 hour period. (Patient not taking: No sig reported) 15 tablet 0  . ondansetron (ZOFRAN ODT) 4 MG disintegrating tablet Take 1 tablet (4 mg total) by mouth every 6 (six) hours as needed for nausea. (Patient not taking: No sig reported) 20 tablet 0   No current facility-administered medications for this visit.    OBJECTIVE: Vitals:   03/16/20 1505  BP:  118/70  Pulse: (!) 104  Temp: 98 F (36.7 C)  SpO2: 100%     Body mass index is 28.63 kg/m.    ECOG FS:1 - Symptomatic but completely ambulatory  General: Well-developed, well-nourished, no acute distress. Eyes: Pink conjunctiva, anicteric sclera. HEENT: Normocephalic, moist mucous membranes. Lungs: No audible wheezing or coughing. Heart: Regular rate and rhythm. Abdomen: Soft, nontender, no obvious distention. Musculoskeletal: No edema, cyanosis, or clubbing. Neuro: Alert, answering all questions appropriately. Cranial nerves grossly intact. Skin: No rashes or petechiae noted. Psych: Normal affect. Lymphatics: No cervical, calvicular, axillary or inguinal LAD.   LAB RESULTS:  Lab Results  Component Value Date   NA 138 09/15/2019   K 3.5 09/15/2019   CL 106 09/15/2019   CO2 24 09/15/2019   GLUCOSE 83 09/15/2019   BUN <5 (L) 09/15/2019   CREATININE 0.64 09/15/2019   CALCIUM 8.9 09/15/2019   PROT 7.5 09/15/2019   ALBUMIN 3.8 09/15/2019   AST 23 09/15/2019   ALT 26 09/15/2019   ALKPHOS 64 09/15/2019   BILITOT 0.6 09/15/2019   GFRNONAA >60 09/15/2019   GFRAA >60 09/15/2019    Lab Results  Component Value Date   WBC 9.5 03/16/2020   NEUTROABS 7.1 (H) 02/16/2020   HGB 7.6 (L) 03/16/2020   HCT 24.8 (L) 03/16/2020   MCV 66.1 (L) 03/16/2020   PLT 266 03/16/2020     STUDIES: No results found.  ASSESSMENT: Anemia in third trimester of pregnancy.  PLAN:    1. Anemia in third trimester of pregnancy: Patient's most recent hemoglobin has trended down to 7.6 and her MCV is also significantly decreased at 66.1.  Iron stores are pending at time of dictation.  Have also ordered B12, folate, and hemoglobin electrophoresis for completeness.  Patient will benefit from IV Feraheme and will return to clinic in 1 and 2 weeks for infusions.  She will then return to clinic in mid February approximately 1 week prior to her due date for further evaluation and consideration of  additional Feraheme if needed. 2.  Pregnancy: Patient is having a baby girl and her due date is May 15, 2020.    Patient expressed understanding and was in agreement with this plan. She also understands that She can call clinic at any time with any questions, concerns, or complaints.    Lloyd Huger, MD   03/16/2020 3:57 PM

## 2020-03-15 ENCOUNTER — Ambulatory Visit (INDEPENDENT_AMBULATORY_CARE_PROVIDER_SITE_OTHER): Payer: Medicaid Other | Admitting: Obstetrics

## 2020-03-15 ENCOUNTER — Other Ambulatory Visit: Payer: Self-pay

## 2020-03-15 VITALS — BP 100/50 | Wt 161.0 lb

## 2020-03-15 DIAGNOSIS — Z3483 Encounter for supervision of other normal pregnancy, third trimester: Secondary | ICD-10-CM

## 2020-03-15 DIAGNOSIS — Z23 Encounter for immunization: Secondary | ICD-10-CM | POA: Diagnosis not present

## 2020-03-15 DIAGNOSIS — Z3A31 31 weeks gestation of pregnancy: Secondary | ICD-10-CM

## 2020-03-15 LAB — POCT URINALYSIS DIPSTICK OB
Glucose, UA: NEGATIVE
POC,PROTEIN,UA: NEGATIVE

## 2020-03-15 NOTE — Progress Notes (Signed)
C/o constipation. rj

## 2020-03-15 NOTE — Progress Notes (Signed)
Routine Prenatal Care Visit  Subjective  Crystal Randall is a 29 y.o. G4P1011 at [redacted]w[redacted]d being seen today for ongoing prenatal care.  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  ----------------------------------------------------------------------------------- Crystal Randall reports no bleeding, no contractions, no cramping, no leaking and but she has had some "bumps on her right labia that she would like examnined.. she is getting her tdap today..    .  .   Crystal Randall Fluid denies.  ----------------------------------------------------------------------------------- The following portions of the Crystal Randall's history were reviewed and updated as appropriate: allergies, current medications, past family history, past medical history, past social history, past surgical history and problem list. Problem list updated.  Objective  Blood pressure (!) 100/50, weight 161 lb (73 kg), last menstrual period 08/09/2019, unknown if currently breastfeeding. Pregravid weight 160 lb (72.6 kg) Total Weight Gain 1 lb (0.454 kg) Urinalysis: Urine Protein    Urine Glucose    Fetal Status:           General:  Alert, oriented and cooperative. Crystal Randall is in no acute distress.  Skin: Skin is warm and dry. No rash noted.   Cardiovascular: Normal heart rate noted  Respiratory: Normal respiratory effort, no problems with respiration noted  Abdomen: Soft, gravid, appropriate for gestational age.       Pelvic:  Cervical exam deferred        Extremities: Normal range of motion.  Edema: None  Mental Status: Normal mood and affect. Normal behavior. Normal judgment and thought  content.   Exteranl genitalia examined: right labia has tow noticeable varicosities that run "north to [employer]". No lesions ore rashes noted. No vaginal discharge. Assessment   29 y.o. G4P1011 at [redacted]w[redacted]d by  05/15/2020, by Last Menstrual Period presenting for routine prenatal visit  Plan   pregnancy Problems (from 10/22/19 to present)    Problem Noted Resolved   Anemia of pregnancy 03/01/2020 by Crystal Novak, MD No   Overview Signed 03/01/2020  3:19 PM by Crystal Novak, MD    [x]  heme referral      Encounter for supervision of normal pregnancy in first trimester 10/24/2019 by 12/24/2019, Crystal Randall No   Overview Addendum 03/15/2020  2:36 PM by 03/17/2020, Crystal Randall    [facility]inic [facility] Prenatal Labs  Dating lmp = 11 wk Crystal Randall Blood type: B/Positive/-- (08/04 1147)   Genetic Screen   NIPS:normal xx Antibody:Negative (08/04 1147)  Anatomic 06-09-2004 Complete. female Rubella: <0.90 (08/04 1147)  Varicella: @VZVIGG @  GTT Early: NA               Third trimester:  RPR: Non Reactive (08/04 1147)   Rhogam NA HBsAg: Negative (08/04 1147)   Vaccines TDAP:                       Flu Shot: Covid: HIV: Non Reactive (08/04 1147)   Baby Food Breast                               GBS:   GC/CT:  Contraception  Pap: 2021 negative  CBB     CS/VBAC NA   Support Person [place]  Previous Version       Preterm labor symptoms and general obstetric precautions including but not limited to vaginal bleeding, contractions, leaking of fluid and fetal movement were reviewed in detail with the Crystal Randall. Please refer to After Visit Summary for other counseling recommendations.  Discussed varicosities and that thye shuld be left alone. May re evalaute them as the pregnancy advances. Return in about 2 weeks (around 03/29/2020) for return OB.  Crystal Randall, Crystal Randall  03/15/2020 2:55 PM

## 2020-03-15 NOTE — Addendum Note (Signed)
Addended by: Loran Senters D on: 03/15/2020 03:20 PM   Modules accepted: Orders

## 2020-03-15 NOTE — Addendum Note (Signed)
Addended by: Loran Senters D on: 03/15/2020 03:17 PM   Modules accepted: Orders

## 2020-03-16 ENCOUNTER — Inpatient Hospital Stay: Payer: Medicaid Other

## 2020-03-16 ENCOUNTER — Inpatient Hospital Stay: Payer: Medicaid Other | Attending: Oncology | Admitting: Oncology

## 2020-03-16 ENCOUNTER — Encounter: Payer: Self-pay | Admitting: Oncology

## 2020-03-16 VITALS — BP 118/70 | HR 104 | Temp 98.0°F | Wt 161.6 lb

## 2020-03-16 DIAGNOSIS — O99013 Anemia complicating pregnancy, third trimester: Secondary | ICD-10-CM | POA: Diagnosis not present

## 2020-03-16 DIAGNOSIS — F17210 Nicotine dependence, cigarettes, uncomplicated: Secondary | ICD-10-CM | POA: Insufficient documentation

## 2020-03-16 DIAGNOSIS — Z809 Family history of malignant neoplasm, unspecified: Secondary | ICD-10-CM | POA: Diagnosis not present

## 2020-03-16 LAB — IRON AND TIBC
Iron: 24 ug/dL — ABNORMAL LOW (ref 28–170)
Saturation Ratios: 4 % — ABNORMAL LOW (ref 10.4–31.8)
TIBC: 627 ug/dL — ABNORMAL HIGH (ref 250–450)
UIBC: 603 ug/dL

## 2020-03-16 LAB — CBC
HCT: 24.8 % — ABNORMAL LOW (ref 36.0–46.0)
Hemoglobin: 7.6 g/dL — ABNORMAL LOW (ref 12.0–15.0)
MCH: 20.3 pg — ABNORMAL LOW (ref 26.0–34.0)
MCHC: 30.6 g/dL (ref 30.0–36.0)
MCV: 66.1 fL — ABNORMAL LOW (ref 80.0–100.0)
Platelets: 266 10*3/uL (ref 150–400)
RBC: 3.75 MIL/uL — ABNORMAL LOW (ref 3.87–5.11)
RDW: 16.6 % — ABNORMAL HIGH (ref 11.5–15.5)
WBC: 9.5 10*3/uL (ref 4.0–10.5)
nRBC: 0 % (ref 0.0–0.2)

## 2020-03-16 LAB — FOLATE: Folate: 10 ng/mL (ref >5.9)

## 2020-03-16 LAB — FERRITIN: Ferritin: 3 ng/mL — ABNORMAL LOW (ref 11–307)

## 2020-03-16 LAB — VITAMIN B12: Vitamin B-12: 135 pg/mL — ABNORMAL LOW (ref 180–914)

## 2020-03-17 ENCOUNTER — Other Ambulatory Visit: Payer: Self-pay | Admitting: Oncology

## 2020-03-18 LAB — HGB FRACTIONATION CASCADE
Hgb A2: 2.6 % (ref 1.8–3.2)
Hgb A: 97.4 % (ref 96.4–98.8)
Hgb F: 0 % (ref 0.0–2.0)
Hgb S: 0 %

## 2020-03-20 DIAGNOSIS — Z419 Encounter for procedure for purposes other than remedying health state, unspecified: Secondary | ICD-10-CM | POA: Diagnosis not present

## 2020-03-20 NOTE — L&D Delivery Note (Signed)
Date of delivery: 05/11/2020 Estimated Date of Delivery: 05/15/20 Patient's last menstrual period was 08/09/2019 (exact date). EGA: [redacted]w[redacted]d  Delivery Note At 1:07 AM a viable female was delivered via Vaginal, Spontaneous (Presentation: Leftt Occiput Anterior).   APGAR: 8, 9    Weight: 3070 g, 6 pounds 12 ounces Placenta status: Spontaneous, Intact.  Cord: 3 vessels with the following complications: Long.  Cord pH: NA  Called to see patient.  Mom pushed to deliver a viable female infant.  The head delivered en-caul followed by shoulders, which delivered without difficulty, and the rest of the body.  No nuchal cord noted.  Baby to mom's chest.  Cord clamped and cut after 3 min delay.  No cord blood obtained.  Placenta delivered spontaneously, intact, with a 3-vessel cord.  Hemostasis obtained with IV pitocin and fundal massage.     Anesthesia: Epidural Episiotomy: None Lacerations: None Suture Repair: NA Est. Blood Loss (mL):    Mom to postpartum.  Baby to Couplet care / Skin to Skin.  Rod Can, CNM 05/11/2020, 1:40 AM

## 2020-03-25 ENCOUNTER — Inpatient Hospital Stay: Payer: Medicaid Other | Attending: Oncology

## 2020-03-25 VITALS — BP 103/60 | HR 103 | Temp 98.2°F | Resp 20

## 2020-03-25 DIAGNOSIS — E538 Deficiency of other specified B group vitamins: Secondary | ICD-10-CM | POA: Insufficient documentation

## 2020-03-25 DIAGNOSIS — O99013 Anemia complicating pregnancy, third trimester: Secondary | ICD-10-CM | POA: Diagnosis not present

## 2020-03-25 MED ORDER — SODIUM CHLORIDE 0.9 % IV SOLN
Freq: Once | INTRAVENOUS | Status: AC
  Filled 2020-03-25: qty 250

## 2020-03-25 MED ORDER — SODIUM CHLORIDE 0.9 % IV SOLN
510.0000 mg | Freq: Once | INTRAVENOUS | Status: AC
  Administered 2020-03-25: 510 mg via INTRAVENOUS
  Filled 2020-03-25: qty 17

## 2020-03-25 NOTE — Progress Notes (Signed)
Tolerated Feraheme infusion well. Stable at discharge

## 2020-03-29 ENCOUNTER — Other Ambulatory Visit: Payer: Self-pay

## 2020-03-29 ENCOUNTER — Ambulatory Visit (INDEPENDENT_AMBULATORY_CARE_PROVIDER_SITE_OTHER): Payer: Medicaid Other | Admitting: Obstetrics

## 2020-03-29 VITALS — BP 100/60 | Wt 164.0 lb

## 2020-03-29 DIAGNOSIS — Z3483 Encounter for supervision of other normal pregnancy, third trimester: Secondary | ICD-10-CM

## 2020-03-29 DIAGNOSIS — Z3A33 33 weeks gestation of pregnancy: Secondary | ICD-10-CM

## 2020-03-29 LAB — POCT URINALYSIS DIPSTICK OB
Glucose, UA: NEGATIVE
POC,PROTEIN,UA: NEGATIVE

## 2020-03-29 NOTE — Progress Notes (Signed)
Routine Prenatal Care Visit  Subjective  Crystal Randall is a 30 y.o. G4P1011 at [redacted]w[redacted]d being seen today for ongoing prenatal care.  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  ----------------------------------------------------------------------------------- Patient reports no complaints.    .  .   Crystal Randall Fluid denies.  ----------------------------------------------------------------------------------- The following portions of the patient's history were reviewed and updated as appropriate: allergies, current medications, past family history, past medical history, past social history, past surgical history and problem list. Problem list updated.  Objective  Blood pressure 100/60, weight 164 lb (74.4 kg), last menstrual period 08/09/2019, unknown if currently breastfeeding. Pregravid weight 160 lb (72.6 kg) Total Weight Gain 4 lb (1.814 kg) Urinalysis: Urine Protein Negative  Urine Glucose Negative  Fetal Status:           General:  Alert, oriented and cooperative. Patient is in no acute distress.  Skin: Skin is warm and dry. No rash noted.   Cardiovascular: Normal heart rate noted  Respiratory: Normal respiratory effort, no problems with respiration noted  Abdomen: Soft, gravid, appropriate for gestational age.       Pelvic:  Cervical exam deferred        Extremities: Normal range of motion.     Mental Status: Normal mood and affect. Normal behavior. Normal judgment and thought content.   Assessment   30 y.o. G4P1011 at [redacted]w[redacted]d by  05/15/2020, by Last Menstrual Period presenting for routine prenatal visit  Plan   pregnancy  Problems (from 10/22/19 to present)    Problem Noted Resolved   Anemia of pregnancy 03/01/2020 by Will Bonnet, MD No   Overview Signed 03/01/2020  3:19 PM by Will Bonnet, MD    [x]  heme referral      Encounter for supervision of normal pregnancy in first trimester 10/24/2019 by Rod Can, CNM No   Overview Addendum 03/15/2020  2:36 PM by Imagene Riches, [place] Prenatal Labs  Dating lmp = 11 wk [place] Blood type: B/Positive/-- (08/04 1147)   Genetic Screen   NIPS:normal xx Antibody:Negative (08/04 1147)  Anatomic [place] Complete. female Rubella: <0.90 (08/04 1147)  Varicella: @VZVIGG @  GTT Early: NA               Third trimester:  RPR: Non Reactive (08/04 1147)   Rhogam NA HBsAg: Negative (08/04 1147)   Vaccines TDAP:                       Flu Shot: Covid: HIV: Non Reactive (08/04 1147)   Baby Food Breast                               GBS:   GC/CT:  Contraception  Pap: 2021 negative  CBB     CS/VBAC NA   Support Person Nicki Reaper          Previous Version       Preterm labor symptoms and general obstetric precautions including but not limited to vaginal bleeding, contractions, leaking of fluid and fetal movement were reviewed in detail with the patient. Please refer to After Visit Summary for other  counseling recommendations.   No follow-ups on file.  Imagene Riches, CNM  03/29/2020 3:31 PM

## 2020-03-29 NOTE — Progress Notes (Signed)
ROB- no concerns 

## 2020-04-01 ENCOUNTER — Inpatient Hospital Stay: Payer: Medicaid Other

## 2020-04-01 VITALS — BP 105/59 | HR 96 | Temp 98.3°F | Resp 20

## 2020-04-01 DIAGNOSIS — O99013 Anemia complicating pregnancy, third trimester: Secondary | ICD-10-CM

## 2020-04-01 DIAGNOSIS — E538 Deficiency of other specified B group vitamins: Secondary | ICD-10-CM | POA: Diagnosis not present

## 2020-04-01 MED ORDER — SODIUM CHLORIDE 0.9 % IV SOLN
510.0000 mg | Freq: Once | INTRAVENOUS | Status: AC
  Administered 2020-04-01: 510 mg via INTRAVENOUS
  Filled 2020-04-01: qty 510

## 2020-04-01 MED ORDER — CYANOCOBALAMIN 1000 MCG/ML IJ SOLN
1000.0000 ug | Freq: Once | INTRAMUSCULAR | Status: AC
  Administered 2020-04-01: 1000 ug via INTRAMUSCULAR
  Filled 2020-04-01: qty 1

## 2020-04-01 MED ORDER — SODIUM CHLORIDE 0.9 % IV SOLN
Freq: Once | INTRAVENOUS | Status: AC
  Filled 2020-04-01: qty 250

## 2020-04-12 ENCOUNTER — Ambulatory Visit (INDEPENDENT_AMBULATORY_CARE_PROVIDER_SITE_OTHER): Payer: Medicaid Other | Admitting: Obstetrics & Gynecology

## 2020-04-12 ENCOUNTER — Encounter: Payer: Self-pay | Admitting: Obstetrics & Gynecology

## 2020-04-12 ENCOUNTER — Other Ambulatory Visit: Payer: Self-pay

## 2020-04-12 VITALS — BP 108/60 | Wt 163.0 lb

## 2020-04-12 DIAGNOSIS — O99013 Anemia complicating pregnancy, third trimester: Secondary | ICD-10-CM

## 2020-04-12 DIAGNOSIS — Z3A35 35 weeks gestation of pregnancy: Secondary | ICD-10-CM

## 2020-04-12 DIAGNOSIS — Z3483 Encounter for supervision of other normal pregnancy, third trimester: Secondary | ICD-10-CM

## 2020-04-12 NOTE — Progress Notes (Signed)
+  [address].

## 2020-04-12 NOTE — Progress Notes (Signed)
  Subjective  Fetal Movement? yes Contractions? no Leaking Fluid? no Vaginal Bleeding? no  Objective  BP 108/60   Wt 163 lb (73.9 kg)   LMP 08/09/2019 (Exact Date) Comment: normal  BMI 28.87 kg/m  General: NAD Pumonary: no increased work of breathing Abdomen: gravid, non-tender Extremities: no edema Psychiatric: mood appropriate, affect full  Assessment  30 y.o. G4P1011 at [redacted]w[redacted]d by  05/15/2020, by Last Menstrual Period presenting for routine prenatal visit  Plan   Problem List Items Addressed This Visit      Other   Encounter for supervision of normal pregnancy in first trimester - Primary   Anemia of pregnancy    Other Visit Diagnoses    [redacted] weeks gestation of pregnancy          pregnancy Problems (from 10/22/19 to present)    Problem Noted Resolved   Anemia of pregnancy 03/01/2020 by Will Bonnet, MD No   Overview Signed 03/01/2020  3:19 PM by Will Bonnet, MD    [x]  heme referral      Encounter for supervision of normal pregnancy in first trimester 10/24/2019 by Rod Can, CNM No   Overview Addendum 04/12/2020  2:09 PM by Gae Dry, MD    [facility]inic [facility] Prenatal Labs  Dating lmp = 11 wk [place] Blood type: B/Positive/-- (08/04 1147)   Genetic Screen   NIPS:normal xx Antibody:Negative (08/04 1147)  Anatomic [place] Complete. female Rubella: <0.90 (08/04 1147)  Varicella: Imm  GTT Early: NA               Third trimester:  RPR: Non Reactive (11/29 1554)   Rhogam NA HBsAg: Negative (08/04 1147)   Vaccines TDAP:                       Flu Shot: Covid: HIV: Non Reactive (11/29 1554)   Baby Food Breast                               GBS:   GC/CT:  Contraception Depo vs Nexplanon vs Mirena Pap: 2021 negative  CBB  no   CS/VBAC NA   Support Person Scott          Previous Version     Discussed contraception  PNV, [place], PTL precautions  Barnett Applebaum, MD, [facility], [facility] 04/12/2020  2:21 PM

## 2020-04-12 NOTE — Patient Instructions (Signed)
Thank you for choosing [facility]. As part of our ongoing efforts to improve patient experience, we would appreciate your feedback. Please fill out the short survey that you will receive by mail or MyChart. Your opinion is important to [place]! -Dr Kenton Kingfisher  Next visit: Group B Streptococcus Infection During Pregnancy Group B Streptococcus (GBS) is a type of bacteria that is often found in healthy people. It is commonly found in the rectum, vagina, and intestines. In people who are healthy and not pregnant, the bacteria rarely cause serious illness or complications. However, women who test positive for GBS during pregnancy can pass the bacteria to the baby during childbirth. This can cause serious infection in the baby after birth. Women with GBS may also have infections during their pregnancy or soon after childbirth. The infections include urinary tract infections (UTIs) or infections of the uterus. GBS also increases a woman's risk of complications during pregnancy, such as early labor or delivery, miscarriage, or stillbirth. Routine testing for GBS is recommended for all pregnant women. What are the causes? This condition is caused by bacteria called Streptococcus agalactiae. What increases the risk? You may have a higher risk for GBS infection during pregnancy if you had one during a past pregnancy. What are the signs or symptoms? In most cases, GBS infection does not cause symptoms in pregnant women. If symptoms exist, they may include:  Labor that starts before the 37th week of pregnancy.  A UTI or bladder infection. This may cause a fever, frequent urination, or pain and burning during urination.  Fever during labor. There can also be a rapid heartbeat in the mother or baby. Rare but serious symptoms of a GBS infection in women include:  Blood infection (septicemia). This may cause fever, chills, or confusion.  Lung infection (pneumonia). This may cause fever, chills, cough, rapid  breathing, chest pain, or difficulty breathing.  Bone, joint, skin, or soft tissue infection. How is this diagnosed? You may be screened for GBS between week 35 and week 37 of pregnancy. If you have symptoms of preterm labor, you may be screened earlier. This condition is diagnosed based on lab test results from:  A swab of fluid from the vagina and rectum.  A urine sample. How is this treated? This condition is treated with antibiotic medicine. Antibiotic medicine may be given:  To you when you go into labor, or as soon as your water breaks. The medicines will continue until after you give birth. If you are having a cesarean delivery, you do not need antibiotics unless your water has broken.  To your baby, if he or she requires treatment. Your health care provider will check your baby to decide if he or she needs antibiotics to prevent a serious infection.   Follow these instructions at home:  Take over-the-counter and prescription medicines only as told by your health care provider.  Take your antibiotic medicine as told by your health care provider. Do not stop taking the antibiotic even if you start to feel better.  Keep all pre-birth (prenatal) visits and follow-up visits as told by your health care provider. This is important. Contact a health care provider if:  You have pain or burning when you urinate.  You have to urinate more often than usual.  You have a fever or chills.  You develop a bad-smelling vaginal discharge. Get help right away if:  Your water breaks.  You go into labor.  You have severe pain in your abdomen.  You have  difficulty breathing.  You have chest pain. These symptoms may represent a serious problem that is an emergency. Do not wait to see if the symptoms will go away. Get medical help right away. Call your local emergency services (911 in the [place] Do not drive yourself to the hospital. Summary  GBS is a type of bacteria that is common in  healthy people.  During pregnancy, colonization with GBS can cause serious complications for you or your baby.  Your health care provider will screen you between 35 and 37 weeks of pregnancy to determine if you are colonized with GBS.  If you are colonized with GBS during pregnancy, your health care provider will recommend antibiotics through an IV during labor.  After delivery, your baby will be evaluated for complications related to potential GBS infection and may require antibiotics to prevent a serious infection. This information is not intended to replace advice given to you by your health care provider. Make sure you discuss any questions you have with your health care provider. Document Revised: 01/06/2020 Document Reviewed: 09/30/2018 Elsevier Patient Education  [place].

## 2020-04-17 IMAGING — US US BREAST*R* LIMITED INC AXILLA
1 series · 14 of 15 positions shown · non-contrast
Comparison: Previous exam(s).

CLINICAL DATA: 27-year-old pregnant female with a new palpable mass
in the right breast.

EXAM:
ULTRASOUND OF THE RIGHT BREAST

[Series 1: us breast*right* limited inc axilla · 0.06mm/px · 14 of 15 slices shown]
[im 1/15]
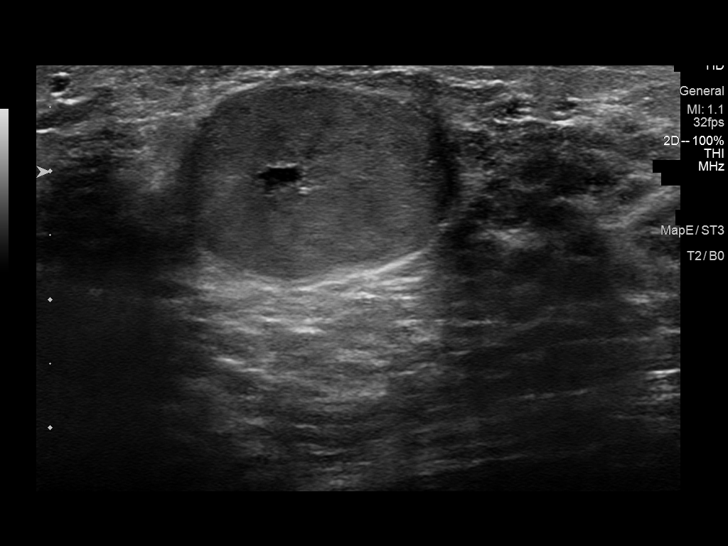
[im 2/15]
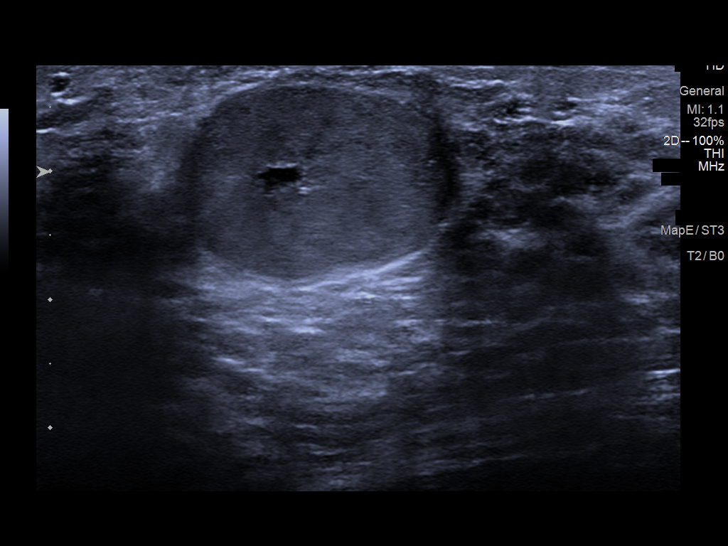
[im 3/15]
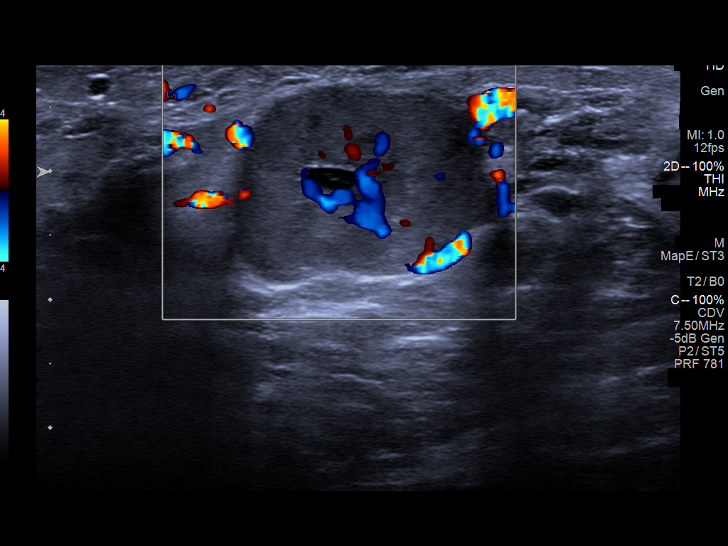
[im 4/15]
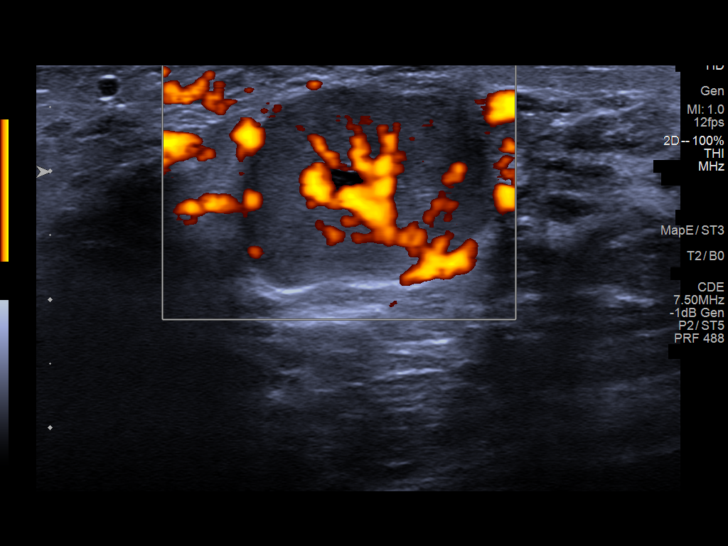
[im 5/15]
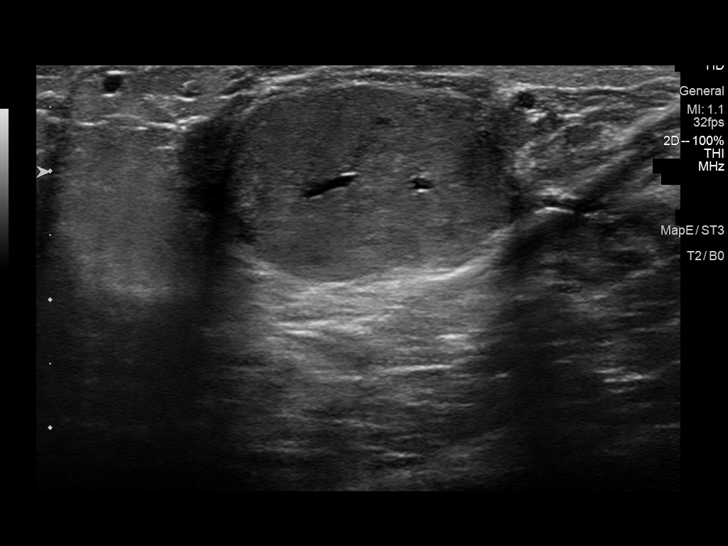
[im 6/15]
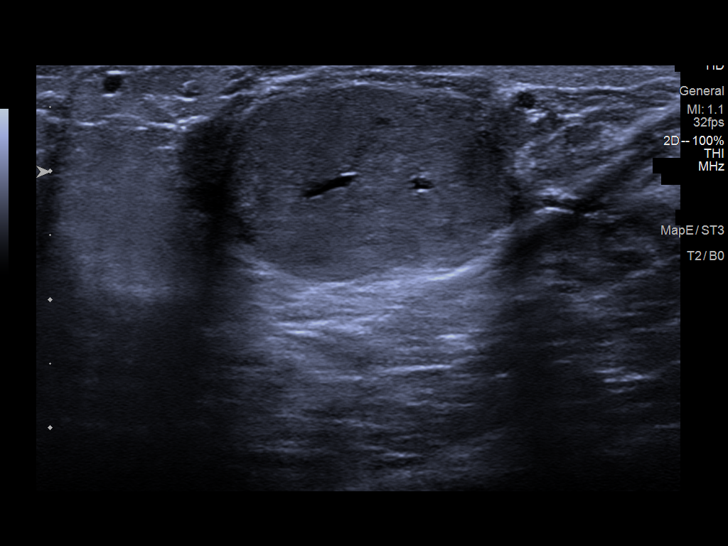
[im 7/15]
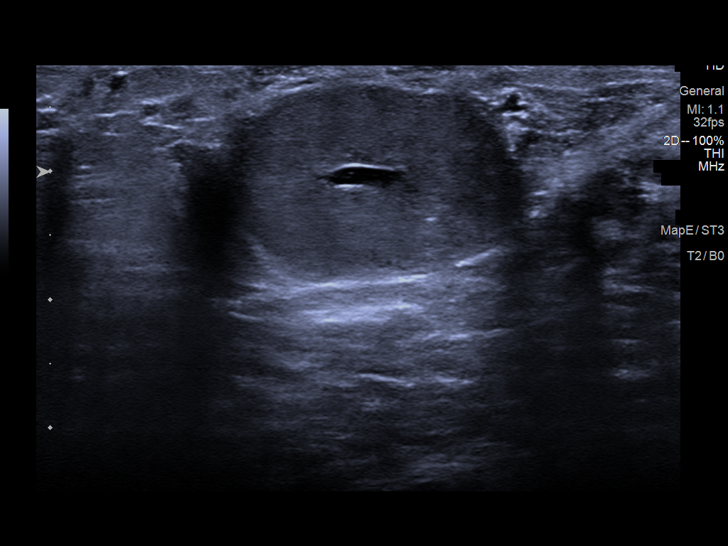
[im 9/15]
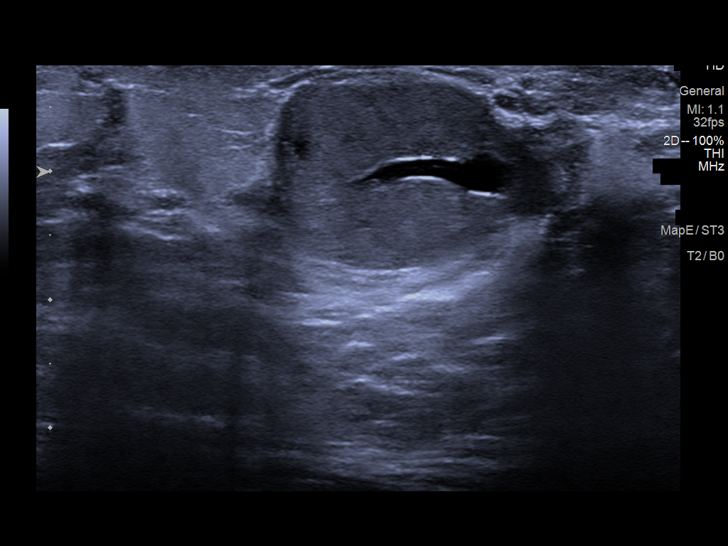
[im 10/15]
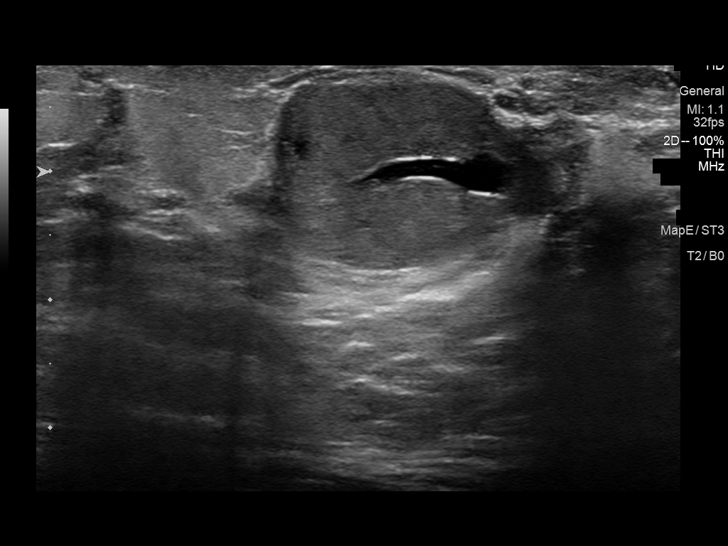
[im 11/15]
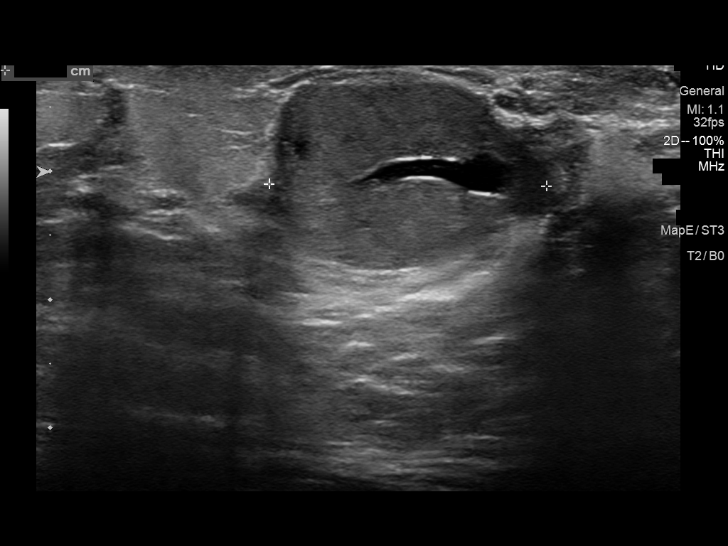
[im 12/15]
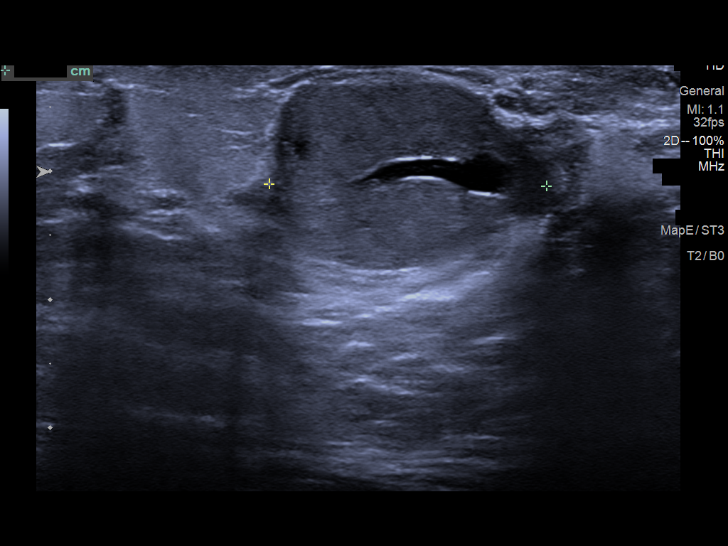
[im 13/15]
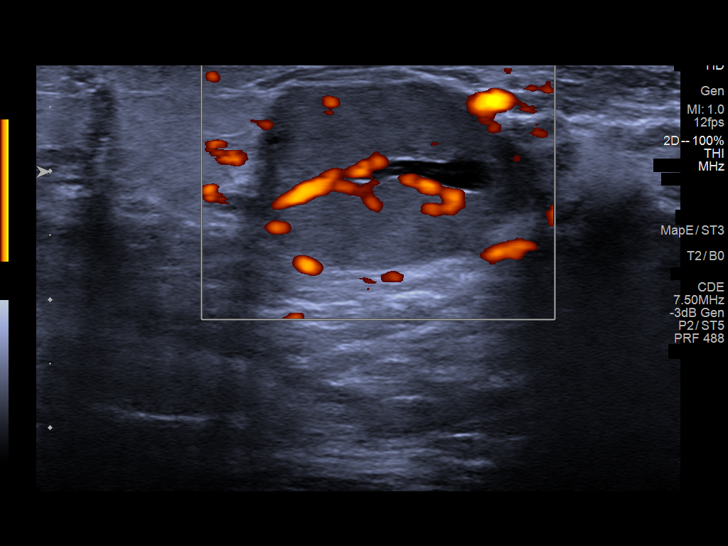
[im 14/15]
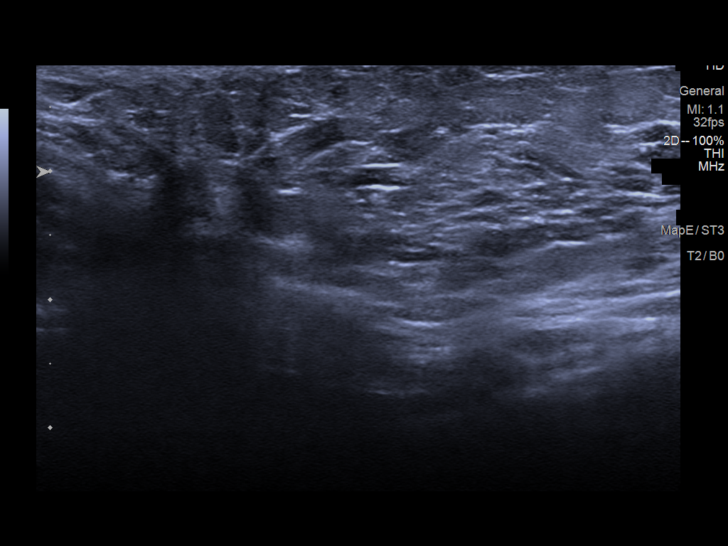
[im 15/15]
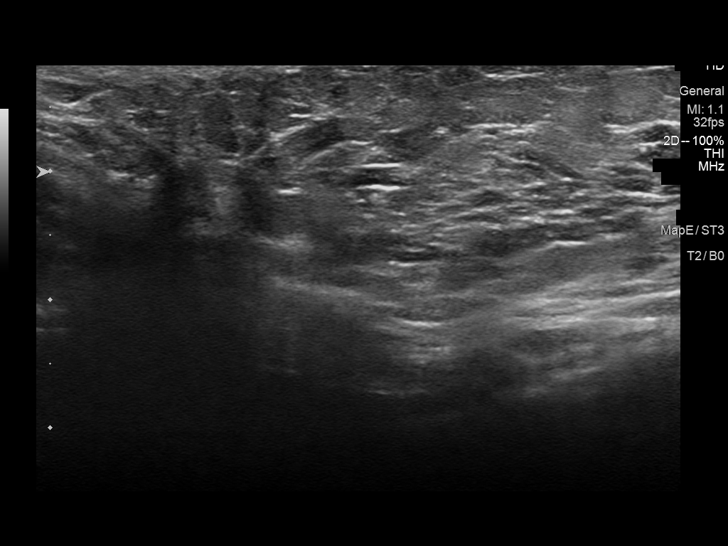

[14 of 15 positions shown; findings below may reference images not displayed]

FINDINGS: On physical exam, I palpate a discrete mass in the right breast at
[DATE] in the retroareolar region.

Targeted ultrasound is performed, showing a well-circumscribed
hypoechoic mass with cystic spaces in the right breast at [DATE] in
the retroareolar region measuring 2.2 x 1.5 x 2.2 cm. It has
internal blood flow. Sonographic evaluation of the right axilla does
not show any enlarged adenopathy.
IMPRESSION: Indeterminate mass in the [DATE] region of the right breast. This may
be a lactating adenoma but given that it is new palpable
ultrasound-guided core biopsy is recommended.

RECOMMENDATION:
Ultrasound-guided core biopsy of the mass in the [DATE] region of the
right breast is recommended.

I have discussed the findings and recommendations with the patient.
Results were also provided in writing at the conclusion of the
visit. If applicable, a reminder letter will be sent to the patient
regarding the next appointment.

BI-RADS CATEGORY  4: Suspicious.

## 2020-04-19 ENCOUNTER — Ambulatory Visit (INDEPENDENT_AMBULATORY_CARE_PROVIDER_SITE_OTHER): Payer: Medicaid Other | Admitting: Obstetrics and Gynecology

## 2020-04-19 ENCOUNTER — Other Ambulatory Visit (HOSPITAL_COMMUNITY)
Admission: RE | Admit: 2020-04-19 | Discharge: 2020-04-19 | Disposition: A | Discharge status: Home / Self Care | Payer: Medicaid Other | Source: Ambulatory Visit | Attending: Obstetrics and Gynecology | Admitting: Obstetrics and Gynecology

## 2020-04-19 ENCOUNTER — Other Ambulatory Visit: Payer: Self-pay

## 2020-04-19 VITALS — BP 110/60 | Wt 164.0 lb

## 2020-04-19 DIAGNOSIS — N760 Acute vaginitis: Secondary | ICD-10-CM | POA: Diagnosis not present

## 2020-04-19 DIAGNOSIS — Z113 Encounter for screening for infections with a predominantly sexual mode of transmission: Secondary | ICD-10-CM | POA: Insufficient documentation

## 2020-04-19 DIAGNOSIS — B9689 Other specified bacterial agents as the cause of diseases classified elsewhere: Secondary | ICD-10-CM | POA: Insufficient documentation

## 2020-04-19 DIAGNOSIS — O26893 Other specified pregnancy related conditions, third trimester: Secondary | ICD-10-CM | POA: Insufficient documentation

## 2020-04-19 DIAGNOSIS — N898 Other specified noninflammatory disorders of vagina: Secondary | ICD-10-CM | POA: Diagnosis not present

## 2020-04-19 DIAGNOSIS — Z3685 Encounter for antenatal screening for Streptococcus B: Secondary | ICD-10-CM | POA: Diagnosis not present

## 2020-04-19 DIAGNOSIS — Z3483 Encounter for supervision of other normal pregnancy, third trimester: Secondary | ICD-10-CM

## 2020-04-19 DIAGNOSIS — Z3A36 36 weeks gestation of pregnancy: Secondary | ICD-10-CM

## 2020-04-19 DIAGNOSIS — O99013 Anemia complicating pregnancy, third trimester: Secondary | ICD-10-CM

## 2020-04-19 LAB — POCT URINALYSIS DIPSTICK OB: Glucose, UA: NEGATIVE

## 2020-04-19 MED ORDER — METRONIDAZOLE 0.75 % VA GEL: 1.0000 | Freq: Every day | VAGINAL | 0 refills | Status: AC

## 2020-04-19 NOTE — Addendum Note (Signed)
Addended byOrlie Pollen on: 04/19/2020 04:54 PM   Modules accepted: Orders

## 2020-04-19 NOTE — Progress Notes (Signed)
Routine Prenatal Care Visit  Subjective  Crystal Randall is a 30 y.o. G4P1011 at [redacted]w[redacted]d being seen today for ongoing prenatal care.  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  ----------------------------------------------------------------------------------- Patient reports yellow vaginal discharge with change in odor..   Contractions: Irregular. Vag. Bleeding: None.  Movement: Present. Denies leaking of fluid.  ----------------------------------------------------------------------------------- The following portions of the patient's history were reviewed and updated as appropriate: allergies, current medications, past family history, past medical history, past social history, past surgical history and problem list. Problem list updated.   Objective  Blood pressure 110/60, weight 164 lb (74.4 kg), last menstrual period 08/09/2019, unknown if currently breastfeeding. Pregravid weight 160 lb (72.6 kg) Total Weight Gain 4 lb (1.814 kg) Urinalysis:      Fetal Status: Fetal Heart Rate (bpm): 145 Fundal Height: 37 cm Movement: Present  Presentation: Vertex  General:  Alert, oriented and cooperative. Patient is in no acute distress.  Skin: Skin is warm and dry. No rash noted.   Cardiovascular: Normal heart rate noted  Respiratory: Normal respiratory effort, no problems with respiration noted  Abdomen: Soft, gravid, appropriate for gestational age. Pain/Pressure: Present     Pelvic:  Cervical exam performed Dilation: Closed Effacement (%): 50 Station: -3  Extremities: Normal range of motion.  Edema: None  ental Status: Normal  mood and affect. Normal behavior. Normal judgment and thought content.   Wet Prep: PH: 4.5 Clue Cells: Positive Fungal elements: Negative Trichomonas: Negative Whiff: Positive  Assessment   30 y.o. G4P1011 at [redacted]w[redacted]d by  05/15/2020, by Last Menstrual Period presenting for routine prenatal visit  Plan   pregnancy Problems (from 10/22/19 to present)    Problem Noted Resolved   Anemia of pregnancy 03/01/2020 by Will Bonnet, MD No   Overview Addendum 04/19/2020  3:12 PM by Orlie Pollen, CNM    [x]  heme referral  Reached out to heme on 04/19/20 to attempt to see ASAP.      Previous Version   Encounter for supervision of other normal pregnancy, third trimester 10/24/2019 by Rod Can, CNM No   Overview Addendum 04/19/2020  3:13 PM by Orlie Pollen, [place] Prenatal Labs  Dating lmp = 11 wk [place] Blood type: B/Positive/-- (08/04 1147)   Genetic Screen   NIPS:normal xx Antibody:Negative (08/04 1147)  Anatomic [place] Complete. female Rubella: <0.90 (08/04 1147)  Varicella: Imm  GTT Early: NA               Third trimester:  RPR: Non Reactive (11/29 1554)   Rhogam NA HBsAg: Negative (08/04 1147)   Vaccines TDAP: 03/15/20                    Flu Shot: declined Covid: HIV: Non Reactive (11/29 1554)   Baby Food Breast                               GBS:   GC/CT:  Contraception  Nexplanon Pap: 2021 negative  CBB  no   CS/VBAC NA   Support Person Scott          Previous Version      -  Discussed plan for PP contraception - patient desires Nexplanon -Reviewed H&H - patient encouraged to take fe supplementation - currently scheduled for heme consult on 2/15. Message sent to office/provider to see if Fe infusion could be expediated - patient near to delivery. -GBS/aptima collected -POC wet mount - +clue cells - Metrogel Rx'd.  Gestational age appropriate obstetric precautions including but not limited to vaginal bleeding, contractions, leaking of fluid and fetal movement  were reviewed in detail with the patient.    Return in about 1 week (around 04/26/2020) for ROB.  Orlie Pollen, CNM, MSN [facility], [employer] 04/19/2020, 4:01 PM

## 2020-04-20 ENCOUNTER — Other Ambulatory Visit: Payer: Self-pay | Admitting: *Deleted

## 2020-04-20 DIAGNOSIS — Z419 Encounter for procedure for purposes other than remedying health state, unspecified: Secondary | ICD-10-CM | POA: Diagnosis not present

## 2020-04-20 DIAGNOSIS — O99013 Anemia complicating pregnancy, third trimester: Secondary | ICD-10-CM

## 2020-04-21 LAB — CERVICOVAGINAL ANCILLARY ONLY
Bacterial Vaginitis (gardnerella): POSITIVE — AB
Candida Glabrata: NEGATIVE
Candida Vaginitis: NEGATIVE
Chlamydia: NEGATIVE
Comment: NEGATIVE
Comment: NEGATIVE
Comment: NEGATIVE
Comment: NEGATIVE
Comment: NEGATIVE
Comment: NORMAL
Neisseria Gonorrhea: NEGATIVE
Trichomonas: NEGATIVE

## 2020-04-21 LAB — STREP GP B NAA: Strep Gp B NAA: NEGATIVE

## 2020-04-26 ENCOUNTER — Encounter: Payer: Medicaid Other | Admitting: Obstetrics and Gynecology

## 2020-04-26 ENCOUNTER — Ambulatory Visit (INDEPENDENT_AMBULATORY_CARE_PROVIDER_SITE_OTHER): Payer: Medicaid Other | Admitting: Obstetrics and Gynecology

## 2020-04-26 ENCOUNTER — Other Ambulatory Visit: Payer: Self-pay

## 2020-04-26 ENCOUNTER — Telehealth: Payer: Self-pay

## 2020-04-26 VITALS — BP 120/70 | Ht 63.0 in | Wt 164.8 lb

## 2020-04-26 DIAGNOSIS — Z3483 Encounter for supervision of other normal pregnancy, third trimester: Secondary | ICD-10-CM

## 2020-04-26 DIAGNOSIS — O99013 Anemia complicating pregnancy, third trimester: Secondary | ICD-10-CM

## 2020-04-26 DIAGNOSIS — Z3A37 37 weeks gestation of pregnancy: Secondary | ICD-10-CM

## 2020-04-26 NOTE — Progress Notes (Signed)
ROB

## 2020-04-26 NOTE — Patient Instructions (Addendum)
https://www.nichd.nih.gov/health/topics/labor-delivery/Pages/default.aspx">  Third Trimester of Pregnancy  The third trimester of pregnancy is from week 28 through week 40. This is months 7 through 9. The third trimester is a time when the unborn baby (fetus) is growing rapidly. At the end of the ninth month, the fetus is about 20 inches long and weighs 6-10 pounds. Body changes during your third trimester During the third trimester, your body will continue to go through many changes. The changes vary and generally return to normal after your baby is born. Physical changes  Your weight will continue to increase. You can expect to gain 25-35 pounds (11-16 kg) by the end of the pregnancy if you begin pregnancy at a normal weight. If you are underweight, you can expect to gain 28-40 lb (about 13-18 kg), and if you are overweight, you can expect to gain 15-25 lb (about 7-11 kg).  You may begin to get stretch marks on your hips, abdomen, and breasts.  Your breasts will continue to grow and may hurt. A yellow fluid (colostrum) may leak from your breasts. This is the first milk you are producing for your baby.  You may have changes in your hair. These can include thickening of your hair, rapid growth, and changes in texture. Some people also have hair loss during or after pregnancy, or hair that feels dry or thin.  Your belly button may stick out.  You may notice more swelling in your hands, face, or ankles. Health changes  You may have heartburn.  You may have constipation.  You may develop hemorrhoids.  You may develop swollen, bulging veins (varicose veins) in your legs.  You may have increased body aches in the pelvis, back, or thighs. This is due to weight gain and increased hormones that are relaxing your joints.  You may have increased tingling or numbness in your hands, arms, and legs. The skin on your abdomen may also feel numb.  You may feel short of breath because of your  expanding uterus. Other changes  You may urinate more often because the fetus is moving lower into your pelvis and pressing on your bladder.  You may have more problems sleeping. This may be caused by the size of your abdomen, an increased need to urinate, and an increase in your body's metabolism.  You may notice the fetus "dropping," or moving lower in your abdomen (lightening).  You may have increased vaginal discharge.  You may notice that you have pain around your pelvic bone as your uterus distends. Follow these instructions at home: Medicines  Follow your health care provider's instructions regarding medicine use. Specific medicines may be either safe or unsafe to take during pregnancy. Do not take any medicines unless approved by your health care provider.  Take a prenatal vitamin that contains at least 600 micrograms (mcg) of folic acid. Eating and drinking  Eat a healthy diet that includes fresh fruits and vegetables, whole grains, good sources of protein such as meat, eggs, or tofu, and low-fat dairy products.  Avoid raw meat and unpasteurized juice, milk, and cheese. These carry germs that can harm you and your baby.  Eat 4 or 5 small meals rather than 3 large meals a day.  You may need to take these actions to prevent or treat constipation: ? Drink enough fluid to keep your urine pale yellow. ? Eat foods that are high in fiber, such as beans, whole grains, and fresh fruits and vegetables. ? Limit foods that are high in fat and   processed sugars, such as fried or sweet foods. Activity  Exercise only as directed by your health care provider. Most people can continue their usual exercise routine during pregnancy. Try to exercise for 30 minutes at least 5 days a week. Stop exercising if you experience contractions in the uterus.  Stop exercising if you develop pain or cramping in the lower abdomen or lower back.  Avoid heavy lifting.  Do not exercise if it is very hot or  humid or if you are at a high altitude.  If you choose to, you may continue to have sex unless your health care provider tells you not to. Relieving pain and discomfort  Take frequent breaks and rest with your legs raised (elevated) if you have leg cramps or low back pain.  Take warm sitz baths to soothe any pain or discomfort caused by hemorrhoids. Use hemorrhoid cream if your health care provider approves.  Wear a supportive bra to prevent discomfort from breast tenderness.  If you develop varicose veins: ? Wear support hose as told by your health care provider. ? Elevate your feet for 15 minutes, 3-4 times a day. ? Limit salt in your diet. Safety  Talk to your health care provider before traveling far distances.  Do not use hot tubs, steam rooms, or saunas.  Wear your seat belt at all times when driving or riding in a car.  Talk with your health care provider if someone is verbally or physically abusive to you. Preparing for birth To prepare for the arrival of your baby:  Take prenatal classes to understand, practice, and ask questions about labor and delivery.  Visit the hospital and tour the maternity area.  Purchase a rear-facing car seat and make sure you know how to install it in your car.  Prepare the baby's room or sleeping area. Make sure to remove all pillows and stuffed animals from the baby's crib to prevent suffocation. General instructions  Avoid cat litter boxes and soil used by cats. These carry germs that can cause birth defects in the baby. If you have a cat, ask someone to clean the litter box for you.  Do not douche or use tampons. Do not use scented sanitary pads.  Do not use any products that contain nicotine or tobacco, such as cigarettes, e-cigarettes, and chewing tobacco. If you need help quitting, ask your health care provider.  Do not use any herbal remedies, illegal drugs, or medicines that were not prescribed to you. Chemicals in these products  can harm your baby.  Do not drink alcohol.  You will have more frequent prenatal exams during the third trimester. During a routine prenatal visit, your health care provider will do a physical exam, perform tests, and discuss your overall health. Keep all follow-up visits. This is important. Where to find more information  American Pregnancy Association: americanpregnancy.org  American College of Obstetricians and Gynecologists: acog.org/en/Womens%20Health/Pregnancy  Office on Women's Health: womenshealth.gov/pregnancy Contact a health care provider if you have:  A fever.  Mild pelvic cramps, pelvic pressure, or nagging pain in your abdominal area or lower back.  Vomiting or diarrhea.  Bad-smelling vaginal discharge or foul-smelling urine.  Pain when you urinate.  A headache that does not go away when you take medicine.  Visual changes or see spots in front of your eyes. Get help right away if:  Your water breaks.  You have regular contractions less than 5 minutes apart.  You have spotting or bleeding from your vagina.  You   have severe abdominal pain.  You have difficulty breathing.  You have chest pain.  You have fainting spells.  You have not felt your baby move for the time period told by your health care provider.  You have new or increased pain, swelling, or redness in an arm or leg. Summary  The third trimester of pregnancy is from week 28 through week 40 (months 7 through 9).  You may have more problems sleeping. This can be caused by the size of your abdomen, an increased need to urinate, and an increase in your body's metabolism.  You will have more frequent prenatal exams during the third trimester. Keep all follow-up visits. This is important. This information is not intended to replace advice given to you by your health care provider. Make sure you discuss any questions you have with your health care provider. Document Revised: 08/13/2019 Document  Reviewed: 06/19/2019 Elsevier Patient Education  [employer].   Vaginal Delivery  Vaginal delivery means that you give birth by pushing your baby out of your birth canal (vagina). A team of health care providers will help you before, during, and after vaginal delivery. Birth experiences are unique for every woman and every pregnancy, and birth experiences vary depending on where you choose to give birth. What happens when I arrive at the birth center or hospital? Once you are in labor and have been admitted into the hospital or birth center, your health care provider may:  Review your pregnancy history and any concerns that you have.  Insert an IV into one of your veins. This may be used to give you fluids and medicines.  Check your blood pressure, pulse, temperature, and heart rate (vital signs).  Check whether your bag of water (amniotic sac) has broken (ruptured).  Talk with you about your birth plan and discuss pain control options. Monitoring Your health care provider may monitor your contractions (uterine monitoring) and your baby's heart rate (fetal monitoring). You may need to be monitored:  Often, but not continuously (intermittently).  All the time or for long periods at a time (continuously). Continuous monitoring may be needed if: ? You are taking certain medicines, such as medicine to relieve pain or make your contractions stronger. ? You have pregnancy or labor complications. Monitoring may be done by:  Placing a special stethoscope or a handheld monitoring device on your abdomen to check your baby's heartbeat and to check for contractions.  Placing monitors on your abdomen (external monitors) to record your baby's heartbeat and the frequency and length of contractions.  Placing monitors inside your uterus through your vagina (internal monitors) to record your baby's heartbeat and the frequency, length, and strength of your contractions. Depending on the type of  monitor, it may remain in your uterus or on your baby's head until birth.  Telemetry. This is a type of continuous monitoring that can be done with external or internal monitors. Instead of having to stay in bed, you are able to move around during telemetry. Physical exam Your health care provider may perform frequent physical exams. This may include:  Checking how and where your baby is positioned in your uterus.  Checking your cervix to determine: ? Whether it is thinning out (effacing). ? Whether it is opening up (dilating). What happens during labor and delivery? Normal labor and delivery is divided into the following three stages: Stage 1  This is the longest stage of labor.  This stage can last for hours or days.  Throughout  this stage, you will feel contractions. Contractions generally feel mild, infrequent, and irregular at first. They get stronger, more frequent (about every 2-3 minutes), and more regular as you move through this stage.  This stage ends when your cervix is completely dilated to 4 inches (10 cm) and completely effaced. Stage 2  This stage starts once your cervix is completely effaced and dilated and lasts until the delivery of your baby.  This stage may last from 20 minutes to 2 hours.  This is the stage where you will feel an urge to push your baby out of your vagina.  You may feel stretching and burning pain, especially when the widest part of your baby's head passes through the vaginal opening (crowning).  Once your baby is delivered, the umbilical cord will be clamped and cut. This usually occurs after waiting a period of 1-2 minutes after delivery.  Your baby will be placed on your bare chest (skin-to-skin contact) in an upright position and covered with a warm blanket. Watch your baby for feeding cues, like rooting or sucking, and help the baby to your breast for his or her first feeding. Stage 3  This stage starts immediately after the birth of  your baby and ends after you deliver the placenta.  This stage may take anywhere from 5 to 30 minutes.  After your baby has been delivered, you will feel contractions as your body expels the placenta and your uterus contracts to control bleeding.   What can I expect after labor and delivery?  After labor is over, you and your baby will be monitored closely until you are ready to go home to ensure that you are both healthy. Your health care team will teach you how to care for yourself and your baby.  You and your baby will stay in the same room (rooming in) during your hospital stay. This will encourage early bonding and successful breastfeeding.  You may continue to receive fluids and medicines through an IV.  Your uterus will be checked and massaged regularly (fundal massage).  You will have some soreness and pain in your abdomen, vagina, and the area of skin between your vaginal opening and your anus (perineum).  If an incision was made near your vagina (episiotomy) or if you had some vaginal tearing during delivery, cold compresses may be placed on your episiotomy or your tear. This helps to reduce pain and swelling.  You may be given a squirt bottle to use instead of wiping when you go to the bathroom. To use the squirt bottle, follow these steps: ? Before you urinate, fill the squirt bottle with warm water. Do not use hot water. ? After you urinate, while you are sitting on the toilet, use the squirt bottle to rinse the area around your urethra and vaginal opening. This rinses away any urine and blood. ? Fill the squirt bottle with clean water every time you use the bathroom.  It is normal to have vaginal bleeding after delivery. Wear a sanitary pad for vaginal bleeding and discharge. Summary  Vaginal delivery means that you will give birth by pushing your baby out of your birth canal (vagina).  Your health care provider may monitor your contractions (uterine monitoring) and your  baby's heart rate (fetal monitoring).  Your health care provider may perform a physical exam.  Normal labor and delivery is divided into three stages.  After labor is over, you and your baby will be monitored closely until you are ready to  go home. This information is not intended to replace advice given to you by your health care provider. Make sure you discuss any questions you have with your health care provider. Document Revised: 04/10/2017 Document Reviewed: 04/10/2017 Elsevier Patient Education  2021 [place].   Pain Relief During Labor and Delivery Many things can cause pain during labor and delivery, including:  Pressure due to the baby moving through the pelvis.  Stretching of tissues due to the baby moving through the birth canal.  Muscle tension due to anxiety or nervousness.  The uterus tightening (contracting)and relaxing to help move the baby. How do I get pain relief during labor and delivery? Discuss your pain relief options with your health care provider during your prenatal visits. Explore the options offered by your hospital or birth center. There are many ways to deal with the pain of labor and delivery. You can try relaxation techniques or doing relaxing activities, taking a warm shower or bath (hydrotherapy), or other methods. There are also many medicines available to help control pain. Relaxation techniques and activities Practice relaxation techniques or do relaxing activities, such as:  Focused breathing.  Meditation.  Visualization.  Aroma therapy.  Listening to your favorite music.  Hypnosis. Hydrotherapy Take a warm shower or bath. This may:  Provide comfort and relaxation.  Lessen your feeling of pain.  Reduce the amount of pain medicine needed.  Shorten the length of labor. Other methods Try doing other things, such as:  Getting a massage or having counterpressure on your back.  Applying warm packs or ice packs.  Changing  positions often, moving around, or using a birthing ball. Medicines You may be given:  Pain medicine through an IV or an injection into a muscle.  Pain medicine inserted into your spinal column.  Injections of sterile water just under the skin on your lower back.  Nitrous oxide inhalation therapy, also called laughing gas.   What kinds of medicine are available for pain relief? There are two kinds of medicines that can be used to relieve pain during labor and delivery:  Analgesics. These medicines decrease pain without causing you to lose feeling or the ability to move your muscles.  Anesthetics. These medicines block feeling in the body and can decrease your ability to move freely. Both kinds of medicine can cause minor side effects, such as nausea, trouble concentrating, and sleepiness. They can also affect the baby's heart rate before birth and his or her breathing after birth. For this reason, health care providers are careful about when and how much medicine is given. Which medicines are used to provide pain relief? Common medicines The most common medicines used to help manage pain during labor and delivery include:  Opioids. Opioids are medicines that decrease how much pain you feel (perception of pain). These medicines can be given through an IV or may be used with anesthetics to block pain.  Epidural analgesia. ? Epidural analgesia is given through a very thin tube that is inserted into the lower back. Medicine is delivered continuously to the area near your spinal column nerves (epidural space). After having this treatment, you may be able to move your legs, but you will not be able to walk. Depending on the amount and type of medicine given, you may lose all feeling in the lower half of your body, or you may have some sensation, including the urge to push. This treatment can be used to give pain relief for a vaginal birth. ? Sometimes, a  numbing medicine is injected into the  spinal fluid when an epidural catheter is placed. This provides for immediate relief but only lasts for 1-2 hours. Once it wears off, the epidural will provide pain relief. This is called a combined spinal-epidural (CSE) block.  Intrathecal analgesia (spinal analgesia). Intrathecal analgesia is similar to epidural analgesia, but the medicine is injected into the spinal fluid instead of the epidural space. It is usually only given once. It starts to relieve pain quickly, but the pain relief lasts only 1-2 hours.  Pudendal block. This block is done by injecting numbing medicine through the wall of the vagina and into a nerve in the pelvis. Other medicines Other medicines used to help manage pain during labor and delivery include:  Local anesthetics. These are used to numb a small area of the body. They may be used along with another kind of medicine or used to numb the nerves of the vagina, cervix, and perineum during the second stage of labor.  Spinal block (spinal anesthesia). Spinal anesthesia is similar to spinal analgesia, but the medicine that is used contains longer-acting numbing medicines and pain medicines. This type of anesthesia can be used for a cesarean delivery and allows you to stay awake for the birth of your baby.  General anesthetics cause you to lose consciousness so you do not feel pain. They are usually only used for an emergency cesarean delivery. These medicines are given through an IV or a mask or both. These medicines are used as part of a procedure or for an emergency delivery. Summary  Women have many options to help them manage the pain associated with labor and delivery.  You can try doing relaxing activities, taking a warm shower or bath, or other methods.  There are also many medicines available to help control pain during labor and delivery.  Talk with your health care provider about what options are available to you. This information is not intended to replace  advice given to you by your health care provider. Make sure you discuss any questions you have with your health care provider. Document Revised: 01/22/2019 Document Reviewed: 01/22/2019 Elsevier Patient Education  2021 [place].   Augmentation of Labor Augmentation of labor is when steps are taken to stimulate and strengthen contractions of the uterus during labor. This may be done when contractions have slowed or stopped, and the progress of labor and delivery of the baby is delayed. Before augmentation of labor begins, these factors will be considered:  The mother's medical condition and the baby's condition.  The size and position of the baby.  The size of the birth canal. Tell a health care provider about:  Any allergies you have.  All medicines you are taking, including vitamins, herbs, eye drops, creams, and over-the-counter medicines.  Any problems you or your family members have had with anesthetic medicines.  Any surgeries you have had.  Any blood disorders you have.  Any medical conditions you have. What are the risks? Generally, this is a safe procedure. However, problems may occur, including:  Tearing (rupture) of the uterus.  Breaking off (abruption) of the placenta.  Increased risk of infection for you and your baby.  Increased risk of cesarean, forceps, or vacuum delivery.  Excessive bleeding after delivery (postpartum hemorrhage).  Umbilical cord prolapse. This can cause the umbilical cord to get squeezed during birth.  Too much stimulation of the contractions. This can result in continuous, prolonged, or very strong contractions.  Your baby could fail  to get enough blood flow or oxygen. This can be life-threatening (fetal death). What happens during the procedure? Augmentation of labor may include:  Giving you medicine that stimulates contractions (oxytocin). This is given through an IV that is inserted into a vein in your arm.  Breaking the  fluid-filled sac that surrounds the fetus (amniotic sac). This procedure is called artificial rupture of membranes. This procedure may vary among health care providers and hospitals.   Summary  Augmentation of labor is when steps are taken to stimulate and strengthen contractions of the uterus during labor. This may be done when contractions have slowed down or stopped, and the progress of labor and delivery of the baby is delayed.  Labor augmentation may be done using medicine to stimulate contractions (oxytocin). Or it can be done by breaking the fluid-filled sac that surrounds the fetus (amniotic sac).  Generally, this is a safe procedure. However, problems can occur. Talk with your health care provider about the potential risks and benefits of labor augmentation if this is offered to you. This information is not intended to replace advice given to you by your health care provider. Make sure you discuss any questions you have with your health care provider. Document Revised: 12/18/2019 Document Reviewed: 12/18/2019 Elsevier Patient Education  2021 [place].   Iron-Rich Diet  Iron is a mineral that helps your body to produce hemoglobin. Hemoglobin is a protein in red blood cells that carries oxygen to your body's tissues. Eating too little iron may cause you to feel weak and tired, and it can increase your risk of infection. Iron is naturally found in many foods, and many foods have iron added to them (iron-fortified foods). You may need to follow an iron-rich diet if you do not have enough iron in your body due to certain medical conditions. The amount of iron that you need each day depends on your age, your sex, and any medical conditions you have. Follow instructions from your health care provider or a diet and nutrition specialist (dietitian) about how much iron you should eat each day. What are tips for following this plan? Reading food labels  Check food labels to see how many  milligrams (mg) of iron are in each serving. Cooking  Cook foods in pots and pans that are made from iron.  Take these steps to make it easier for your body to absorb iron from certain foods: ? Soak beans overnight before cooking. ? Soak whole grains overnight and drain them before using. ? Ferment flours before baking, such as by using yeast in bread dough. Meal planning  When you eat foods that contain iron, you should eat them with foods that are high in vitamin C. These include oranges, peppers, tomatoes, potatoes, and mango. Vitamin C helps your body to absorb iron. General information  Take iron supplements only as told by your health care provider. An overdose of iron can be life-threatening. If you were prescribed iron supplements, take them with orange juice or a vitamin C supplement.  When you eat iron-fortified foods or take an iron supplement, you should also eat foods that naturally contain iron, such as meat, poultry, and fish. Eating naturally iron-rich foods helps your body to absorb the iron that is added to other foods or contained in a supplement.  Certain foods and drinks prevent your body from absorbing iron properly. Avoid eating these foods in the same meal as iron-rich foods or with iron supplements. These foods include: ? Coffee, black  tea, and red wine. ? Milk, dairy products, and foods that are high in calcium. ? Beans and soybeans. ? Whole grains. What foods should I eat? Fruits Prunes. Raisins. Eat fruits high in vitamin C, such as oranges, grapefruits, and strawberries, alongside iron-rich foods. Vegetables Spinach (cooked). Green peas. Broccoli. Fermented vegetables. Eat vegetables high in vitamin C, such as leafy greens, potatoes, bell peppers, and tomatoes, alongside iron-rich foods. Grains Iron-fortified breakfast cereal. Iron-fortified whole-wheat bread. Enriched rice. Sprouted grains. Meats and other proteins Beef liver. Oysters. Beef. Shrimp.  [place]. Chicken. [place]. Sardines. Chickpeas. Nuts. Tofu. Pumpkin seeds. Beverages Tomato juice. Fresh orange juice. Prune juice. Hibiscus tea. Fortified instant breakfast shakes. Sweets and desserts Blackstrap molasses. Seasonings and condiments Tahini. Fermented soy sauce. Other foods Wheat germ. The items listed above may not be a complete list of recommended foods and beverages. Contact a dietitian for more information. What foods should I avoid? Grains Whole grains. Bran cereal. Bran flour. Oats. Meats and other proteins Soybeans. Products made from soy protein. Black beans. Lentils. Mung beans. Split peas. Dairy Milk. Cream. Cheese. Yogurt. Cottage cheese. Beverages Coffee. Black tea. Red wine. Sweets and desserts Cocoa. Chocolate. Ice cream. Other foods Basil. Oregano. Large amounts of parsley. The items listed above may not be a complete list of foods and beverages to avoid. Contact a dietitian for more information. Summary  Iron is a mineral that helps your body to produce hemoglobin. Hemoglobin is a protein in red blood cells that carries oxygen to your body's tissues.  Iron is naturally found in many foods, and many foods have iron added to them (iron-fortified foods).  When you eat foods that contain iron, you should eat them with foods that are high in vitamin C. Vitamin C helps your body to absorb iron.  Certain foods and drinks prevent your body from absorbing iron properly, such as whole grains and dairy products. You should avoid eating these foods in the same meal as iron-rich foods or with iron supplements. This information is not intended to replace advice given to you by your health care provider. Make sure you discuss any questions you have with your health care provider. Document Revised: 02/16/2017 Document Reviewed: 01/30/2017 Elsevier Patient Education  2021 [employer].

## 2020-04-26 NOTE — Progress Notes (Signed)
Routine Prenatal Care Visit  Subjective  Crystal Randall is a 30 y.o. G4P1011 at [redacted]w[redacted]d being seen today for ongoing prenatal care.  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  ----------------------------------------------------------------------------------- Patient reports occassional contractions.   Contractions: Irritability. Vag. Bleeding: None.  Movement: Present. Denies leaking of fluid.  ----------------------------------------------------------------------------------- The following portions of the patient's history were reviewed and updated as appropriate: allergies, current medications, past family history, past medical history, past social history, past surgical history and problem list. Problem list updated.   Objective  Blood pressure 120/70, height 5\' 3"  (1.6 m), weight 164 lb 12.8 oz (74.8 kg), last menstrual period 08/09/2019, unknown if currently breastfeeding. Pregravid weight 160 lb (72.6 kg) Total Weight Gain 4 lb 12.8 oz (2.177 kg) Urinalysis:      Fetal Status: Fetal Heart Rate (bpm): 150 Fundal Height: 37 cm Movement: Present  Presentation: Vertex  General:  Alert, oriented and cooperative. Patient is in no acute distress.  Skin: Skin is warm and dry. No rash noted.   Cardiovascular: Normal heart rate noted  Respiratory: Normal respiratory effort, no problems with respiration noted  Abdomen: Soft, gravid, appropriate for gestational age. Pain/Pressure: Present     Pelvic:  Cervical exam performed Dilation: 2 Effacement (%): 50 Station: -3  Extremities: Normal range of motion.  Edema: None  Mental  Status: Normal mood and affect. Normal behavior. Normal judgment and thought content.     Assessment   30 y.o. G4P1011 at [redacted]w[redacted]d by  05/15/2020, by Last Menstrual Period presenting for routine prenatal visit  Plan   pregnancy Problems (from 10/22/19 to present)    Problem Noted Resolved   Anemia of pregnancy 03/01/2020 by Will Bonnet, MD No   Overview Addendum 04/19/2020  3:12 PM by Orlie Pollen, CNM    [x]  heme referral  Reached out to heme on 04/19/20 to attempt to see ASAP.      Previous Version   Encounter for supervision of other normal pregnancy, third trimester 10/24/2019 by Rod Can, CNM No   Overview Addendum 04/26/2020 11:32 AM by Homero Fellers, MD    [facility]inic [facility] Prenatal Labs  Dating lmp = 11 wk [place] Blood type: B/Positive/-- (08/04 1147)   Genetic Screen   NIPS:normal xx Antibody:Negative (08/04 1147)  Anatomic [place] Complete. 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    Return in about 1 week (around 05/03/2020) for ROB in person.  Homero Fellers MD [facility] 04/26/2020,  11:36 AM

## 2020-04-26 NOTE — Telephone Encounter (Signed)
Pt calling; was seen this am; had cx ck; now has vaginal pain; blood in underwear, in toilet and with wiping like a period.  [place].

## 2020-04-27 NOTE — Telephone Encounter (Signed)
Pt states bleeding had slowed yesterday evening to spotting; this am it has stopped.  Adv if she started bleeding like a period to go to the ED.

## 2020-04-30 ENCOUNTER — Inpatient Hospital Stay: Payer: Medicaid Other

## 2020-04-30 ENCOUNTER — Encounter: Payer: Self-pay | Admitting: Oncology

## 2020-04-30 ENCOUNTER — Inpatient Hospital Stay: Payer: Medicaid Other | Attending: Oncology | Admitting: Oncology

## 2020-04-30 VITALS — BP 110/67 | HR 95 | Temp 98.1°F | Resp 16 | Wt 163.5 lb

## 2020-04-30 VITALS — BP 101/64 | HR 84 | Resp 18

## 2020-04-30 DIAGNOSIS — O99013 Anemia complicating pregnancy, third trimester: Secondary | ICD-10-CM | POA: Diagnosis not present

## 2020-04-30 DIAGNOSIS — Z79899 Other long term (current) drug therapy: Secondary | ICD-10-CM | POA: Insufficient documentation

## 2020-04-30 LAB — CBC WITH DIFFERENTIAL/PLATELET
Abs Immature Granulocytes: 0.08 10*3/uL — ABNORMAL HIGH (ref 0.00–0.07)
Basophils Absolute: 0.1 10*3/uL (ref 0.0–0.1)
Basophils Relative: 1 %
Eosinophils Absolute: 0.3 10*3/uL (ref 0.0–0.5)
Eosinophils Relative: 3 %
HCT: 30.9 % — ABNORMAL LOW (ref 36.0–46.0)
Hemoglobin: 9.8 g/dL — ABNORMAL LOW (ref 12.0–15.0)
Immature Granulocytes: 1 %
Lymphocytes Relative: 20 %
Lymphs Abs: 2.2 10*3/uL (ref 0.7–4.0)
MCH: 22.1 pg — ABNORMAL LOW (ref 26.0–34.0)
MCHC: 31.7 g/dL (ref 30.0–36.0)
MCV: 69.6 fL — ABNORMAL LOW (ref 80.0–100.0)
Monocytes Absolute: 1 10*3/uL (ref 0.1–1.0)
Monocytes Relative: 9 %
Neutro Abs: 7.3 10*3/uL (ref 1.7–7.7)
Neutrophils Relative %: 66 %
Platelets: 195 10*3/uL (ref 150–400)
RBC: 4.44 MIL/uL (ref 3.87–5.11)
Smear Review: ADEQUATE
WBC: 10.9 10*3/uL — ABNORMAL HIGH (ref 4.0–10.5)
nRBC: 0 % (ref 0.0–0.2)

## 2020-04-30 LAB — IRON AND TIBC
Iron: 98 ug/dL (ref 28–170)
Saturation Ratios: 23 % (ref 10.4–31.8)
TIBC: 420 ug/dL (ref 250–450)
UIBC: 322 ug/dL

## 2020-04-30 LAB — FERRITIN: Ferritin: 50 ng/mL (ref 11–307)

## 2020-04-30 MED ORDER — SODIUM CHLORIDE 0.9 % IV SOLN
Freq: Once | INTRAVENOUS | Status: AC
  Filled 2020-04-30: qty 250

## 2020-04-30 MED ORDER — SODIUM CHLORIDE 0.9 % IV SOLN
510.0000 mg | Freq: Once | INTRAVENOUS | Status: AC
  Administered 2020-04-30: 510 mg via INTRAVENOUS
  Filled 2020-04-30: qty 510

## 2020-04-30 NOTE — Progress Notes (Signed)
Patient denies any concerns today.  

## 2020-04-30 NOTE — Progress Notes (Signed)
Pt tolerated feraheme infusion well with no complications noted.  Pt left infusion suite stable and ambulatory.

## 2020-04-30 NOTE — Progress Notes (Signed)
[facility]  [phone] [phone]  ID: Tasia Catchings OB: 28-Nov-1990  MR#: [id number]  VVO#:[id number]  Patient Care Team: Patient, No Pcp Per as PCP - General (General Practice)  CHIEF COMPLAINT: Anemia in third trimester of pregnancy.  INTERVAL HISTORY: Patient is a 30 year old female in the third trimester of her second pregnancy who was noted to have a declining hemoglobin.  She has received 2 doses of IV Feraheme 1 on 04/04/2020 and on 04/01/2020.  She tolerated well.  She has persistent increased weakness and fatigue with mild shortness of breath but otherwise feels well.  She is [redacted] weeks pregnant today. She has no neurologic complaints.  She denies any recent fevers or illnesses.  She has good appetite.  She has no chest pain, cough, or hemoptysis.  She denies any nausea, vomiting, constipation, or diarrhea.  She has no melena or hematochezia.  She has no urinary complaints.  Patient offers no further specific complaints today.  REVIEW OF SYSTEMS:   Review of Systems  Constitutional: Positive for malaise/fatigue. Negative for fever and weight loss.  Respiratory: Negative.  Negative for cough, hemoptysis and shortness of breath.   Cardiovascular: Negative.  Negative for chest pain and leg swelling.  Gastrointestinal: Negative.  Negative for abdominal pain, blood in stool and melena.  Genitourinary: Negative.  Negative for hematuria.  Musculoskeletal: Negative.  Negative for back pain.  Skin: Negative.  Negative for rash.  Neurological: Positive for weakness. Negative for dizziness, focal weakness and headaches.  Psychiatric/Behavioral: Negative.  The patient is not nervous/anxious.     As per HPI. Otherwise, a complete review of systems is negative.  PAST MEDICAL HISTORY: Past Medical History:  Diagnosis Date  . Anemia   . Asthma   . Supervision of normal first pregnancy, antepartum 10/26/2017   [facility] Prenatal Labs Dating LMP  =9wk [place] Blood type: B/Positive/-- (08/09 1604)  Genetic Screen   NIPS: normal XX Antibody:Negative (08/09 1604) Anatomic [place]  complete Rubella: <0.90 (08/09 1604) Varicella: Immune GTT  Third trimester: 84 RPR: Non Reactive (08/09 1604)  Rhogam  not needed HBsAg: Negative (08/09 1604)  TDaP vaccine    Declines                    Flu Shot:12/28/17 HIV: Non Re    PAST SURGICAL HISTORY: Past Surgical History:  Procedure Laterality Date  . BREAST CYST EXCISION Right 01/16/2020   Procedure: CYST EXCISION BREAST, breast mass;  Surgeon: Fredirick Maudlin, MD;  Location: [facility] ORS;  Service: General;  Laterality: Right;  . denies    . NO PAST SURGERIES      FAMILY HISTORY: Family History  Problem Relation Age of Onset  . Hypertension Mother   . Cancer Mother   . Cancer Maternal Grandmother     ADVANCED DIRECTIVES (Y/N):  N  HEALTH MAINTENANCE: Social History   Tobacco Use  . Smoking status: Current Every Day Smoker    Packs/day: 0.25    Years: 2.00    Pack years: 0.50    Types: Cigarettes  . Smokeless tobacco: Never Used  Vaping Use  . Vaping Use: Never used  Substance Use Topics  . Alcohol use: Not Currently  . Drug use: Not Currently    Types: Barbituates     Colonoscopy:  PAP:  Bone density:  Lipid panel:  No Known Allergies  Current Outpatient Medications  Medication Sig Dispense Refill  . Prenatal Vit-Fe Fumarate-FA (PRENATAL VITAMINS) 28-0.8 MG TABS Take  1 tablet by mouth daily.     No current facility-administered medications for this visit.    OBJECTIVE: Vitals:   04/30/20 1156  BP: 110/67  Pulse: 95  Resp: 16  Temp: 98.1 F (36.7 C)  SpO2: 100%     Body mass index is 28.96 kg/m.    ECOG FS:1 - Symptomatic but completely ambulatory  Physical Exam Constitutional:      General: Vital signs are normal.     Appearance: Normal appearance.  HENT:     Head: Normocephalic and atraumatic.  Eyes:     Pupils: Pupils are equal, round, and reactive to light.   Cardiovascular:     Rate and Rhythm: Normal rate and regular rhythm.     Heart sounds: Normal heart sounds. No murmur heard.   Pulmonary:     Effort: Pulmonary effort is normal.     Breath sounds: Normal breath sounds. No wheezing.  Abdominal:     General: Bowel sounds are normal. There is no distension.     Palpations: Abdomen is soft.     Tenderness: There is no abdominal tenderness.  Musculoskeletal:        General: No edema. Normal range of motion.     Cervical back: Normal range of motion.  Skin:    General: Skin is warm and dry.     Findings: No rash.  Neurological:     Mental Status: She is alert and oriented to person, place, and time.  Psychiatric:        Judgment: Judgment normal.     LAB RESULTS:  Lab Results  Component Value Date   NA 138 09/15/2019   K 3.5 09/15/2019   CL 106 09/15/2019   CO2 24 09/15/2019   GLUCOSE 83 09/15/2019   BUN <5 (L) 09/15/2019   CREATININE 0.64 09/15/2019   CALCIUM 8.9 09/15/2019   PROT 7.5 09/15/2019   ALBUMIN 3.8 09/15/2019   AST 23 09/15/2019   ALT 26 09/15/2019   ALKPHOS 64 09/15/2019   BILITOT 0.6 09/15/2019   GFRNONAA >60 09/15/2019   GFRAA >60 09/15/2019    Lab Results  Component Value Date   WBC 10.9 (H) 04/30/2020   NEUTROABS PENDING 04/30/2020   HGB 9.8 (L) 04/30/2020   HCT 30.9 (L) 04/30/2020   MCV 69.6 (L) 04/30/2020   PLT 195 04/30/2020     STUDIES: No results found.  ASSESSMENT: Anemia in third trimester of pregnancy.  PLAN:    1. Anemia in third trimester of pregnancy:  -Hemoglobin initially trended down to 7.3 and MCV 66.1. -Vitamin B12 level was low at 135.  She received one B12 injection on 04/01/2020. -Labs from 04/30/2020 show a ferritin of 50 and saturation ratio of 23%.  Hemoglobin has improved to 9.8. -Proceed with 1 additional dose of IV Feraheme today.  2.  Pregnancy:  -Patient is having a baby girl and her due date is May 15, 2020.  Disposition: -Proceed with IV Feraheme  today. -RTC 6 to 8 weeks after birth of her daughter for repeat lab work (CBC, Ferritin, iron, vitamin B12 )and assessment.  Patient expressed understanding and was in agreement with this plan. She also understands that She can call clinic at any time with any questions, concerns, or complaints.    Jacquelin Hawking, NP   04/30/2020 12:01 PM

## 2020-05-03 ENCOUNTER — Ambulatory Visit (INDEPENDENT_AMBULATORY_CARE_PROVIDER_SITE_OTHER): Payer: Medicaid Other | Admitting: Obstetrics

## 2020-05-03 ENCOUNTER — Other Ambulatory Visit: Payer: Self-pay

## 2020-05-03 VITALS — BP 122/74

## 2020-05-03 DIAGNOSIS — Z3A38 38 weeks gestation of pregnancy: Secondary | ICD-10-CM

## 2020-05-03 DIAGNOSIS — Z3483 Encounter for supervision of other normal pregnancy, third trimester: Secondary | ICD-10-CM

## 2020-05-03 NOTE — Progress Notes (Signed)
Routine Prenatal Care Visit  Subjective  Crystal Randall is a 30 y.o. G4P1011 at [redacted]w[redacted]d being seen today for ongoing prenatal care.  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  ----------------------------------------------------------------------------------- Patient reports no complaints.   Contractions: Irregular. Vag. Bleeding: None.  Movement: Present. Leaking Fluid denies.  ----------------------------------------------------------------------------------- The following portions of the patient's history were reviewed and updated as appropriate: allergies, current medications, past family history, past medical history, past social history, past surgical history and problem list. Problem list updated.  Objective  Blood pressure 122/74, last menstrual period 08/09/2019, unknown if currently breastfeeding. Pregravid weight 160 lb (72.6 kg) Total Weight Gain 4 lb 12.8 oz (2.177 kg) Urinalysis: Urine Protein    Urine Glucose    Fetal Status:     Movement: Present     General:  Alert, oriented and cooperative. Patient is in no acute distress.  Skin: Skin is warm and dry. No rash noted.   Cardiovascular: Normal heart rate noted  Respiratory: Normal respiratory effort, no problems with respiration noted  Abdomen: Soft, gravid, appropriate for gestational age. Pain/Pressure: Absent     Pelvic:  Cervical exam performed      cms/60%/-3. Cervix is posterior and soft  Extremities: Normal range of motion.     Mental Status: Normal mood and affect. Normal behavior. Normal judgment and thought content.   Assessment   30 y.o. G4P1011 at  [redacted]w[redacted]d by  05/15/2020, by Last Menstrual Period presenting for routine prenatal visit  Plan   pregnancy Problems (from 10/22/19 to present)    Problem Noted Resolved   Anemia of pregnancy 03/01/2020 by Will Bonnet, MD No   Overview Addendum 04/19/2020  3:12 PM by Orlie Pollen, CNM    [x]  heme referral  Reached out to heme on 04/19/20 to attempt to see ASAP.      Previous Version   Encounter for supervision of other normal pregnancy, third trimester 10/24/2019 by Rod Can, CNM No   Overview Addendum 04/26/2020 11:32 AM by Homero Fellers, MD    [facility] Prenatal Labs  Dating lmp = 11 wk [place] Blood type: B/Positive/-- (08/04 1147)   Genetic Screen   NIPS:normal xx Antibody:Negative (08/04 1147)  Anatomic [place] Complete. female Rubella: <0.90 (08/04 1147)  Varicella: Imm  GTT Early: NA               Third trimester:  RPR: Non Reactive (11/29 1554)   Rhogam NA HBsAg: Negative (08/04 1147)   Vaccines TDAP: 03/15/20                    Flu Shot: declined Covid: HIV: Non Reactive (11/29 1554)   Baby Food Breast                               GBS:  negative GC/CT:negative  Contraception  Nexplanon Pap: 2021 negative  CBB  no   CS/VBAC NA   Support Person Scott    Pregnancy Diagnoses: Anemia in pregnancy- following with hematology for IV iron, persistently low Short pregnancy interval Fibroadenoma removed from right breast during this pregnancy      Previous Version  Term labor symptoms and general obstetric precautions including but not limited to vaginal bleeding, contractions, leaking of fluid and fetal movement were reviewed in detail with the patient. Please refer to After Visit Summary for other counseling recommendations.  Labor precautions provided.  Return in about 1 week (around 05/17/2020) for return OB.  Imagene Riches, CNM  05/03/2020 3:08 PM

## 2020-05-03 NOTE — Progress Notes (Signed)
Cervical check today. No vb. No lof  

## 2020-05-04 ENCOUNTER — Ambulatory Visit: Payer: Medicaid Other | Admitting: Oncology

## 2020-05-04 ENCOUNTER — Other Ambulatory Visit: Payer: Medicaid Other

## 2020-05-04 ENCOUNTER — Ambulatory Visit: Payer: Medicaid Other

## 2020-05-10 ENCOUNTER — Other Ambulatory Visit: Payer: Self-pay

## 2020-05-10 ENCOUNTER — Inpatient Hospital Stay: Payer: Medicaid Other | Admitting: Anesthesiology

## 2020-05-10 ENCOUNTER — Inpatient Hospital Stay
Admission: EM | Admit: 2020-05-10 | Discharge: 2020-05-12 | DRG: 806 | Disposition: A | Discharge status: Home / Self Care | Payer: Medicaid Other | Attending: Obstetrics and Gynecology | Admitting: Obstetrics and Gynecology

## 2020-05-10 ENCOUNTER — Encounter: Payer: Self-pay | Admitting: Obstetrics and Gynecology

## 2020-05-10 ENCOUNTER — Ambulatory Visit (INDEPENDENT_AMBULATORY_CARE_PROVIDER_SITE_OTHER): Payer: Medicaid Other | Admitting: Obstetrics

## 2020-05-10 VITALS — BP 100/70 | Ht 63.0 in | Wt 162.6 lb

## 2020-05-10 DIAGNOSIS — Z3483 Encounter for supervision of other normal pregnancy, third trimester: Secondary | ICD-10-CM

## 2020-05-10 DIAGNOSIS — F17210 Nicotine dependence, cigarettes, uncomplicated: Secondary | ICD-10-CM | POA: Diagnosis present

## 2020-05-10 DIAGNOSIS — O9081 Anemia of the puerperium: Secondary | ICD-10-CM | POA: Diagnosis not present

## 2020-05-10 DIAGNOSIS — O99334 Smoking (tobacco) complicating childbirth: Principal | ICD-10-CM | POA: Diagnosis present

## 2020-05-10 DIAGNOSIS — Z20822 Contact with and (suspected) exposure to covid-19: Secondary | ICD-10-CM | POA: Diagnosis present

## 2020-05-10 DIAGNOSIS — D62 Acute posthemorrhagic anemia: Secondary | ICD-10-CM | POA: Diagnosis not present

## 2020-05-10 DIAGNOSIS — O09293 Supervision of pregnancy with other poor reproductive or obstetric history, third trimester: Secondary | ICD-10-CM | POA: Diagnosis not present

## 2020-05-10 DIAGNOSIS — O99013 Anemia complicating pregnancy, third trimester: Secondary | ICD-10-CM

## 2020-05-10 DIAGNOSIS — Z3A39 39 weeks gestation of pregnancy: Secondary | ICD-10-CM

## 2020-05-10 DIAGNOSIS — O9903 Anemia complicating the puerperium: Secondary | ICD-10-CM | POA: Diagnosis not present

## 2020-05-10 DIAGNOSIS — O26893 Other specified pregnancy related conditions, third trimester: Secondary | ICD-10-CM | POA: Diagnosis not present

## 2020-05-10 LAB — TYPE AND SCREEN
ABO/RH(D): B POS
Antibody Screen: NEGATIVE

## 2020-05-10 LAB — CBC
HCT: 33.6 % — ABNORMAL LOW (ref 36.0–46.0)
Hemoglobin: 10.5 g/dL — ABNORMAL LOW (ref 12.0–15.0)
MCH: 22.2 pg — ABNORMAL LOW (ref 26.0–34.0)
MCHC: 31.3 g/dL (ref 30.0–36.0)
MCV: 70.9 fL — ABNORMAL LOW (ref 80.0–100.0)
Platelets: 244 10*3/uL (ref 150–400)
RBC: 4.74 MIL/uL (ref 3.87–5.11)
RDW: 23.1 % — ABNORMAL HIGH (ref 11.5–15.5)
WBC: 12.5 10*3/uL — ABNORMAL HIGH (ref 4.0–10.5)
nRBC: 0 % (ref 0.0–0.2)

## 2020-05-10 LAB — RESP PANEL BY RT-PCR (FLU A&B, COVID) ARPGX2
Influenza A by PCR: NEGATIVE
Influenza B by PCR: NEGATIVE
SARS Coronavirus 2 by RT PCR: NEGATIVE

## 2020-05-10 MED ORDER — LACTATED RINGERS IV SOLN: 500.0000 mL | INTRAVENOUS | Status: DC | PRN

## 2020-05-10 MED ORDER — FENTANYL 2.5 MCG/ML W/ROPIVACAINE 0.15% IN NS 100 ML EPIDURAL (ARMC)
12.0000 mL/h | EPIDURAL | Status: DC
  Administered 2020-05-10: 12 mL/h via EPIDURAL

## 2020-05-10 MED ORDER — LACTATED RINGERS IV SOLN: 500.0000 mL | Freq: Once | INTRAVENOUS | Status: DC

## 2020-05-10 MED ORDER — LACTATED RINGERS IV SOLN: INTRAVENOUS | Status: DC

## 2020-05-10 MED ORDER — BUTORPHANOL TARTRATE 1 MG/ML IJ SOLN: 1.0000 mg | INTRAMUSCULAR | Status: DC | PRN

## 2020-05-10 MED ORDER — FENTANYL 2.5 MCG/ML W/ROPIVACAINE 0.15% IN NS 100 ML EPIDURAL (ARMC)
EPIDURAL | Status: AC
  Filled 2020-05-10: qty 100

## 2020-05-10 MED ORDER — PHENYLEPHRINE 40 MCG/ML (10ML) SYRINGE FOR IV PUSH (FOR BLOOD PRESSURE SUPPORT)
80.0000 ug | PREFILLED_SYRINGE | INTRAVENOUS | Status: DC | PRN
  Filled 2020-05-10: qty 10

## 2020-05-10 MED ORDER — OXYTOCIN-SODIUM CHLORIDE 30-0.9 UT/500ML-% IV SOLN
2.5000 [IU]/h | INTRAVENOUS | Status: DC
  Administered 2020-05-10: 2.5 [IU]/h via INTRAVENOUS
  Filled 2020-05-10: qty 500

## 2020-05-10 MED ORDER — ONDANSETRON HCL 4 MG/2ML IJ SOLN: 4.0000 mg | Freq: Four times a day (QID) | INTRAMUSCULAR | Status: DC | PRN

## 2020-05-10 MED ORDER — ACETAMINOPHEN 325 MG PO TABS: 650.0000 mg | ORAL_TABLET | ORAL | Status: DC | PRN

## 2020-05-10 MED ORDER — OXYTOCIN 10 UNIT/ML IJ SOLN
INTRAMUSCULAR | Status: AC
  Filled 2020-05-10: qty 2

## 2020-05-10 MED ORDER — LIDOCAINE HCL (PF) 1 % IJ SOLN: 30.0000 mL | INTRAMUSCULAR | Status: DC | PRN

## 2020-05-10 MED ORDER — MISOPROSTOL 200 MCG PO TABS
ORAL_TABLET | ORAL | Status: AC
  Filled 2020-05-10: qty 4

## 2020-05-10 MED ORDER — OXYTOCIN BOLUS FROM INFUSION
333.0000 mL | Freq: Once | INTRAVENOUS | Status: AC
  Administered 2020-05-11: 333 mL via INTRAVENOUS

## 2020-05-10 MED ORDER — EPHEDRINE 5 MG/ML INJ
10.0000 mg | INTRAVENOUS | Status: DC | PRN
  Filled 2020-05-10: qty 2

## 2020-05-10 MED ORDER — AMMONIA AROMATIC IN INHA
RESPIRATORY_TRACT | Status: AC
  Filled 2020-05-10: qty 10

## 2020-05-10 MED ORDER — DIPHENHYDRAMINE HCL 50 MG/ML IJ SOLN: 12.5000 mg | INTRAMUSCULAR | Status: DC | PRN

## 2020-05-10 NOTE — Progress Notes (Signed)
Routine Prenatal Care Visit  Subjective  Crystal Randall is a 30 y.o. G4P1011 at [redacted]w[redacted]d being seen today for ongoing prenatal care.  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  ----------------------------------------------------------------------------------- Patient reports no complaints.  She is eager for labor Contractions: Irritability. Vag. Bleeding: None.  Movement: Present. Leaking Fluid denies.  ----------------------------------------------------------------------------------- The following portions of the patient's history were reviewed and updated as appropriate: allergies, current medications, past family history, past medical history, past social history, past surgical history and problem list. Problem list updated.  Objective  Blood pressure 100/70, height 5\' 3"  (1.6 m), weight 162 lb 9.6 oz (73.8 kg), last menstrual period 08/09/2019, unknown if currently breastfeeding. Pregravid weight 160 lb (72.6 kg) Total Weight Gain 2 lb 9.6 oz (1.179 kg) Urinalysis: Urine Protein    Urine Glucose    Fetal Status:     Movement: Present     General:  Alert, oriented and cooperative. Patient is in no acute distress.  Skin: Skin is warm and dry. No rash noted.   Cardiovascular: Normal heart rate noted  Respiratory: Normal respiratory effort, no problems with respiration noted  Abdomen: Soft, gravid, appropriate for gestational age. Pain/Pressure: Absent     Pelvic:  Cervical exam performed      3+ cms/70%/-3 . Soft. Sweep performed.  Extremities: Normal range of motion.     Mental Status: Normal mood and affect. Normal  behavior. Normal judgment and thought content.   Assessment   30 y.o. G4P1011 at [redacted]w[redacted]d by  05/15/2020, by Last Menstrual Period presenting for routine prenatal visit  Plan   pregnancy Problems (from 10/22/19 to present)    Problem Noted Resolved   Anemia of pregnancy 03/01/2020 by Will Bonnet, MD No   Overview Addendum 04/19/2020  3:12 PM by Orlie Pollen, CNM    [x]  heme referral  Reached out to heme on 04/19/20 to attempt to see ASAP.      Previous Version   Encounter for supervision of other normal pregnancy, third trimester 10/24/2019 by Rod Can, CNM No   Overview Addendum 04/26/2020 11:32 AM by Homero Fellers, MD    [facility] Prenatal Labs  Dating lmp = 11 wk [place] Blood type: B/Positive/-- (08/04 1147)   Genetic Screen   NIPS:normal xx Antibody:Negative (08/04 1147)  Anatomic [place] Complete. female Rubella: <0.90 (08/04 1147)  Varicella: Imm  GTT Early: NA               Third trimester:  RPR: Non Reactive (11/29 1554)   Rhogam NA HBsAg: Negative (08/04 1147)   Vaccines TDAP: 03/15/20                    Flu Shot: declined Covid: HIV: Non Reactive (11/29 1554)   Baby Food Breast                               GBS:  negative GC/CT:negative  Contraception  Nexplanon Pap: 2021 negative  CBB  no   CS/VBAC NA   Support Person Scott    Pregnancy Diagnoses: Anemia in pregnancy- following with hematology for IV iron, persistently low Short pregnancy  interval Fibroadenoma removed from right breast during this pregnancy      Previous Version       Term labor symptoms and general obstetric precautions including but not limited to vaginal bleeding, contractions, leaking of fluid and fetal movement were reviewed in detail with the patient. Please refer to After Visit Summary for other counseling recommendations.  Labor precautions provided.  Return in about 1 week (around 05/17/2020) for return OB, discuss IOL.  Imagene Riches, CNM  05/10/2020 3:45 PM

## 2020-05-10 NOTE — Anesthesia Preprocedure Evaluation (Signed)
Anesthesia Evaluation  Patient identified by MRN, date of birth, ID band Patient awake    Reviewed: Allergy & Precautions, H&P , NPO status , Patient's Chart, lab work & pertinent test results, reviewed documented beta blocker date and time   History of Anesthesia Complications Negative for: history of anesthetic complications  Airway Mallampati: III  TM Distance: >3 FB Neck ROM: full    Dental no notable dental hx.    Pulmonary neg shortness of breath, asthma , neg sleep apnea, neg COPD, neg recent URI, Current Smoker and Patient abstained from smoking.,    Pulmonary exam normal breath sounds clear to auscultation       Cardiovascular Exercise Tolerance: Good negative cardio ROS Normal cardiovascular exam Rhythm:regular Rate:Normal     Neuro/Psych negative neurological ROS  negative psych ROS   GI/Hepatic Neg liver ROS, GERD  ,  Endo/Other  negative endocrine ROS  Renal/GU negative Renal ROS  negative genitourinary   Musculoskeletal   Abdominal   Peds  Hematology  (+) Blood dyscrasia, anemia ,   Anesthesia Other Findings Past Medical History: No date: Anemia No date: Asthma 10/26/2017: Supervision of normal first pregnancy, antepartum     Comment:  [facility]inic [facility] Prenatal Labs Dating LMP =9wk [place] Blood               type: B/Positive/-- (08/09 1604)  Genetic Screen   NIPS:               normal XX Antibody:Negative (08/09 1604) Anatomic [place]                complete Rubella: <0.90 (08/09 1604) Varicella: Immune               GTT  Third trimester: 84 RPR: Non Reactive (08/09 1604)                Rhogam  not needed HBsAg: Negative (08/09 1604)  TDaP               vaccine    Declines                    Flu               Shot:12/28/17 HIV: Non Re   Reproductive/Obstetrics negative OB ROS                             Anesthesia Physical Anesthesia Plan  ASA: II  Anesthesia Plan: Epidural    Post-op Pain Management:    Induction:   PONV Risk Score and Plan:   Airway Management Planned:   Additional Equipment:   Intra-op Plan:   Post-operative Plan:   Informed Consent: I have reviewed the patients History and Physical, chart, labs and discussed the procedure including the risks, benefits and alternatives for the proposed anesthesia with the patient or authorized representative who has indicated his/her understanding and acceptance.     Dental Advisory Given  Plan Discussed with: Anesthesiologist, CRNA and Surgeon  Anesthesia Plan Comments:         Anesthesia Quick Evaluation

## 2020-05-10 NOTE — H&P (Signed)
OB History & Physical   History of Present Illness:  Chief Complaint: contractions  HPI:  Crystal Randall is a 30 y.o. G21P1011 female at [redacted]w[redacted]d dated by LMP c/w 11w u/s.  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    She reports contractions since 7 pm tonight.   She denies leakage of fluid.   She denies vaginal bleeding.   She reports fetal movement.    Total weight gain for pregnancy: 0.907 kg   Obstetrical Problem List: pregnancy Problems (from 10/22/19 to present)    Problem Noted Resolved   Anemia of pregnancy 03/01/2020 by Will Bonnet, MD No   Overview Addendum 04/19/2020  3:12 PM by Orlie Pollen, CNM    [x]  heme referral  Reached out to heme on 04/19/20 to attempt to see ASAP.      Previous Version   Encounter for supervision of other normal pregnancy, third trimester 10/24/2019 by Rod Can, CNM No   Overview Addendum 04/26/2020 11:32 AM by Homero Fellers, MD    [facility]inic [facility] Prenatal Labs  Dating lmp = 11 wk [place] Blood type: B/Positive/-- (08/04 1147)   Genetic Screen   NIPS:normal xx Antibody:Negative (08/04 1147)  Anatomic [place] Complete. 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 Anemia   . Asthma   . Supervision of normal first pregnancy, antepartum 10/26/2017   [facility] Prenatal Labs Dating LMP =9wk [place] Blood type: B/Positive/-- (08/09 1604)  Genetic Screen   NIPS: normal XX Antibody:Negative (08/09 1604) Anatomic [place]  complete Rubella: <0.90 (08/09 1604) Varicella: Immune GTT  Third trimester: 84 RPR: Non Reactive (08/09 1604)  Rhogam  not needed HBsAg: Negative (08/09 1604)  TDaP vaccine    Declines                    Flu Shot:12/28/17 HIV: Non Re    Past Surgical History:  Procedure Laterality Date  . BREAST CYST EXCISION Right 01/16/2020   Procedure: CYST EXCISION BREAST, breast mass;  Surgeon: Fredirick Maudlin, MD;  Location: [facility] ORS;  Service: General;  Laterality: Right;  . denies    . NO PAST SURGERIES      No Known Allergies  Prior to Admission medications   Medication Sig Start Date End Date Taking? Authorizing Provider  Prenatal Vit-Fe Fumarate-FA (PRENATAL VITAMINS) 28-0.8 MG  TABS Take 1 tablet by mouth daily.   Yes [provider]    OB History  Gravida Para Term Preterm AB Living  4 1 1  0 1 1  SAB IAB Ectopic Multiple Live Births  0 0 0 0 1    # Outcome Date GA Lbr Len/2nd Weight Sex Delivery Anes PTL Lv  4 Current           3 AB 05/2019          2 Term 06/04/18 [redacted]w[redacted]d 01:42 / 00:24 2750 g F Vag-Spont EPI  LIV  1 Gravida             Prenatal care site: [facility]  Social History: She  reports that she has been smoking cigarettes. She has a 0.50 pack-year smoking history. She has never used smokeless tobacco. She reports previous alcohol use. She reports previous drug use. Drug: Barbiturates.  Family History: family history includes Cancer in her maternal grandmother and mother; Hypertension in her mother.    Review of Systems:  Review of Systems  Constitutional: Negative for chills and fever.  HENT: Negative for congestion, ear discharge, ear pain, hearing loss, sinus pain and  sore throat.   Eyes: Negative for blurred vision and double vision.  Respiratory: Negative for cough, shortness of breath and wheezing.   Cardiovascular: Negative for chest pain, palpitations and leg swelling.  Gastrointestinal: Positive for abdominal pain. Negative for blood in stool, constipation, diarrhea, heartburn, melena, nausea and vomiting.  Genitourinary: Negative for dysuria, flank pain, frequency, hematuria and urgency.  Musculoskeletal: Negative for back pain, joint pain and myalgias.  Skin: Negative for itching and rash.  Neurological: Negative for dizziness, tingling, tremors, sensory change, speech change, focal weakness, seizures, loss of consciousness, weakness and headaches.  Endo/Heme/Allergies: Negative for environmental allergies. Does not bruise/bleed easily.  Psychiatric/Behavioral: Negative for depression, hallucinations, memory loss, substance abuse and suicidal ideas. The patient is not nervous/anxious and does not have insomnia.      Physical Exam:  BP 120/67 (BP Location: Left Arm)   Pulse (!) 105   Temp 97.9 F (36.6 C) (Oral)   Resp 20   Ht 5\' 3"  (1.6 m)   Wt 73.5 kg   LMP 08/09/2019 (Exact Date) Comment: normal  SpO2 100%   BMI 28.70 kg/m   Constitutional: Well nourished, well developed female in no acute distress.  HEENT: normal Skin: Warm and dry.  Cardiovascular: Regular rate and rhythm.   Extremity: no edema  Respiratory: Clear to auscultation bilateral. Normal respiratory effort Abdomen: FHT present Back: no CVAT Neuro: DTRs 2+, Cranial nerves grossly intact Psych: Alert and Oriented x3. No memory deficits. Normal mood and affect.  MS: normal gait, normal bilateral lower extremity ROM/strength/stability.  Pelvic exam: per RN B. Judy Pimple 4/80/-1  Toco: q 3-4 minutes Fetal well being: 140 bpm, moderate variability, +accelerations, -decelerations    Lab Results  Component Value Date   SARSCOV2NAA POSITIVE (A) 12/03/2019   Current covid  test pending  Assessment:  Crystal Randall is a 29 y.o. G60P1011 female at [redacted]w[redacted]d with active labor.   Plan:  1. Admit to Labor & Delivery  2. CBC, T&S, Clrs, IVF 3. GBS negative.   4. Fetal well-being: Category I 5. Expectant management for vaginal delivery   Rod Can, CNM 05/10/2020 10:00 PM

## 2020-05-10 NOTE — OB Triage Note (Signed)
Pt presents c/o ctx since 7pm. Pt rates ctx 10/10 on pain scale in her lower belly and back. Pt states she was seen in the office today and hey stripped her membranes. Pt denies bleeding or LOF. Reports positive fetal movement. All VSS. Will continue to monitor.

## 2020-05-11 ENCOUNTER — Encounter: Payer: Self-pay | Admitting: Advanced Practice Midwife

## 2020-05-11 DIAGNOSIS — Z3A39 39 weeks gestation of pregnancy: Secondary | ICD-10-CM

## 2020-05-11 DIAGNOSIS — O9903 Anemia complicating the puerperium: Secondary | ICD-10-CM

## 2020-05-11 DIAGNOSIS — O09293 Supervision of pregnancy with other poor reproductive or obstetric history, third trimester: Secondary | ICD-10-CM

## 2020-05-11 DIAGNOSIS — D62 Acute posthemorrhagic anemia: Secondary | ICD-10-CM

## 2020-05-11 LAB — RPR: RPR Ser Ql: NONREACTIVE

## 2020-05-11 MED ORDER — PRENATAL MULTIVITAMIN CH
1.0000 | ORAL_TABLET | Freq: Every day | ORAL | Status: DC
  Administered 2020-05-11: 1 via ORAL
  Filled 2020-05-11: qty 1

## 2020-05-11 MED ORDER — SIMETHICONE 80 MG PO CHEW: 80.0000 mg | CHEWABLE_TABLET | ORAL | Status: DC | PRN

## 2020-05-11 MED ORDER — LIDOCAINE HCL (PF) 1 % IJ SOLN
INTRAMUSCULAR | Status: DC | PRN
  Administered 2020-05-10: 2 mL via SUBCUTANEOUS

## 2020-05-11 MED ORDER — IBUPROFEN 600 MG PO TABS
600.0000 mg | ORAL_TABLET | Freq: Four times a day (QID) | ORAL | Status: DC
  Administered 2020-05-11 (×3): 600 mg via ORAL
  Filled 2020-05-11 (×2): qty 1

## 2020-05-11 MED ORDER — ONDANSETRON HCL 4 MG/2ML IJ SOLN: 4.0000 mg | INTRAMUSCULAR | Status: DC | PRN

## 2020-05-11 MED ORDER — DIBUCAINE (PERIANAL) 1 % EX OINT: 1.0000 "application " | TOPICAL_OINTMENT | CUTANEOUS | Status: DC | PRN

## 2020-05-11 MED ORDER — ONDANSETRON HCL 4 MG PO TABS: 4.0000 mg | ORAL_TABLET | ORAL | Status: DC | PRN

## 2020-05-11 MED ORDER — COCONUT OIL OIL
1.0000 | TOPICAL_OIL | Status: DC | PRN
Start: 2020-05-11 — End: 2020-05-12

## 2020-05-11 MED ORDER — BENZOCAINE-MENTHOL 20-0.5 % EX AERO: 1.0000 "application " | INHALATION_SPRAY | CUTANEOUS | Status: DC | PRN

## 2020-05-11 MED ORDER — DIPHENHYDRAMINE HCL 25 MG PO CAPS: 25.0000 mg | ORAL_CAPSULE | Freq: Four times a day (QID) | ORAL | Status: DC | PRN

## 2020-05-11 MED ORDER — WITCH HAZEL-GLYCERIN EX PADS: 1.0000 "application " | MEDICATED_PAD | CUTANEOUS | Status: DC | PRN

## 2020-05-11 MED ORDER — MEASLES, MUMPS & RUBELLA VAC IJ SOLR
0.5000 mL | INTRAMUSCULAR | Status: DC | PRN
  Filled 2020-05-11: qty 0.5

## 2020-05-11 MED ORDER — SENNOSIDES-DOCUSATE SODIUM 8.6-50 MG PO TABS
2.0000 | ORAL_TABLET | Freq: Every day | ORAL | Status: DC
  Administered 2020-05-12: 2 via ORAL
  Filled 2020-05-11: qty 2

## 2020-05-11 MED ORDER — SODIUM CHLORIDE 0.9 % IV SOLN
INTRAVENOUS | Status: DC | PRN
  Administered 2020-05-10 (×2): 4 mL via EPIDURAL

## 2020-05-11 MED ORDER — LIDOCAINE-EPINEPHRINE (PF) 1.5 %-1:200000 IJ SOLN
INTRAMUSCULAR | Status: DC | PRN
  Administered 2020-05-10: 2 mL via PERINEURAL
  Administered 2020-05-10: 3 mL via PERINEURAL

## 2020-05-11 MED ORDER — ACETAMINOPHEN 325 MG PO TABS
650.0000 mg | ORAL_TABLET | ORAL | Status: DC | PRN
  Administered 2020-05-11 – 2020-05-12 (×3): 650 mg via ORAL
  Filled 2020-05-11 (×3): qty 2

## 2020-05-11 NOTE — Anesthesia Postprocedure Evaluation (Signed)
Anesthesia Post Note  Patient: Crystal Randall  Procedure(s) Performed: AN AD HOC LABOR EPIDURAL  Patient location during evaluation: Mother Baby Anesthesia Type: Epidural Level of consciousness: awake and alert Pain management: pain level controlled Vital Signs Assessment: post-procedure vital signs reviewed and stable Respiratory status: spontaneous breathing, nonlabored ventilation and respiratory function stable Cardiovascular status: stable Postop Assessment: no headache, no backache and epidural receding Anesthetic complications: no   No complications documented.   Last Vitals:  Vitals:   05/11/20 0327 05/11/20 0700  BP: 107/62 111/76  Pulse: 90 96  Resp:  15  Temp: 36.7 C 37.1 C  SpO2:  100%    Last Pain:  Vitals:   05/11/20 0700  TempSrc: Oral  PainSc: 4                  Bachich,  Clearnce Sorrel

## 2020-05-11 NOTE — Discharge Summary (Signed)
OB Discharge Summary     Patient Name: Crystal Randall DOB: Aug 23, 1990 MRN: [id number]  Date of admission: 05/10/2020 Delivering provider: Rod Can, CNM  Date of Delivery: 05/11/2020  Date of discharge: 05/12/2020  Admitting diagnosis: Labor and delivery, indication for care [O75.9] Intrauterine pregnancy: [redacted]w[redacted]d     Secondary diagnosis: Anemia     Discharge diagnosis: Term Pregnancy Delivered                                                                                                Post partum procedures:none  Augmentation: N/A  Complications: None  Hospital course:  Onset of Labor With Vaginal Delivery      30 y.o. yo G3P1011 at [redacted]w[redacted]d was admitted in Active Labor on 05/10/2020. Patient had an uncomplicated labor course as follows:  Membrane Rupture Time/Date: 1:07 AM ,05/11/2020   Delivery Method:Vaginal, Spontaneous  Episiotomy: None  Lacerations:  None  Patient had an uncomplicated postpartum course.  She is tolerating regular diet. Her pain is controlled with PO medications. She is ambulating and voiding without difficulty.   Patient is discharged home in stable condition on 05/12/20.  Newborn Data: Birth date:05/11/2020  Birth time:1:07 AM  Gender:Female  [place] Living status:Living  Apgars: 8,9  Weight: 3070 g, 6 pounds 12 ounces   Physical exam  Vitals:   05/11/20 1611 05/11/20 2007 05/11/20 2319 05/12/20 0750  BP: 113/73 111/66 100/62 111/72  Pulse: 73 84 85 79  Resp: 18 16 20 20   Temp: 98.5 F (36.9 C) 97.8 F (36.6 C) 97.6 F (36.4 C) 97.9 F (36.6 C)  TempSrc: Oral Oral Oral Oral  SpO2: 98% 99% 100% 98%  Weight:      Height:       General: alert, cooperative and no distress Lochia: appropriate Uterine Fundus: firm Incision: N/A DVT Evaluation: No evidence of DVT seen on physical exam. Negative Homan's sign. No cords or calf tenderness.  Labs: Lab Results  Component Value Date   WBC 14.9 (H) 05/12/2020   HGB 10.4 (L) 05/12/2020   HCT  32.7 (L) 05/12/2020   MCV 70.2 (L) 05/12/2020   PLT 278 05/12/2020    Discharge instruction: per After Visit Summary.  Medications:  Allergies as of 05/12/2020   No Known Allergies     Medication List    TAKE these medications   Prenatal Vitamins 28-0.8 MG Tabs Take 1 tablet by mouth daily.       Diet: routine diet  Activity: Advance as tolerated. Pelvic rest for 6 weeks.   Outpatient follow up:  Follow-up Information    Rod Can, CNM. Schedule an appointment as soon as possible for a visit in 6 week(s).   Specialty: Obstetrics Why: Please call to schedule a 6 week postpartum follow up appointment and Nexplanon insertion with Rod Can Contact information: [address] [place] [address] [phone]                 Postpartum contraception: Nexplanon Rhogam Given postpartum: NA Rubella vaccine given postpartum: no Varicella vaccine given postpartum: Immune TDaP given antepartum or postpartum: given antepartum  Newborn Delivery   Birth date/time: 05/11/2020 01:07:00 Delivery type: Vaginal, Spontaneous       Baby Feeding: Breast and bottle  Disposition:home with mother  SIGNED:  Imagene Riches, CNM  05/12/2020 12:11 PM   05/12/2020 12:11 PM

## 2020-05-11 NOTE — Anesthesia Procedure Notes (Signed)
Epidural Patient location during procedure: OB Start time: 05/10/2020 11:34 PM End time: 05/10/2020 11:38 PM  Staffing Anesthesiologist: Martha Clan, MD Performed: anesthesiologist   Preanesthetic Checklist Completed: patient identified, IV checked, site marked, risks and benefits discussed, surgical consent, monitors and equipment checked, pre-op evaluation and timeout performed  Epidural Patient position: sitting Prep: ChloraPrep Patient monitoring: heart rate, continuous pulse ox and blood pressure Approach: midline Location: L3-L4 Injection technique: LOR saline  Needle:  Needle type: Tuohy  Needle gauge: 17 G Needle length: 9 cm and 9 Needle insertion depth: 3.5 cm Catheter type: closed end flexible Catheter size: 19 Gauge Catheter at skin depth: 8.5 cm Test dose: negative and 1.5% lidocaine with Epi 1:200 K  Assessment Sensory level: T10 Events: blood not aspirated, injection not painful, no injection resistance, no paresthesia and negative IV test  Additional Notes 1st attempt Pt. Evaluated and documentation done after procedure finished. Patient identified. Risks/Benefits/Options discussed with patient including but not limited to bleeding, infection, nerve damage, paralysis, failed block, incomplete pain control, headache, blood pressure changes, nausea, vomiting, reactions to medication both or allergic, itching and postpartum back pain. Confirmed with bedside nurse the patient's most recent platelet count. Confirmed with patient that they are not currently taking any anticoagulation, have any bleeding history or any family history of bleeding disorders. Patient expressed understanding and wished to proceed. All questions were answered. Sterile technique was used throughout the entire procedure. Please see nursing notes for vital signs. Test dose was given through epidural catheter and negative prior to continuing to dose epidural or start infusion. Warning signs of  high block given to the patient including shortness of breath, tingling/numbness in hands, complete motor block, or any concerning symptoms with instructions to call for help. Patient was given instructions on fall risk and not to get out of bed. All questions and concerns addressed with instructions to call with any issues or inadequate analgesia.   Patient tolerated the insertion well without immediate complications.Reason for block:procedure for pain

## 2020-05-11 NOTE — Progress Notes (Signed)
Obstetric Postpartum Daily Progress Note  Chief Complaint: Postpartum care after vaginal delivery  Subjective:  30 y.o. [id number] postpartum day #0 status post vaginal delivery.  She is ambulating, is tolerating po, is voiding spontaneously.  Her pain is well controlled on PO pain medications. Her lochia is less than menses.   Medications SCHEDULED MEDICATIONS  . ibuprofen  600 mg Oral Q6H  . prenatal multivitamin  1 tablet Oral Q1200  . [START ON 05/12/2020] senna-docusate  2 tablet Oral Daily    MEDICATION INFUSIONS    PRN MEDICATIONS  acetaminophen, benzocaine-Menthol, coconut oil, witch hazel-glycerin **AND** dibucaine, diphenhydrAMINE, ondansetron **OR** ondansetron (ZOFRAN) IV, simethicone    Objective:   Vitals:   05/11/20 0132 05/11/20 0327 05/11/20 0700 05/11/20 1100  BP: (!) 105/45 107/62 111/76 113/67  Pulse: 88 90 96 87  Resp:   15 16  Temp:  98 F (36.7 C) 98.7 F (37.1 C) 98 F (36.7 C)  TempSrc:  Oral Oral Oral  SpO2:   100% 99%  Weight:      Height:        Current Vital Signs 24h Vital Sign Ranges  T 98 F (36.7 C) Temp  Avg: 98.2 F (36.8 C)  Min: 97.9 F (36.6 C)  Max: 98.7 F (37.1 C)  BP 113/67 BP  Min: 100/70  Max: 120/67  HR 87 Pulse  Avg: 101  Min: 87  Max: 114  RR 16 Resp  Avg: 17  Min: 15  Max: 20  SaO2 99 %   SpO2  Avg: 99.1 %  Min: 97 %  Max: 100 %       24 Hour I/O Current Shift I/O  Time Ins Outs 02/21 0701 - 02/22 0700 In: 458.9 [P.O.:240; I.V.:218.9] Out: 715 [Urine:125] No intake/output data recorded.  General: NAD Pulmonary: no increased work of breathing Abdomen: non-distended, non-tender, fundus firm at level of umbilicus Extremities: no edema, no erythema, no tenderness  Labs:  Recent Labs  Lab 05/10/20 2201  WBC 12.5*  HGB 10.5*  HCT 33.6*  PLT 244     Assessment:   30 y.o. [id number] postpartum day # 0 status post progressing well  Plan:   1) Acute blood loss anemia - hemodynamically stable and asymptomatic - po  ferrous sulfate  2) B POS /  Information for the patient's newborn:  Alfredo, [place] Girl Thalya [[id number]]   / Rubella <0.90 (08/04 1147)/ Varicella Immune - MMR prior to discharge  3) TDAP status received 03/15/2020, declined flu vaccine  4) breast and formula feeding /Contraception = Nexplanon  5) Disposition: Home PPD#1-2  Prentice Docker, MD 05/11/2020 12:20 PM

## 2020-05-12 LAB — CBC
HCT: 32.7 % — ABNORMAL LOW (ref 36.0–46.0)
Hemoglobin: 10.4 g/dL — ABNORMAL LOW (ref 12.0–15.0)
MCH: 22.3 pg — ABNORMAL LOW (ref 26.0–34.0)
MCHC: 31.8 g/dL (ref 30.0–36.0)
MCV: 70.2 fL — ABNORMAL LOW (ref 80.0–100.0)
Platelets: 278 10*3/uL (ref 150–400)
RBC: 4.66 MIL/uL (ref 3.87–5.11)
RDW: 23 % — ABNORMAL HIGH (ref 11.5–15.5)
WBC: 14.9 10*3/uL — ABNORMAL HIGH (ref 4.0–10.5)
nRBC: 0 % (ref 0.0–0.2)

## 2020-05-12 NOTE — Progress Notes (Signed)
Discharge order received from doctor. MMR vaccine offered at discharge. Patient refused MMR vaccine. Reviewed discharge instructions and prescriptions with patient and answered all questions. Follow up appointment instructions given. Patient verbalized understanding. ID bands checked. Patient discharged home with infant via wheelchair by nursing/auxillary.    Audree Bane, RN

## 2020-05-12 NOTE — Discharge Instructions (Signed)

## 2020-05-13 ENCOUNTER — Telehealth: Payer: Self-pay

## 2020-05-13 NOTE — Telephone Encounter (Signed)
Transition Care Management Follow-up Telephone Call  Date of discharge and from where: 05/12/2020 from [facility].   How have you been since you were released from the hospital? Pt states that she is doing well and has not questions at this time.   Any questions or concerns? No  Items Reviewed:  Did the pt receive and understand the discharge instructions provided? Yes   Medications obtained and verified? Yes   Other? No   Any new allergies since your discharge? No   Dietary orders reviewed? n/a  Do you have support at home? Yes   Functional Questionnaire: (I = Independent and D = Dependent) ADLs: I  Bathing/Dressing- I  Meal Prep- I  Eating- I  Maintaining continence- I  Transferring/Ambulation- I  Managing Meds- I   Follow up appointments reviewed:    [facility] f/u appt confirmed? No   Are transportation arrangements needed? Yes Scheduled to see Prentice Docker, MD on 05/17/2020 @ 10:30am  If their condition worsens, is the pt aware to call PCP or go to the Emergency Dept.? Yes  Was the patient provided with contact information for the PCP's office or ED? Yes  Was to pt encouraged to call back with questions or concerns? Yes

## 2020-05-17 ENCOUNTER — Encounter: Payer: Medicaid Other | Admitting: Obstetrics and Gynecology

## 2020-05-18 DIAGNOSIS — Z419 Encounter for procedure for purposes other than remedying health state, unspecified: Secondary | ICD-10-CM | POA: Diagnosis not present

## 2020-06-04 ENCOUNTER — Telehealth: Payer: Self-pay | Admitting: Advanced Practice Midwife

## 2020-06-04 NOTE — Telephone Encounter (Signed)
Patient coming in on 06/25/2020 at 9:30 for nexplanon insertion with JEG

## 2020-06-07 NOTE — Telephone Encounter (Signed)
Noted. Will order to arrive by apt date/time. 

## 2020-06-18 DIAGNOSIS — Z419 Encounter for procedure for purposes other than remedying health state, unspecified: Secondary | ICD-10-CM | POA: Diagnosis not present

## 2020-06-25 ENCOUNTER — Ambulatory Visit (INDEPENDENT_AMBULATORY_CARE_PROVIDER_SITE_OTHER): Payer: Medicaid Other | Admitting: Advanced Practice Midwife

## 2020-06-25 ENCOUNTER — Encounter: Payer: Self-pay | Admitting: Advanced Practice Midwife

## 2020-06-25 ENCOUNTER — Other Ambulatory Visit: Payer: Self-pay

## 2020-06-25 DIAGNOSIS — Z30017 Encounter for initial prescription of implantable subdermal contraceptive: Secondary | ICD-10-CM | POA: Diagnosis not present

## 2020-06-25 NOTE — Progress Notes (Signed)
Postpartum Visit  Chief Complaint:  Chief Complaint  Patient presents with  . Postpartum Care    History of Present Illness: Patient is a 30 y.o. [id number] presents for postpartum visit.  Review the Delivery Report for details.  Date of delivery: 05/11/2020 Type of delivery: Vaginal delivery - Vacuum or forceps assisted  no Episiotomy No.  Laceration: no  Pregnancy or labor problems:  Had surgical excision of right breast fibroadenoma early in pregnancy Any problems since the delivery:  no  Newborn Details:  SINGLETON :  1. BabyGender female. Birth weight: 6 pounds 12 ounces Maternal Details:  Breast or formula feeding: breastfeeding Intercourse: No  Contraception after delivery: Nexplanon Any bowel or bladder issues: No  Post partum depression/anxiety noted:  no Edinburgh Post-Partum Depression Score: 1 Date of last PAP: 2021  no abnormalities   Review of Systems: Review of Systems  Constitutional: Negative for chills and fever.  HENT: Negative for congestion, ear discharge, ear pain, hearing loss, sinus pain and sore throat.   Eyes: Negative for blurred vision and double vision.  Respiratory: Negative for cough, shortness of breath and wheezing.   Cardiovascular: Negative for chest pain, palpitations and leg swelling.  Gastrointestinal: Negative for abdominal pain, blood in stool, constipation, diarrhea, heartburn, melena, nausea and vomiting.  Genitourinary: Negative for dysuria, flank pain, frequency, hematuria and urgency.  Musculoskeletal: Negative for back pain, joint pain and myalgias.  Skin: Negative for itching and rash.  Neurological: Negative for dizziness, tingling, tremors, sensory change, speech change, focal weakness, seizures, loss of consciousness, weakness and headaches.  Endo/Heme/Allergies: Negative for environmental allergies. Does not bruise/bleed easily.  Psychiatric/Behavioral: Negative for depression, hallucinations, memory loss, substance abuse  and suicidal ideas. The patient is not nervous/anxious and does not have insomnia.     Past Medical History:  Past Medical History:  Diagnosis Date  . Anemia   . Asthma   . Breast mass, right   . Supervision of normal first pregnancy, antepartum 10/26/2017   [facility] Prenatal Labs Dating LMP =9wk [place] Blood type: B/Positive/-- (08/09 1604)  Genetic Screen   NIPS: normal XX Antibody:Negative (08/09 1604) Anatomic [place]  complete Rubella: <0.90 (08/09 1604) Varicella: Immune GTT  Third trimester: 84 RPR: Non Reactive (08/09 1604)  Rhogam  not needed HBsAg: Negative (08/09 1604)  TDaP vaccine    Declines                    Flu Shot:12/28/17 HIV: Non Re    Past Surgical History:  Past Surgical History:  Procedure Laterality Date  . BREAST CYST EXCISION Right 01/16/2020   Procedure: CYST EXCISION BREAST, breast mass;  Surgeon: Fredirick Maudlin, MD;  Location: [facility] ORS;  Service: General;  Laterality: Right;  . denies    . NO PAST SURGERIES      Family History:  Family History  Problem Relation Age of Onset  . Hypertension Mother   . Cancer Mother   . Cancer Maternal Grandmother     Social History:  Social History   Socioeconomic History  . Marital status: Single    Spouse name: Not on file  . Number of children: Not on file  . Years of education: Not on file  . Highest education level: Not on file  Occupational History  . Not on file  Tobacco Use  . Smoking status: Current Every Day Smoker    Packs/day: 0.25    Years: 2.00    Pack years: 0.50  Types: Cigarettes  . Smokeless tobacco: Never Used  Vaping Use  . Vaping Use: Never used  Substance and Sexual Activity  . Alcohol use: Not Currently  . Drug use: Not Currently    Types: Barbituates  . Sexual activity: Not Currently    Birth control/protection: None, Implant    Comment: nexplanon  Other Topics Concern  . Not on file  Social History Narrative  . Not on file   Social Determinants of Health   Financial  Resource Strain: Not on file  Food Insecurity: Not on file  Transportation Needs: Not on file  Physical Activity: Not on file  Stress: Not on file  Social Connections: Not on file  Intimate Partner Violence: Not on file    Allergies:  No Known Allergies  Medications: Prior to Admission medications   Medication Sig Start Date End Date Taking? Authorizing Provider  Prenatal Vit-Fe Fumarate-FA (PRENATAL VITAMINS) 28-0.8 MG TABS Take 1 tablet by mouth daily. Patient not taking: Reported on 06/25/2020    [provider]    Physical Exam Blood pressure 100/70, height 5\' 3"  (1.6 m), weight 139 lb (63 kg), currently breastfeeding.    General: NAD HEENT: normocephalic, anicteric Pulmonary: No increased work of breathing Abdomen: NABS, soft, non-tender, non-distended.  Umbilicus without lesions.  No hepatomegaly, splenomegaly or masses palpable. No evidence of hernia. Genitourinary:  External: Normal external female genitalia.  Normal urethral meatus, normal Bartholin's and Skene's glands.    Vagina: Normal vaginal mucosa, no evidence of prolapse.    Cervix: Grossly normal in appearance, no bleeding, no CMT  Uterus: Non-enlarged, mobile, normal contour.    Adnexa: ovaries non-enlarged, no adnexal masses  Rectal: deferred Extremities: no edema, erythema, or tenderness Neurologic: Grossly intact Psychiatric: mood appropriate, affect full   Assessment: 30 y.o. [id number] presenting for 6 week postpartum visit  Plan: Problem List Items Addressed This Visit   None   Visit Diagnoses    6 weeks postpartum follow-up    -  Primary   Nexplanon insertion       Encounter for initial prescription of implantable subdermal contraceptive           1) Contraception - Education given regarding options for contraception, as well as compatibility with breast feeding if applicable.  Patient plans on Nexplanon for contraception.  2)  Pap - ASCCP guidelines and rationale discussed.  ASCCP  guidelines and rational discussed.  Patient opts for every 3 years screening interval. Due in 2024  3) Patient underwent screening for postpartum depression with no signs of depression  4) Return in about 1 year (around 06/25/2021) for annual established gyn.   Rod Can, CNM [facility] 06/25/2020, 10:04 AM       GYNECOLOGY PROCEDURE NOTE  Patient is a 30 y.o. [id number] presenting for Nexplanon insertion as her desired means of contraception.  She provided informed consent, signed copy in the chart, time out was performed. Self reported LMP of No LMP recorded. Postpartum abstinence.  She understands that Nexplanon is a progesterone only therapy, and that patients often have irregular and unpredictable vaginal bleeding or amenorrhea. She understands that other side effects are possible related to systemic progesterone, including but not limited to, headaches, breast tenderness, nausea, and irritability. While effective at preventing pregnancy long acting reversible contraceptives do not prevent transmission of sexually transmitted diseases and use of barrier methods for this purpose was discussed. The placement procedure for Nexplanon was reviewed with the patient in detail including risks  of nerve injury, infection, bleeding and injury to other muscles or tendons. She understands that the Nexplanon implant is good for 3 years and needs to be removed at the end of that time.  She understands that Nexplanon is an extremely effective option for contraception, with failure rate of <1%. This information is reviewed today and all questions were answered. Informed consent was obtained, both verbally and written.   The patient is healthy and has no contraindications to Nexplanon use.   Procedure Appropriate time out taken.  Patient placed in dorsal supine with left arm above head, elbow flexed at 90 degrees, arm resting on examination table.  The bicipital groove was  palpated and site 8-10cm proximal to the medial epicondyle was indentified . The insertion site was prepped with two betadine swabs and then injected with 2 ml of 1% lidocaine without epinephrine.  Nexplanon removed form sterile blister packaging,  Device confirmed in needle, before inserting full length of needle, tenting up the skin as the needle was advanced.  The drug eluding rod was then deployed by pulling back the slider per the manufactures recommendation.  The implant was palpable by the clinician as well as the patient.  The insertion site covered dressed with a steri strip and band aid before applying  a kerlex bandage pressure dressing..Minimal blood loss was noted during the procedure.  The patient tolerated the procedure well.   She was instructed to wear the bandage for 24 hours, call with any signs of infection.  She was given the Nexplanon card and instructed to have the rod removed in 3 years.   Christean Leaf, CNM [facility] 06/25/20, 10:04 AM    Charge [phone] for nexplanon device, CPT [phone] for procedure J2001 for lidocaine administration Modifer 25, plus Modifer 79 is done during a global billing visit

## 2020-07-18 DIAGNOSIS — Z419 Encounter for procedure for purposes other than remedying health state, unspecified: Secondary | ICD-10-CM | POA: Diagnosis not present

## 2020-08-18 DIAGNOSIS — Z419 Encounter for procedure for purposes other than remedying health state, unspecified: Secondary | ICD-10-CM | POA: Diagnosis not present

## 2020-09-17 DIAGNOSIS — Z419 Encounter for procedure for purposes other than remedying health state, unspecified: Secondary | ICD-10-CM | POA: Diagnosis not present

## 2020-10-18 DIAGNOSIS — Z419 Encounter for procedure for purposes other than remedying health state, unspecified: Secondary | ICD-10-CM | POA: Diagnosis not present

## 2020-11-18 DIAGNOSIS — Z419 Encounter for procedure for purposes other than remedying health state, unspecified: Secondary | ICD-10-CM | POA: Diagnosis not present

## 2020-12-07 ENCOUNTER — Encounter: Payer: Self-pay | Admitting: General Surgery

## 2020-12-18 DIAGNOSIS — Z419 Encounter for procedure for purposes other than remedying health state, unspecified: Secondary | ICD-10-CM | POA: Diagnosis not present

## 2021-01-10 NOTE — Telephone Encounter (Signed)
Nexplanon rcvd/charged 06/25/20

## 2021-01-11 NOTE — Telephone Encounter (Signed)
Apt r/s to 07/25/19. See separate telephone encounter.

## 2021-01-11 NOTE — Telephone Encounter (Deleted)
Nexplanon rcvd/charged 07/22/2019

## 2021-01-18 DIAGNOSIS — Z419 Encounter for procedure for purposes other than remedying health state, unspecified: Secondary | ICD-10-CM | POA: Diagnosis not present

## 2021-02-17 DIAGNOSIS — Z419 Encounter for procedure for purposes other than remedying health state, unspecified: Secondary | ICD-10-CM | POA: Diagnosis not present

## 2021-03-11 DIAGNOSIS — H6692 Otitis media, unspecified, left ear: Secondary | ICD-10-CM | POA: Diagnosis not present

## 2021-03-11 DIAGNOSIS — Z3483 Encounter for supervision of other normal pregnancy, third trimester: Secondary | ICD-10-CM

## 2021-03-11 DIAGNOSIS — Z20822 Contact with and (suspected) exposure to covid-19: Secondary | ICD-10-CM | POA: Diagnosis not present

## 2021-03-12 ENCOUNTER — Telehealth: Payer: Self-pay

## 2021-03-12 DIAGNOSIS — Z9189 Other specified personal risk factors, not elsewhere classified: Secondary | ICD-10-CM

## 2021-03-12 NOTE — Addendum Note (Signed)
Addended by: Edgar Frisk on: 03/12/2021 10:59 AM   Modules accepted: Orders

## 2021-03-12 NOTE — Telephone Encounter (Signed)
Transition Care Management Follow-up Telephone Call Date of discharge and from where: 03/11/2021 How have you been since you were released from the hospital? Patient stated she is doing fine and has picked up her medication.  Any questions or concerns? No  Items Reviewed: Did the pt receive and understand the discharge instructions provided? Yes  Medications obtained and verified? Yes  Other? No  Any new allergies since your discharge? No  Dietary orders reviewed? No Do you have support at home? Yes   Home Care and Equipment/Supplies: Were home health services ordered? not applicable If so, what is the name of the agency? N/A  Has the agency set up a time to come to the patient's home? not applicable Were any new equipment or medical supplies ordered?  No What is the name of the medical supply agency? N/A Were you able to get the supplies/equipment? not applicable Do you have any questions related to the use of the equipment or supplies? No  Functional Questionnaire: (I = Independent and D = Dependent) ADLs: I  Bathing/Dressing- I  Meal Prep- I  Eating- I  Maintaining continence- I  Transferring/Ambulation- I  Managing Meds- I  Follow up appointments reviewed:  PCP Hospital f/u appt confirmed? No   Specialist Hospital f/u appt confirmed? No   Are transportation arrangements needed? No  If their condition worsens, is the pt aware to call PCP or go to the Emergency Dept.? Yes Was the patient provided with contact information for the PCP's office or ED? Yes Was to pt encouraged to call back with questions or concerns? Yes

## 2021-03-20 DIAGNOSIS — Z419 Encounter for procedure for purposes other than remedying health state, unspecified: Secondary | ICD-10-CM | POA: Diagnosis not present

## 2021-04-07 ENCOUNTER — Telehealth: Payer: Self-pay | Admitting: *Deleted

## 2021-04-07 NOTE — Telephone Encounter (Signed)
° °  Telephone encounter was:  Successful.  04/07/2021 Name: Crystal Randall MRN: [id number] DOB: [birth year]  Crystal Randall is a 31 y.o. year old female who is a primary care patient of Patient, No Pcp Per (Inactive) . The community resource team was consulted for assistance with PCP that takes [facility]   Care guide performed the following interventions: Patient provided with information about care guide support team and interviewed to confirm resource needs Follow up call placed to community resources to determine status of patients referral.PCP information provided but patient has a provider at this time a Dr Velva Harman is her PCP   Follow Up Plan:  No further follow up planned at this time. The patient has been provided with needed resources.  Estelle, Care Management  [phone] [address] , [place] [address] Email : [email or web address]

## 2021-04-20 DIAGNOSIS — Z419 Encounter for procedure for purposes other than remedying health state, unspecified: Secondary | ICD-10-CM | POA: Diagnosis not present

## 2021-05-18 DIAGNOSIS — Z419 Encounter for procedure for purposes other than remedying health state, unspecified: Secondary | ICD-10-CM | POA: Diagnosis not present

## 2021-06-18 DIAGNOSIS — Z419 Encounter for procedure for purposes other than remedying health state, unspecified: Secondary | ICD-10-CM | POA: Diagnosis not present

## 2021-07-18 DIAGNOSIS — Z419 Encounter for procedure for purposes other than remedying health state, unspecified: Secondary | ICD-10-CM | POA: Diagnosis not present

## 2021-08-18 DIAGNOSIS — Z419 Encounter for procedure for purposes other than remedying health state, unspecified: Secondary | ICD-10-CM | POA: Diagnosis not present

## 2021-09-17 DIAGNOSIS — Z419 Encounter for procedure for purposes other than remedying health state, unspecified: Secondary | ICD-10-CM | POA: Diagnosis not present

## 2021-10-18 DIAGNOSIS — Z419 Encounter for procedure for purposes other than remedying health state, unspecified: Secondary | ICD-10-CM | POA: Diagnosis not present

## 2021-11-08 ENCOUNTER — Other Ambulatory Visit: Payer: Self-pay

## 2021-11-08 ENCOUNTER — Encounter: Payer: Self-pay | Admitting: Emergency Medicine

## 2021-11-08 ENCOUNTER — Emergency Department
Admission: EM | Admit: 2021-11-08 | Discharge: 2021-11-08 | Disposition: A | Discharge status: Home / Self Care | Payer: Medicaid Other | Attending: Emergency Medicine | Admitting: Emergency Medicine

## 2021-11-08 DIAGNOSIS — R062 Wheezing: Secondary | ICD-10-CM | POA: Diagnosis present

## 2021-11-08 DIAGNOSIS — J4521 Mild intermittent asthma with (acute) exacerbation: Secondary | ICD-10-CM | POA: Diagnosis not present

## 2021-11-08 MED ORDER — DEXAMETHASONE SODIUM PHOSPHATE 10 MG/ML IJ SOLN
10.0000 mg | Freq: Once | INTRAMUSCULAR | Status: AC
  Administered 2021-11-08: 10 mg via INTRAMUSCULAR
  Filled 2021-11-08: qty 1

## 2021-11-08 MED ORDER — ALBUTEROL SULFATE HFA 108 (90 BASE) MCG/ACT IN AERS: 2.0000 | INHALATION_SPRAY | RESPIRATORY_TRACT | 2 refills | Status: DC | PRN

## 2021-11-08 MED ORDER — PREDNISONE 50 MG PO TABS: 50.0000 mg | ORAL_TABLET | Freq: Every day | ORAL | 0 refills | Status: DC

## 2021-11-08 MED ORDER — ALBUTEROL SULFATE (2.5 MG/3ML) 0.083% IN NEBU
2.5000 mg | INHALATION_SOLUTION | Freq: Once | RESPIRATORY_TRACT | Status: AC
Start: 2021-11-08 — End: 2021-11-08
  Administered 2021-11-08: 2.5 mg via RESPIRATORY_TRACT
  Filled 2021-11-08: qty 3

## 2021-11-08 NOTE — ED Provider Notes (Signed)
[facility] Provider Note  Patient Contact: 6:19 PM (approximate)   History   Asthma   HPI  Crystal Randall is a 31 y.o. female who presents to the ED for asthma exacerbation.  Patient has a history of asthma, states that she believes that the hot weather started to trigger a little bit of an asthma exacerbation.  She is felt a little tight, felt like she has been wheezing slightly.  No fevers, chills, nasal congestion, sore throat.  No cough.  No chest pain.  Patient states that normally she can manage her symptoms at home with her albuterol but she is out of her inhaler at this time.     Physical Exam   Triage Vital Signs: ED Triage Vitals [11/08/21 1741]  Enc Vitals Group     BP      Pulse      Resp      Temp      Temp src      SpO2      Weight 138 lb 14.2 oz (63 kg)     Height '5\' 3"'$  (1.6 m)     Head Circumference      Peak Flow      Pain Score 0     Pain Loc      Pain Edu?      Excl. in [place]?     Most recent vital signs: Vitals:   11/08/21 1831  Pulse: 88  Resp: 20  Temp: 98.2 F (36.8 C)  SpO2: 100%     General: Alert and in no acute distress. ENT:      Ears:       Nose: No congestion/rhinnorhea.      Mouth/Throat: Mucous membranes are moist. Neck: No stridor. No cervical spine tenderness to palpation.  Cardiovascular:  Good peripheral perfusion Respiratory: Normal respiratory effort without tachypnea or retractions. Lungs with faint expiratory wheezing in the bilateral lower lung fields.  No expiratory wheezing.  Good air entry to the bases with no decreased or absent breath sounds. Musculoskeletal: Full range of motion to all extremities.  Neurologic:  No gross focal neurologic deficits are appreciated.  Skin:   No rash noted Other:   ED Results / Procedures / Treatments   Labs (all labs ordered are listed, but only abnormal results are displayed) Labs Reviewed - No data to display   EKG     RADIOLOGY    No  results found.  PROCEDURES:  Critical Care performed: No  Procedures   MEDICATIONS ORDERED IN ED: Medications  albuterol (PROVENTIL) (2.5 MG/3ML) 0.083% nebulizer solution 2.5 mg (2.5 mg Nebulization Given 11/08/21 1832)  dexamethasone (DECADRON) injection 10 mg (10 mg Intramuscular Given 11/08/21 1832)     IMPRESSION / MDM / ASSESSMENT AND PLAN / ED COURSE  I reviewed the triage vital signs and the nursing notes.                              Differential diagnosis includes, but is not limited to, asthma exacerbation, viral illness, COVID  Patient's presentation is most consistent with acute presentation with potential threat to life or bodily function.   Patient's diagnosis is consistent with asthma exacerbation.  Patient presented to the ED with complaints of asthma.  Patient has a history of same, states that she ran out of her inhaler and has had some wheezing.  Overall exam is reassuring assuring.  At this  time do not feel that patient needs labs or imaging.  Patient received dexamethasone injection as well as albuterol and feels much improved.  we will prescribe the patient prednisone and albuterol inhaler.  Return precautions discussed with the patient.  Follow-up primary care as needed..  Patient is given ED precautions to return to the ED for any worsening or new symptoms.        FINAL CLINICAL IMPRESSION(S) / ED DIAGNOSES   Final diagnoses:  Mild intermittent asthma with exacerbation     Rx / DC Orders   ED Discharge Orders          Ordered    predniSONE (DELTASONE) 50 MG tablet  Daily with breakfast        11/08/21 1856    albuterol (VENTOLIN HFA) 108 (90 Base) MCG/ACT inhaler  Every 4 hours PRN        11/08/21 1856             Note:  This document was prepared using Dragon voice recognition software and may include unintentional dictation errors.   Brynda Peon 11/08/21 1857    Blake Divine, MD 11/08/21 2223

## 2021-11-08 NOTE — ED Triage Notes (Signed)
C/o asthma bothering her.  Onset over the weekend.  States out of inhalers.   AAOx3.  Skin warm and dry.  NAD

## 2021-11-08 NOTE — ED Notes (Signed)
See triage note  States her asthma has been bothering her over the weekend  Ran out of her inhaler  Has had some chest tightness   now fever

## 2021-11-08 NOTE — ED Provider Triage Note (Addendum)
  Emergency Medicine Provider Triage Evaluation Note  Crystal Randall , a 31 y.o.female,  was evaluated in triage.  Pt complains of suspected asthma exacerbation.  Patient states that she has been having increasing shortness of breath recently.  Reports it feels similar to her usual asthma.  She states that she ran out of her albuterol inhaler.  Denies any other symptoms.   Review of Systems  Positive: Shortness of breath Negative: Denies fever, chest pain, vomiting  Physical Exam  There were no vitals filed for this visit. Gen:   Awake, no distress   Resp:  Normal effort.  Very mild wheezing appreciated in the bases.  MSK:   Moves extremities without difficulty  Other:    Medical Decision Making  Given the patient's initial medical screening exam, the following diagnostic evaluation has been ordered. The patient will be placed in the appropriate treatment space, once one is available, to complete the evaluation and treatment. I have discussed the plan of care with the patient and I have advised the patient that an ED physician or mid-level practitioner will reevaluate their condition after the test results have been received, as the results may give them additional insight into the type of treatment they may need.    Diagnostics: None immediately.  Treatments: none immediately   Teodoro Spray, PA 11/08/21 1805    Teodoro Spray, [place] 11/08/21 1806

## 2021-11-18 DIAGNOSIS — Z419 Encounter for procedure for purposes other than remedying health state, unspecified: Secondary | ICD-10-CM | POA: Diagnosis not present

## 2021-12-18 DIAGNOSIS — Z419 Encounter for procedure for purposes other than remedying health state, unspecified: Secondary | ICD-10-CM | POA: Diagnosis not present

## 2022-01-18 DIAGNOSIS — Z419 Encounter for procedure for purposes other than remedying health state, unspecified: Secondary | ICD-10-CM | POA: Diagnosis not present

## 2022-02-17 DIAGNOSIS — Z419 Encounter for procedure for purposes other than remedying health state, unspecified: Secondary | ICD-10-CM | POA: Diagnosis not present

## 2022-03-20 DIAGNOSIS — Z419 Encounter for procedure for purposes other than remedying health state, unspecified: Secondary | ICD-10-CM | POA: Diagnosis not present

## 2022-04-20 DIAGNOSIS — Z419 Encounter for procedure for purposes other than remedying health state, unspecified: Secondary | ICD-10-CM | POA: Diagnosis not present

## 2022-05-19 DIAGNOSIS — Z419 Encounter for procedure for purposes other than remedying health state, unspecified: Secondary | ICD-10-CM | POA: Diagnosis not present

## 2022-06-19 DIAGNOSIS — Z419 Encounter for procedure for purposes other than remedying health state, unspecified: Secondary | ICD-10-CM | POA: Diagnosis not present

## 2022-07-19 DIAGNOSIS — Z419 Encounter for procedure for purposes other than remedying health state, unspecified: Secondary | ICD-10-CM | POA: Diagnosis not present

## 2022-08-19 DIAGNOSIS — Z419 Encounter for procedure for purposes other than remedying health state, unspecified: Secondary | ICD-10-CM | POA: Diagnosis not present

## 2022-09-18 DIAGNOSIS — Z419 Encounter for procedure for purposes other than remedying health state, unspecified: Secondary | ICD-10-CM | POA: Diagnosis not present

## 2022-09-27 ENCOUNTER — Encounter: Payer: Self-pay | Admitting: Oncology

## 2022-09-27 ENCOUNTER — Telehealth: Payer: Self-pay

## 2022-09-27 NOTE — Telephone Encounter (Signed)
Chart review completed for patient. Patient is due for cervical cancer screen. Patient does not have PCP listed. Mychart message sent to patient to inquire about scheduling.  Crystal Randall, Population Health Specialist.

## 2022-10-19 DIAGNOSIS — Z419 Encounter for procedure for purposes other than remedying health state, unspecified: Secondary | ICD-10-CM | POA: Diagnosis not present

## 2022-11-07 ENCOUNTER — Ambulatory Visit (INDEPENDENT_AMBULATORY_CARE_PROVIDER_SITE_OTHER): Payer: Medicaid Other | Admitting: Nurse Practitioner

## 2022-11-07 ENCOUNTER — Other Ambulatory Visit (HOSPITAL_COMMUNITY): Admission: RE | Admit: 2022-11-07 | Payer: Medicaid Other | Source: Ambulatory Visit

## 2022-11-07 ENCOUNTER — Encounter: Payer: Self-pay | Admitting: Nurse Practitioner

## 2022-11-07 VITALS — BP 118/80 | HR 81 | Temp 98.1°F | Ht 63.0 in | Wt 178.8 lb

## 2022-11-07 DIAGNOSIS — Z1159 Encounter for screening for other viral diseases: Secondary | ICD-10-CM

## 2022-11-07 DIAGNOSIS — Z124 Encounter for screening for malignant neoplasm of cervix: Secondary | ICD-10-CM

## 2022-11-07 DIAGNOSIS — Z0000 Encounter for general adult medical examination without abnormal findings: Secondary | ICD-10-CM

## 2022-11-07 DIAGNOSIS — Z13220 Encounter for screening for lipoid disorders: Secondary | ICD-10-CM | POA: Diagnosis not present

## 2022-11-07 DIAGNOSIS — Z131 Encounter for screening for diabetes mellitus: Secondary | ICD-10-CM

## 2022-11-07 DIAGNOSIS — Z1329 Encounter for screening for other suspected endocrine disorder: Secondary | ICD-10-CM

## 2022-11-07 DIAGNOSIS — Z01419 Encounter for gynecological examination (general) (routine) without abnormal findings: Secondary | ICD-10-CM

## 2022-11-07 HISTORY — DX: Encounter for gynecological examination (general) (routine) without abnormal findings: Z01.419

## 2022-11-07 LAB — CBC WITH DIFFERENTIAL/PLATELET
Basophils Absolute: 0 10*3/uL (ref 0.0–0.1)
Basophils Relative: 0.4 % (ref 0.0–3.0)
Eosinophils Absolute: 0 10*3/uL (ref 0.0–0.7)
Eosinophils Relative: 0.6 % (ref 0.0–5.0)
HCT: 43.1 % (ref 36.0–46.0)
Hemoglobin: 13.2 g/dL (ref 12.0–15.0)
Lymphocytes Relative: 53 % — ABNORMAL HIGH (ref 12.0–46.0)
Lymphs Abs: 2.3 10*3/uL (ref 0.7–4.0)
MCHC: 30.6 g/dL (ref 30.0–36.0)
MCV: 73.9 fl — ABNORMAL LOW (ref 78.0–100.0)
Monocytes Absolute: 0.6 10*3/uL (ref 0.1–1.0)
Monocytes Relative: 13.4 % — ABNORMAL HIGH (ref 3.0–12.0)
Neutro Abs: 1.4 10*3/uL (ref 1.4–7.7)
Neutrophils Relative %: 32.6 % — ABNORMAL LOW (ref 43.0–77.0)
Platelets: 278 10*3/uL (ref 150.0–400.0)
RBC: 5.84 Mil/uL — ABNORMAL HIGH (ref 3.87–5.11)
RDW: 15 % (ref 11.5–15.5)
WBC: 4.3 10*3/uL (ref 4.0–10.5)

## 2022-11-07 LAB — COMPREHENSIVE METABOLIC PANEL
ALT: 20 U/L (ref 0–35)
AST: 22 U/L (ref 0–37)
Albumin: 4.3 g/dL (ref 3.5–5.2)
Alkaline Phosphatase: 71 U/L (ref 39–117)
BUN: 6 mg/dL (ref 6–23)
CO2: 27 mEq/L (ref 19–32)
Calcium: 9.6 mg/dL (ref 8.4–10.5)
Chloride: 105 mEq/L (ref 96–112)
Creatinine, Ser: 0.79 mg/dL (ref 0.40–1.20)
GFR: 100.71 mL/min (ref >60.00)
Glucose, Bld: 91 mg/dL (ref 70–99)
Potassium: 4.5 mEq/L (ref 3.5–5.1)
Sodium: 140 mEq/L (ref 135–145)
Total Bilirubin: 0.3 mg/dL (ref 0.2–1.2)
Total Protein: 7.3 g/dL (ref 6.0–8.3)

## 2022-11-07 LAB — LIPID PANEL
Cholesterol: 174 mg/dL (ref 0–200)
HDL: 28.6 mg/dL — ABNORMAL LOW (ref >39.00)
LDL Cholesterol: 125 mg/dL — ABNORMAL HIGH (ref 0–99)
NonHDL: 145.06
Total CHOL/HDL Ratio: 6
Triglycerides: 98 mg/dL (ref 0.0–149.0)
VLDL: 19.6 mg/dL (ref 0.0–40.0)

## 2022-11-07 LAB — TSH: TSH: 1.57 u[IU]/mL (ref 0.35–5.50)

## 2022-11-07 LAB — VITAMIN D 25 HYDROXY (VIT D DEFICIENCY, FRACTURES): VITD: 18.53 ng/mL — ABNORMAL LOW (ref 30.00–100.00)

## 2022-11-07 LAB — HEMOGLOBIN A1C: Hgb A1c MFr Bld: 5.6 % (ref 4.6–6.5)

## 2022-11-07 NOTE — Progress Notes (Signed)
Bethanie Dicker, NP-C Phone: [phone]  Crystal Randall is a 32 y.o. female who presents today to establish care and for annual exam. She has no complaints or new concerns today. She does not have a significant past medical history. She is not on any daily medications.   Diet: Varies- increased protein, does eat a lot of fast food. Exercise: Has personal trainer, goes to gym 4-5 times per week, cardio and strength training Pap smear: 04/04/2019- Due! Family history-  Colon cancer: No  Breast cancer: Yes, maternal grandmother  Ovarian cancer: No Vaccines-   Flu: Not due  Tetanus: 03/15/2020  COVID19: Never HIV screening: Negative Hep C Screening: Today Tobacco use: Yes, approx 1 pack per day, interested in quitting Alcohol use: Yes, socially- 2 times per month Illicit Drug use: No Dentist: Yes Ophthalmology: Yes  [id number] Wt Readings from Last 3 Encounters:  11/07/22 178 lb 12.8 oz (81.1 kg)  11/08/21 138 lb 14.2 oz (63 kg)  06/25/20 139 lb (63 kg)   Last period: 10/16/2022 Regular periods: No- has Nexplanon Heavy bleeding: No  Sexually active: Yes Birth control or hormonal therapy: Yes- Nexplanon Hx of STD: Patient desires STD screening Dyspareunia: No Hot flashes: No Vaginal discharge: Yes Dysuria: No   Breast mass or concerns: No History of abnormal pap: No   Active Ambulatory Problems    Diagnosis Date Noted   Atypical squamous cells cannot exclude high grade squamous intraepithelial lesion on cytologic smear of cervix (ASC-H) 09/24/2018   Anemia of pregnancy 03/01/2020   Well woman exam with routine gynecological exam 11/07/2022   Resolved Ambulatory Problems    Diagnosis Date Noted   Supervision of normal first pregnancy, antepartum 10/26/2017   Vulvovaginal candidiasis 11/28/2017   Vaginal bleeding in pregnancy, second trimester 01/20/2018   Indication for care/intervention related to labor/delivery, antepartum 05/25/2018   Labor and delivery, indication  for care 05/25/2018   Pregnancy 05/26/2018   Indication for care in labor and delivery, antepartum 05/29/2018   False labor after 37 completed weeks of gestation 05/29/2018   Normal labor 06/04/2018   Postpartum care following vaginal delivery 06/04/2018   Supervision of other normal pregnancy, antepartum 04/14/2019   Short interval between pregnancies affecting pregnancy, antepartum 04/14/2019   Encounter for supervision of other normal pregnancy, third trimester 10/24/2019   Breast mass, right    Labor and delivery, indication for care 05/10/2020   [redacted] weeks gestation of pregnancy 05/11/2020   Encounter for care or examination of lactating mother 05/11/2020   Encounter for supervision of other normal pregnancy, third trimester    Normal labor and delivery    Past Medical History:  Diagnosis Date   Anemia    Asthma     Family History  Problem Relation Age of Onset   Hypertension Mother    Cancer Mother    Cancer Maternal Grandmother     Social History   Socioeconomic History   Marital status: Single    Spouse name: Not on file   Number of children: Not on file   Years of education: Not on file   Highest education level: Not on file  Occupational History   Not on file  Tobacco Use   Smoking status: Every Day    Current packs/day: 0.25    Average packs/day: 0.3 packs/day for 2.0 years (0.5 ttl pk-yrs)    Types: Cigarettes   Smokeless tobacco: Never  Vaping Use   Vaping status: Never Used  Substance and Sexual Activity   Alcohol  use: Not Currently   Drug use: Not Currently    Types: Barbituates   Sexual activity: Yes    Birth control/protection: Implant, None    Comment: nexplanon  Other Topics Concern   Not on file  Social History Narrative   Not on file   Social Determinants of Health   Financial Resource Strain: Low Risk  (05/29/2018)   Overall Financial Resource Strain (CARDIA)    Difficulty of Paying Living Expenses: Not hard at all  Food Insecurity:  Unknown (05/29/2018)   Hunger Vital Sign    Worried About Running Out of Food in the Last Year: Patient declined    Ran Out of Food in the Last Year: Patient declined  Transportation Needs: No Transportation Needs (05/29/2018)   PRAPARE - Administrator, Civil Service (Medical): No    Lack of Transportation (Non-Medical): No  Physical Activity: Unknown (05/29/2018)   Exercise Vital Sign    Days of Exercise per Week: Patient declined    Minutes of Exercise per Session: Patient declined  Stress: No Stress Concern Present (05/29/2018)   [employer] of Occupational Health - Occupational Stress Questionnaire    Feeling of Stress : Not at all  Social Connections: Unknown (05/29/2018)   Social Connection and Isolation Panel [NHANES]    Frequency of Communication with Friends and Family: Patient declined    Frequency of Social Gatherings with Friends and Family: Patient declined    Attends Religious Services: Patient declined    Database administrator or Organizations: Patient declined    Attends Banker Meetings: Patient declined    Marital Status: Patient declined  Intimate Partner Violence: Not At Risk (05/29/2018)   Humiliation, Afraid, Rape, and Kick questionnaire    Fear of Current or Ex-Partner: No    Emotionally Abused: No    Physically Abused: No    Sexually Abused: No    ROS  General:  Negative for unexplained weight loss, fever Skin: Negative for new or changing mole, sore that won't heal HEENT: Negative for trouble hearing, trouble seeing, ringing in ears, mouth sores, hoarseness, change in voice, dysphagia. CV:  Negative for chest pain, dyspnea, edema, palpitations Resp: Negative for cough, dyspnea, hemoptysis GI: Negative for nausea, vomiting, diarrhea, constipation, abdominal pain, melena, hematochezia. GU: Negative for dysuria, incontinence, urinary hesitance, hematuria, vaginal or penile discharge, polyuria, sexual difficulty, lumps in testicle  or breasts MSK: Negative for muscle cramps or aches, joint pain or swelling Neuro: Negative for headaches, weakness, numbness, dizziness, passing out/fainting Psych: Negative for depression, anxiety, memory problems  Objective  Physical Exam Vitals:   11/07/22 0906  BP: 118/80  Pulse: 81  Temp: 98.1 F (36.7 C)  SpO2: 99%    BP Readings from Last 3 Encounters:  11/07/22 118/80  11/08/21 118/84  06/25/20 100/70   Wt Readings from Last 3 Encounters:  11/07/22 178 lb 12.8 oz (81.1 kg)  11/08/21 138 lb 14.2 oz (63 kg)  06/25/20 139 lb (63 kg)    Physical Exam Exam conducted with a chaperone present Donavan Foil, CMA).  Constitutional:      General: She is not in acute distress.    Appearance: Normal appearance.  HENT:     Head: Normocephalic.     Right Ear: Tympanic membrane normal.     Left Ear: Tympanic membrane normal.     Nose: Nose normal.     Mouth/Throat:     Mouth: Mucous membranes are moist.     Pharynx: Oropharynx  is clear.  Eyes:     Conjunctiva/sclera: Conjunctivae normal.     Pupils: Pupils are equal, round, and reactive to light.  Neck:     Thyroid: No thyromegaly.  Cardiovascular:     Rate and Rhythm: Normal rate and regular rhythm.     Heart sounds: Normal heart sounds.  Pulmonary:     Effort: Pulmonary effort is normal.     Breath sounds: Normal breath sounds.  Abdominal:     General: Abdomen is flat. Bowel sounds are normal.     Palpations: Abdomen is soft. There is no mass.     Tenderness: There is no abdominal tenderness.  Genitourinary:    General: Normal vulva.     Pubic Area: No rash.      Labia:        Right: No rash or lesion.        Left: No rash or lesion.      Vagina: Normal. No vaginal discharge or bleeding.     Cervix: Normal. No discharge, friability or cervical bleeding.     Uterus: Normal.   Musculoskeletal:        General: Normal range of motion.  Lymphadenopathy:     Cervical: No cervical adenopathy.  Skin:     General: Skin is warm and dry.     Findings: No rash.  Neurological:     General: No focal deficit present.     Mental Status: She is alert.  Psychiatric:        Mood and Affect: Mood normal.        Behavior: Behavior normal.    Assessment/Plan:   Well woman exam with routine gynecological exam Assessment & Plan: Physical exam complete. Lab work as outlined. Will contact patient with results. Pap- today, will contact with the results. Flu vaccine not due. Tetanus vaccine- UTD. Declined all COVID vaccines. HIV screening negative. Hep C screening today in lab work. Counseled on tobacco cessation, encouraged to pick a day in the next 2 weeks and quit completely. Steps to quitting and tips provided to patient. Recommended follow ups with Dentist and Ophthalmology for annual exams. Encouraged to work on [employer] and continue exercising. Return to care in one year, sooner PRN.   Orders: -     CBC with Differential/Platelet -     Comprehensive metabolic panel -     VITAMIN D 25 Hydroxy (Vit-D Deficiency, Fractures)  Screening for cervical cancer -     Cytology - PAP  Diabetes mellitus screening -     Hemoglobin A1c  Thyroid disorder screen -     TSH  Lipid screening -     Lipid panel  Encounter for hepatitis C screening test for low risk patient -     Hepatitis C antibody   Return in about 1 year (around 11/07/2023) for Annual Exam, sooner PRN.   Bethanie Dicker, NP-C [facility] - [employer]

## 2022-11-07 NOTE — Assessment & Plan Note (Addendum)
Physical exam complete. Lab work as outlined. Will contact patient with results. Pap- today, will contact with the results. Flu vaccine not due. Tetanus vaccine- UTD. Declined all COVID vaccines. HIV screening negative. Hep C screening today in lab work. Counseled on tobacco cessation, encouraged to pick a day in the next 2 weeks and quit completely. Steps to quitting and tips provided to patient. Recommended follow ups with Dentist and Ophthalmology for annual exams. Encouraged to work on [employer] and continue exercising. Return to care in one year, sooner PRN.

## 2022-11-08 LAB — HEPATITIS C ANTIBODY: Hepatitis C Ab: NONREACTIVE

## 2022-11-10 ENCOUNTER — Telehealth: Payer: Self-pay

## 2022-11-10 LAB — CYTOLOGY - PAP
Chlamydia: NEGATIVE
Comment: NEGATIVE
Comment: NEGATIVE
Comment: NEGATIVE
Comment: NORMAL
Diagnosis: NEGATIVE
High risk HPV: NEGATIVE
Neisseria Gonorrhea: NEGATIVE
Trichomonas: NEGATIVE

## 2022-11-10 NOTE — Telephone Encounter (Signed)
In trying to contact pt via mobile number [phone] I receive an error message about call can not be completed.    Called pts mother Poteat and left a VM informing her we are trying to get in contact with the pt in regards to labs

## 2022-11-15 ENCOUNTER — Telehealth: Payer: Self-pay

## 2022-11-15 NOTE — Telephone Encounter (Signed)
LMOM for pt to CB in regards to her pap results question

## 2022-11-15 NOTE — Telephone Encounter (Signed)
Spoke with pt and it was documented in lab tab

## 2022-11-15 NOTE — Telephone Encounter (Signed)
Pt returned Latavia CMA call. Transferred. 

## 2022-11-19 DIAGNOSIS — Z419 Encounter for procedure for purposes other than remedying health state, unspecified: Secondary | ICD-10-CM | POA: Diagnosis not present

## 2022-11-22 ENCOUNTER — Telehealth: Payer: Self-pay | Admitting: Nurse Practitioner

## 2022-11-22 ENCOUNTER — Other Ambulatory Visit: Payer: Self-pay | Admitting: Nurse Practitioner

## 2022-11-22 DIAGNOSIS — R718 Other abnormality of red blood cells: Secondary | ICD-10-CM

## 2022-11-22 NOTE — Telephone Encounter (Signed)
Patient need lab orders.

## 2022-11-28 ENCOUNTER — Other Ambulatory Visit (INDEPENDENT_AMBULATORY_CARE_PROVIDER_SITE_OTHER): Payer: Medicaid Other

## 2022-11-28 DIAGNOSIS — R718 Other abnormality of red blood cells: Secondary | ICD-10-CM | POA: Diagnosis not present

## 2022-11-28 LAB — CBC WITH DIFFERENTIAL/PLATELET
Basophils Absolute: 0.1 10*3/uL (ref 0.0–0.1)
Basophils Relative: 0.7 % (ref 0.0–3.0)
Eosinophils Absolute: 0.2 10*3/uL (ref 0.0–0.7)
Eosinophils Relative: 2.8 % (ref 0.0–5.0)
HCT: 39.1 % (ref 36.0–46.0)
Hemoglobin: 12.1 g/dL (ref 12.0–15.0)
Lymphocytes Relative: 37.6 % (ref 12.0–46.0)
Lymphs Abs: 2.6 10*3/uL (ref 0.7–4.0)
MCHC: 30.8 g/dL (ref 30.0–36.0)
MCV: 73.8 fl — ABNORMAL LOW (ref 78.0–100.0)
Monocytes Absolute: 0.6 10*3/uL (ref 0.1–1.0)
Monocytes Relative: 8.3 % (ref 3.0–12.0)
Neutro Abs: 3.5 10*3/uL (ref 1.4–7.7)
Neutrophils Relative %: 50.6 % (ref 43.0–77.0)
Platelets: 317 10*3/uL (ref 150.0–400.0)
RBC: 5.3 Mil/uL — ABNORMAL HIGH (ref 3.87–5.11)
RDW: 14.6 % (ref 11.5–15.5)
WBC: 6.9 10*3/uL (ref 4.0–10.5)

## 2022-11-28 LAB — IBC + FERRITIN
Ferritin: 30.3 ng/mL (ref 10.0–291.0)
Iron: 88 ug/dL (ref 42–145)
Saturation Ratios: 23.3 % (ref 20.0–50.0)
TIBC: 378 ug/dL (ref 250.0–450.0)
Transferrin: 270 mg/dL (ref 212.0–360.0)

## 2022-12-05 ENCOUNTER — Other Ambulatory Visit: Payer: Self-pay | Admitting: Nurse Practitioner

## 2022-12-05 DIAGNOSIS — E611 Iron deficiency: Secondary | ICD-10-CM

## 2022-12-05 MED ORDER — IRON (FERROUS SULFATE) 325 (65 FE) MG PO TABS: 1.0000 | ORAL_TABLET | Freq: Every day | ORAL | 1 refills | Status: DC

## 2022-12-19 DIAGNOSIS — Z419 Encounter for procedure for purposes other than remedying health state, unspecified: Secondary | ICD-10-CM | POA: Diagnosis not present

## 2023-01-19 DIAGNOSIS — Z419 Encounter for procedure for purposes other than remedying health state, unspecified: Secondary | ICD-10-CM | POA: Diagnosis not present

## 2023-02-18 DIAGNOSIS — Z419 Encounter for procedure for purposes other than remedying health state, unspecified: Secondary | ICD-10-CM | POA: Diagnosis not present

## 2023-03-08 ENCOUNTER — Telehealth: Payer: Self-pay | Admitting: *Deleted

## 2023-03-08 ENCOUNTER — Other Ambulatory Visit: Payer: Self-pay | Admitting: Nurse Practitioner

## 2023-03-08 DIAGNOSIS — E611 Iron deficiency: Secondary | ICD-10-CM

## 2023-03-08 DIAGNOSIS — R718 Other abnormality of red blood cells: Secondary | ICD-10-CM

## 2023-03-08 NOTE — Telephone Encounter (Signed)
Please place future lab orders for lab appt on Monday

## 2023-03-12 ENCOUNTER — Other Ambulatory Visit: Payer: Medicaid Other

## 2023-03-21 DIAGNOSIS — Z419 Encounter for procedure for purposes other than remedying health state, unspecified: Secondary | ICD-10-CM | POA: Diagnosis not present

## 2023-04-17 ENCOUNTER — Encounter: Payer: Self-pay | Admitting: Nurse Practitioner

## 2023-04-17 ENCOUNTER — Other Ambulatory Visit (HOSPITAL_COMMUNITY)
Admission: RE | Admit: 2023-04-17 | Discharge: 2023-04-17 | Disposition: A | Discharge status: Home / Self Care | Payer: Medicaid Other | Source: Ambulatory Visit | Attending: Nurse Practitioner | Admitting: Nurse Practitioner

## 2023-04-17 ENCOUNTER — Ambulatory Visit (INDEPENDENT_AMBULATORY_CARE_PROVIDER_SITE_OTHER): Payer: Medicaid Other | Admitting: Nurse Practitioner

## 2023-04-17 VITALS — BP 120/80 | HR 84 | Temp 97.9°F | Ht 63.0 in | Wt 176.8 lb

## 2023-04-17 DIAGNOSIS — E611 Iron deficiency: Secondary | ICD-10-CM | POA: Insufficient documentation

## 2023-04-17 DIAGNOSIS — N898 Other specified noninflammatory disorders of vagina: Secondary | ICD-10-CM | POA: Insufficient documentation

## 2023-04-17 DIAGNOSIS — R718 Other abnormality of red blood cells: Secondary | ICD-10-CM | POA: Diagnosis not present

## 2023-04-17 MED ORDER — METRONIDAZOLE 500 MG PO TABS: 500.0000 mg | ORAL_TABLET | Freq: Two times a day (BID) | ORAL | 0 refills | Status: AC

## 2023-04-17 MED ORDER — ALBUTEROL SULFATE HFA 108 (90 BASE) MCG/ACT IN AERS: 2.0000 | INHALATION_SPRAY | Freq: Four times a day (QID) | RESPIRATORY_TRACT | 2 refills | Status: DC | PRN

## 2023-04-17 NOTE — Assessment & Plan Note (Signed)
There is a history of low iron levels, and iron supplementation is ongoing. Lab work as outlined to monitor iron levels and continue the current supplementation regimen.

## 2023-04-17 NOTE — Progress Notes (Signed)
Bethanie Dicker, NP-C Phone: [phone]  Crystal Randall is a 33 y.o. female who presents today for vaginal discharge.   Discussed the use of AI scribe software for clinical note transcription with the patient, who gave verbal consent to proceed.  History of Present Illness   She presents with vaginal discharge.  She has been experiencing vaginal discharge since August 2024, which began around the time of her last Pap smear. The discharge has progressively increased in volume and is described as yellow and milky, with an associated odor. No blisters, bumps, lesions, irritation, itching, or painful intercourse are reported. There is no blood in her urine, abnormal bleeding, or burning sensation during urination. She denies any risk of sexually transmitted disease exposure.  She has a history of bacterial vaginosis in 2022, for which she was treated with vaginal medication while pregnant. She associates the use of certain soaps with irritation, which she believes may contribute to her symptoms.  She is currently using an inhaler and has been taking prescribed iron pills. She recalls missing a lab appointment in December and is due for repeat lab work.      Social History   Tobacco Use  Smoking Status Every Day   Current packs/day: 0.25   Average packs/day: 0.3 packs/day for 2.0 years (0.5 ttl pk-yrs)   Types: Cigarettes  Smokeless Tobacco Never    Current Outpatient Medications on File Prior to Visit  Medication Sig Dispense Refill   Iron, Ferrous Sulfate, 325 (65 Fe) MG TABS Take 1 tablet by mouth daily. 90 tablet 1   No current facility-administered medications on file prior to visit.    ROS see history of present illness  Objective  Physical Exam Vitals:   04/17/23 1505  BP: 120/80  Pulse: 84  Temp: 97.9 F (36.6 C)  SpO2: 98%    BP Readings from Last 3 Encounters:  04/17/23 120/80  11/07/22 118/80  11/08/21 118/84   Wt Readings from Last 3 Encounters:  04/17/23  176 lb 12.8 oz (80.2 kg)  11/07/22 178 lb 12.8 oz (81.1 kg)  11/08/21 138 lb 14.2 oz (63 kg)    Physical Exam Constitutional:      General: She is not in acute distress.    Appearance: Normal appearance.  HENT:     Head: Normocephalic.  Cardiovascular:     Rate and Rhythm: Normal rate and regular rhythm.     Heart sounds: Normal heart sounds.  Pulmonary:     Effort: Pulmonary effort is normal.     Breath sounds: Normal breath sounds.  Skin:    General: Skin is warm and dry.  Neurological:     General: No focal deficit present.     Mental Status: She is alert.  Psychiatric:        Mood and Affect: Mood normal.        Behavior: Behavior normal.    Assessment/Plan: Please see individual problem list.  Vaginal discharge Assessment & Plan: Increased yellow, milky discharge is present with odor and without pruritus, dysuria, or dyspareunia, and there is no known STI exposure. There is a history of BV in 2022 during pregnancy. A vaginal swab was obtained to confirm the diagnosis and rule out STIs. Consistent with BV. Start Flagyl 500mg  twice daily for 7 days, and advise avoiding alcohol during treatment. Will contact patient with results of swab.   Orders: -     Cervicovaginal ancillary only -     metroNIDAZOLE; Take 1 tablet (500 mg total)  by mouth 2 (two) times daily for 7 days.  Dispense: 14 tablet; Refill: 0  Iron deficiency Assessment & Plan: There is a history of low iron levels, and iron supplementation is ongoing. Lab work as outlined to monitor iron levels and continue the current supplementation regimen.  Orders: -     IBC + Ferritin  Low mean corpuscular volume (MCV) -     CBC with Differential/Platelet  Other orders -     Albuterol Sulfate HFA; Inhale 2 puffs into the lungs every 6 (six) hours as needed for wheezing or shortness of breath.  Dispense: 8 g; Refill: 2    Return if symptoms worsen or fail to improve.   Bethanie Dicker, NP-C [facility] -  [facility]

## 2023-04-17 NOTE — Assessment & Plan Note (Signed)
Increased yellow, milky discharge is present with odor and without pruritus, dysuria, or dyspareunia, and there is no known STI exposure. There is a history of BV in 2022 during pregnancy. A vaginal swab was obtained to confirm the diagnosis and rule out STIs. Consistent with BV. Start Flagyl 500mg  twice daily for 7 days, and advise avoiding alcohol during treatment. Will contact patient with results of swab.

## 2023-04-18 LAB — CBC WITH DIFFERENTIAL/PLATELET
Basophils Absolute: 0.1 10*3/uL (ref 0.0–0.1)
Basophils Relative: 1.3 % (ref 0.0–3.0)
Eosinophils Absolute: 0.2 10*3/uL (ref 0.0–0.7)
Eosinophils Relative: 3.2 % (ref 0.0–5.0)
HCT: 37.7 % (ref 36.0–46.0)
Hemoglobin: 11.9 g/dL — ABNORMAL LOW (ref 12.0–15.0)
Lymphocytes Relative: 40.2 % (ref 12.0–46.0)
Lymphs Abs: 3.1 10*3/uL (ref 0.7–4.0)
MCHC: 31.5 g/dL (ref 30.0–36.0)
MCV: 73.8 fL — ABNORMAL LOW (ref 78.0–100.0)
Monocytes Absolute: 0.7 10*3/uL (ref 0.1–1.0)
Monocytes Relative: 9.6 % (ref 3.0–12.0)
Neutro Abs: 3.5 10*3/uL (ref 1.4–7.7)
Neutrophils Relative %: 45.7 % (ref 43.0–77.0)
Platelets: 320 10*3/uL (ref 150.0–400.0)
RBC: 5.11 Mil/uL (ref 3.87–5.11)
RDW: 14.6 % (ref 11.5–15.5)
WBC: 7.7 10*3/uL (ref 4.0–10.5)

## 2023-04-18 LAB — IBC + FERRITIN
Ferritin: 17 ng/mL (ref 10.0–291.0)
Iron: 94 ug/dL (ref 42–145)
Saturation Ratios: 24.9 % (ref 20.0–50.0)
TIBC: 378 ug/dL (ref 250.0–450.0)
Transferrin: 270 mg/dL (ref 212.0–360.0)

## 2023-04-20 LAB — CERVICOVAGINAL ANCILLARY ONLY
Bacterial Vaginitis (gardnerella): POSITIVE — AB
Candida Glabrata: NEGATIVE
Candida Vaginitis: NEGATIVE
Chlamydia: NEGATIVE
Comment: NEGATIVE
Comment: NEGATIVE
Comment: NEGATIVE
Comment: NEGATIVE
Comment: NEGATIVE
Comment: NORMAL
Neisseria Gonorrhea: NEGATIVE
Trichomonas: NEGATIVE

## 2023-04-21 DIAGNOSIS — Z419 Encounter for procedure for purposes other than remedying health state, unspecified: Secondary | ICD-10-CM | POA: Diagnosis not present

## 2023-04-24 ENCOUNTER — Ambulatory Visit: Payer: Medicaid Other | Admitting: Nurse Practitioner

## 2023-04-24 ENCOUNTER — Telehealth: Payer: Self-pay

## 2023-04-24 NOTE — Telephone Encounter (Signed)
VM left to cb in regards to results

## 2023-05-09 ENCOUNTER — Ambulatory Visit: Payer: Medicaid Other | Admitting: Advanced Practice Midwife

## 2023-05-16 ENCOUNTER — Encounter: Payer: Self-pay | Admitting: Advanced Practice Midwife

## 2023-05-19 DIAGNOSIS — Z419 Encounter for procedure for purposes other than remedying health state, unspecified: Secondary | ICD-10-CM | POA: Diagnosis not present

## 2023-06-06 ENCOUNTER — Ambulatory Visit (INDEPENDENT_AMBULATORY_CARE_PROVIDER_SITE_OTHER)

## 2023-06-06 VITALS — BP 123/80 | HR 91 | Ht 63.0 in | Wt 179.0 lb

## 2023-06-06 DIAGNOSIS — Z3046 Encounter for surveillance of implantable subdermal contraceptive: Secondary | ICD-10-CM | POA: Diagnosis not present

## 2023-06-06 NOTE — Progress Notes (Signed)
   NEXPLANON REMOVAL  SUBJECTIVE Crystal Randall is a 33 y.o. [id number] who presents today for removal of her Nexplanon a. Her Nexplanon was placed about 3 years ago. She reports that this contraceptive method has worked well for her but she hasn't liked the irregular bleeding. She desires to have it removed and does not desire additional contraception at this time.   OBJECTIVE Vitals:   06/06/23 1552  BP: 123/80  Pulse: 91     Procedure Note Consent was obtained before beginning this procedure. The Nexplanon was palpated and the surrounding skin was prepped with iodine in sterile fashion. Adequate anesthesia was achieved with subdermal injection of 1% lidocaine. A skin incision was made over the distal aspect of the device. The capsule was lysed sharply and the device was removed with a hemostat. Hemostasis was achieved. The incision site was closed with with a steristrip and a pressure dressing was applied. Patient tolerated the procedure well.  Standard post-procedure care and precautions were reviewed. Patient  verbalized understanding.  Lindalou Hose Khaliq Turay, CNM

## 2023-06-30 DIAGNOSIS — Z419 Encounter for procedure for purposes other than remedying health state, unspecified: Secondary | ICD-10-CM | POA: Diagnosis not present

## 2023-07-30 DIAGNOSIS — Z419 Encounter for procedure for purposes other than remedying health state, unspecified: Secondary | ICD-10-CM | POA: Diagnosis not present

## 2023-08-30 DIAGNOSIS — Z419 Encounter for procedure for purposes other than remedying health state, unspecified: Secondary | ICD-10-CM | POA: Diagnosis not present

## 2023-09-29 DIAGNOSIS — Z419 Encounter for procedure for purposes other than remedying health state, unspecified: Secondary | ICD-10-CM | POA: Diagnosis not present

## 2023-10-30 DIAGNOSIS — Z419 Encounter for procedure for purposes other than remedying health state, unspecified: Secondary | ICD-10-CM | POA: Diagnosis not present

## 2023-11-09 ENCOUNTER — Ambulatory Visit: Payer: Medicaid Other | Admitting: Nurse Practitioner

## 2023-11-09 VITALS — BP 118/78 | HR 81 | Temp 98.2°F | Ht 63.0 in | Wt 173.8 lb

## 2023-11-09 DIAGNOSIS — Z1329 Encounter for screening for other suspected endocrine disorder: Secondary | ICD-10-CM

## 2023-11-09 DIAGNOSIS — Z0001 Encounter for general adult medical examination with abnormal findings: Secondary | ICD-10-CM

## 2023-11-09 DIAGNOSIS — E559 Vitamin D deficiency, unspecified: Secondary | ICD-10-CM

## 2023-11-09 DIAGNOSIS — M722 Plantar fascial fibromatosis: Secondary | ICD-10-CM | POA: Diagnosis not present

## 2023-11-09 DIAGNOSIS — Z13220 Encounter for screening for lipoid disorders: Secondary | ICD-10-CM | POA: Diagnosis not present

## 2023-11-09 DIAGNOSIS — J4530 Mild persistent asthma, uncomplicated: Secondary | ICD-10-CM

## 2023-11-09 DIAGNOSIS — E611 Iron deficiency: Secondary | ICD-10-CM

## 2023-11-09 DIAGNOSIS — R102 Pelvic and perineal pain: Secondary | ICD-10-CM

## 2023-11-09 LAB — LIPID PANEL
Cholesterol: 177 mg/dL (ref 0–200)
HDL: 34.2 mg/dL — ABNORMAL LOW (ref >39.00)
LDL Cholesterol: 121 mg/dL — ABNORMAL HIGH (ref 0–99)
NonHDL: 142.92
Total CHOL/HDL Ratio: 5
Triglycerides: 110 mg/dL (ref 0.0–149.0)
VLDL: 22 mg/dL (ref 0.0–40.0)

## 2023-11-09 LAB — CBC WITH DIFFERENTIAL/PLATELET
Basophils Absolute: 0.1 K/uL (ref 0.0–0.1)
Basophils Relative: 1.1 % (ref 0.0–3.0)
Eosinophils Absolute: 0.2 K/uL (ref 0.0–0.7)
Eosinophils Relative: 3.2 % (ref 0.0–5.0)
HCT: 37.5 % (ref 36.0–46.0)
Hemoglobin: 11.7 g/dL — ABNORMAL LOW (ref 12.0–15.0)
Lymphocytes Relative: 36.8 % (ref 12.0–46.0)
Lymphs Abs: 2.2 K/uL (ref 0.7–4.0)
MCHC: 31.2 g/dL (ref 30.0–36.0)
MCV: 72.4 fl — ABNORMAL LOW (ref 78.0–100.0)
Monocytes Absolute: 0.5 K/uL (ref 0.1–1.0)
Monocytes Relative: 7.8 % (ref 3.0–12.0)
Neutro Abs: 3 K/uL (ref 1.4–7.7)
Neutrophils Relative %: 51.1 % (ref 43.0–77.0)
Platelets: 316 K/uL (ref 150.0–400.0)
RBC: 5.18 Mil/uL — ABNORMAL HIGH (ref 3.87–5.11)
RDW: 14.4 % (ref 11.5–15.5)
WBC: 5.9 K/uL (ref 4.0–10.5)

## 2023-11-09 LAB — COMPREHENSIVE METABOLIC PANEL WITH GFR
ALT: 11 U/L (ref 0–35)
AST: 15 U/L (ref 0–37)
Albumin: 4.2 g/dL (ref 3.5–5.2)
Alkaline Phosphatase: 66 U/L (ref 39–117)
BUN: 7 mg/dL (ref 6–23)
CO2: 27 meq/L (ref 19–32)
Calcium: 9.2 mg/dL (ref 8.4–10.5)
Chloride: 105 meq/L (ref 96–112)
Creatinine, Ser: 0.62 mg/dL (ref 0.40–1.20)
GFR: 119.05 mL/min (ref >60.00)
Glucose, Bld: 81 mg/dL (ref 70–99)
Potassium: 4.2 meq/L (ref 3.5–5.1)
Sodium: 140 meq/L (ref 135–145)
Total Bilirubin: 0.4 mg/dL (ref 0.2–1.2)
Total Protein: 7.1 g/dL (ref 6.0–8.3)

## 2023-11-09 LAB — IBC + FERRITIN
Ferritin: 14.9 ng/mL (ref 10.0–291.0)
Iron: 63 ug/dL (ref 42–145)
Saturation Ratios: 16.2 % — ABNORMAL LOW (ref 20.0–50.0)
TIBC: 389.2 ug/dL (ref 250.0–450.0)
Transferrin: 278 mg/dL (ref 212.0–360.0)

## 2023-11-09 LAB — TSH: TSH: 1.38 u[IU]/mL (ref 0.35–5.50)

## 2023-11-09 LAB — VITAMIN D 25 HYDROXY (VIT D DEFICIENCY, FRACTURES): VITD: 16.47 ng/mL — ABNORMAL LOW (ref 30.00–100.00)

## 2023-11-09 MED ORDER — CETIRIZINE HCL 10 MG PO TABS: 10.0000 mg | ORAL_TABLET | Freq: Every day | ORAL | 11 refills | Status: AC

## 2023-11-09 MED ORDER — ALBUTEROL SULFATE HFA 108 (90 BASE) MCG/ACT IN AERS: 2.0000 | INHALATION_SPRAY | Freq: Four times a day (QID) | RESPIRATORY_TRACT | 5 refills | Status: AC | PRN

## 2023-11-09 MED ORDER — IRON (FERROUS SULFATE) 325 (65 FE) MG PO TABS: 1.0000 | ORAL_TABLET | Freq: Every day | ORAL | 3 refills | Status: AC

## 2023-11-09 MED ORDER — BUDESONIDE-FORMOTEROL FUMARATE 80-4.5 MCG/ACT IN AERO: 2.0000 | INHALATION_SPRAY | Freq: Two times a day (BID) | RESPIRATORY_TRACT | 5 refills | Status: AC

## 2023-11-09 NOTE — Progress Notes (Signed)
 Crystal Glance, NP-C Phone: [phone]  Crystal Randall is a 33 y.o. female who presents today for annual exam.   Discussed the use of AI scribe software for clinical note transcription with the patient, who gave verbal consent to proceed.  History of Present Illness   Crystal Randall is a 33 year old female who presents for annual exam with chest pain and cramping.  She experiences chest pain described as a constant tightness, which she associates with her asthma. Her current albuterol  inhaler does not alleviate the symptoms, and she finds relief by controlling her breathing. No shortness of breath is noted.  She experiences cramping associated with her menstrual periods and during intercourse. Her menstrual cycles have become regular since discontinuing Nexplanon  in March, with light bleeding for three days followed by heavier bleeding. She is not using any form of birth control.  She has a history of low iron  and feels sluggish and lacking energy, which her mother attributes to low iron  levels. She continues to take an iron  supplement. She also mentions a previous low vitamin D  level but does not take any vitamin D  supplements.  She quit smoking in April and has not smoked since. She consumes alcohol occasionally, about two weekends a month, and denies any other drug use. She works in a [employer] and experiences sharp pain in the arch of her left foot daily, which she attributes to her work environment.  Her diet includes eggs, bacon, and bananas for breakfast, with home-cooked meals for dinner as she is trying to reduce eating out. She exercises three times a week.  No abnormal bleeding, shortness of breath, abdominal pain, constipation, diarrhea, burning during urination, abnormal discharge, trouble swallowing, skin changes, rashes, swelling in her legs, or mood disorders. She reports a history of headaches and migraines, which have resolved. She experiences increased irritability, which she  attributes to hormonal changes after having children.      Social History   Tobacco Use  Smoking Status Former   Current packs/day: 0.00   Average packs/day: 0.3 packs/day for 2.0 years (0.5 ttl pk-yrs)   Types: Cigarettes   Quit date: 06/19/2023   Years since quitting: 0.4  Smokeless Tobacco Never    No current outpatient medications on file prior to visit.   No current facility-administered medications on file prior to visit.     ROS see history of present illness  Objective  Physical Exam Vitals:   11/22/23 1425  BP: 118/78  Pulse: 81  Temp: 98.2 F (36.8 C)  SpO2: 98%    BP Readings from Last 3 Encounters:  11/22/23 118/78  06/06/23 123/80  04/17/23 120/80   Wt Readings from Last 3 Encounters:  11/22/23 173 lb 12.8 oz (78.8 kg)  06/06/23 179 lb (81.2 kg)  04/17/23 176 lb 12.8 oz (80.2 kg)    Physical Exam Constitutional:      General: She is not in acute distress.    Appearance: Normal appearance.  HENT:     Head: Normocephalic.     Right Ear: Tympanic membrane normal.     Left Ear: Tympanic membrane normal.     Nose: Nose normal.     Mouth/Throat:     Mouth: Mucous membranes are moist.     Pharynx: Oropharynx is clear.  Eyes:     Conjunctiva/sclera: Conjunctivae normal.     Pupils: Pupils are equal, round, and reactive to light.  Neck:     Thyroid: No thyromegaly.  Cardiovascular:  Rate and Rhythm: Normal rate and regular rhythm.     Heart sounds: Normal heart sounds.  Pulmonary:     Effort: Pulmonary effort is normal.     Breath sounds: Normal breath sounds.  Abdominal:     General: Abdomen is flat. Bowel sounds are normal.     Palpations: Abdomen is soft. There is no mass.     Tenderness: There is no abdominal tenderness.  Musculoskeletal:        General: Normal range of motion.  Lymphadenopathy:     Cervical: No cervical adenopathy.  Skin:    General: Skin is warm and dry.     Findings: No rash.  Neurological:     General: No  focal deficit present.     Mental Status: She is alert.  Psychiatric:        Mood and Affect: Mood normal.        Behavior: Behavior normal.      Assessment/Plan: Please see individual problem list.  Encounter for routine adult health examination with abnormal findings Assessment & Plan: Physical exam complete. We will check lab work as outlined. Pap smear is up to date. Declines flu and COVID vaccines. Tetanus vaccine is up to date. Stopped smoking in April, congratulated! Encouraged to continue smoking cessation. Continue routine dental and eye exams. Encourage healthy diet and regular exercise. Return to care in 6 months, sooner as needed.   Orders: -     Comprehensive metabolic panel with GFR  Pelvic pain Assessment & Plan: Intermittent pelvic pain and dyspareunia. Order a pelvic ultrasound for further evaluation.   Orders: -     US  PELVIC COMPLETE WITH TRANSVAGINAL; Future  Mild persistent asthma without complication Assessment & Plan: Chronic asthma presents with chest tightness. Denies shortness of breath and cough. No wheezing noted on exam. Albuterol  rescue inhaler is insufficient, indicating poor control. Start Symbicort  inhaler for daily use and Zyrtec  for daily use. Continue albuterol  as a rescue inhaler as needed. Return precautions given to patient.   Orders: -     Albuterol  Sulfate HFA; Inhale 2 puffs into the lungs every 6 (six) hours as needed for wheezing or shortness of breath.  Dispense: 8.5 g; Refill: 5 -     Cetirizine  HCl; Take 1 tablet (10 mg total) by mouth daily.  Dispense: 30 tablet; Refill: 11 -     Budesonide -Formoterol  Fumarate; Inhale 2 puffs into the lungs 2 (two) times daily.  Dispense: 10.2 g; Refill: 5  Plantar fasciitis of left foot Assessment & Plan: Chronic left foot pain, likely plantar fasciitis. Provide exercises for plantar fasciitis and recommend supportive shoes. Suggest using a frozen water bottle or tennis ball to roll the foot.  Consider referral to podiatry if symptoms do not improve.   Iron  deficiency Assessment & Plan: Iron  deficiency with fatigue and low energy persists. Likely due to heavy menstrual cycles. Menstrual cycles are regular but with alternating heavy and light flow since Nexplanon  removal. Monitor menstrual cycle changes. Order blood tests to check iron  levels and encourage continued iron  supplementation.  Orders: -     CBC with Differential/Platelet -     IBC + Ferritin -     Iron  (Ferrous Sulfate ); Take 1 tablet by mouth daily.  Dispense: 90 tablet; Refill: 3  Vitamin D  deficiency Assessment & Plan: Not currently taking a supplement. Check vitamin D  level.   Orders: -     VITAMIN D  25 Hydroxy (Vit-D Deficiency, Fractures)  Lipid screening -  Lipid panel  Thyroid disorder screen -     TSH     Return in about 6 months (around 05/11/2024) for Follow up.   Crystal Glance, NP-C  Primary Care - [facility]

## 2023-11-14 ENCOUNTER — Ambulatory Visit: Payer: Self-pay | Admitting: Nurse Practitioner

## 2023-11-15 ENCOUNTER — Other Ambulatory Visit: Payer: Self-pay | Admitting: Nurse Practitioner

## 2023-11-15 DIAGNOSIS — E559 Vitamin D deficiency, unspecified: Secondary | ICD-10-CM

## 2023-11-15 MED ORDER — VITAMIN D (ERGOCALCIFEROL) 1.25 MG (50000 UNIT) PO CAPS: 50000.0000 [IU] | ORAL_CAPSULE | ORAL | 1 refills | Status: AC

## 2023-11-15 NOTE — Progress Notes (Signed)
 2nd attempt to contact pt vm left to CB    E2C2 IT IS OKAY TO RELAY MESSAGE

## 2023-11-22 ENCOUNTER — Encounter: Payer: Self-pay | Admitting: Nurse Practitioner

## 2023-11-22 DIAGNOSIS — Z0001 Encounter for general adult medical examination with abnormal findings: Secondary | ICD-10-CM | POA: Insufficient documentation

## 2023-11-22 NOTE — Assessment & Plan Note (Signed)
 Iron  deficiency with fatigue and low energy persists. Likely due to heavy menstrual cycles. Menstrual cycles are regular but with alternating heavy and light flow since Nexplanon  removal. Monitor menstrual cycle changes. Order blood tests to check iron  levels and encourage continued iron  supplementation.

## 2023-11-22 NOTE — Assessment & Plan Note (Signed)
 Not currently taking a supplement. Check vitamin D  level.

## 2023-11-22 NOTE — Assessment & Plan Note (Signed)
 Chronic left foot pain, likely plantar fasciitis. Provide exercises for plantar fasciitis and recommend supportive shoes. Suggest using a frozen water bottle or tennis ball to roll the foot. Consider referral to podiatry if symptoms do not improve.

## 2023-11-22 NOTE — Assessment & Plan Note (Signed)
 Intermittent pelvic pain and dyspareunia. Order a pelvic ultrasound for further evaluation.

## 2023-11-22 NOTE — Assessment & Plan Note (Signed)
 Physical exam complete. We will check lab work as outlined. Pap smear is up to date. Declines flu and COVID vaccines. Tetanus vaccine is up to date. Stopped smoking in April, congratulated! Encouraged to continue smoking cessation. Continue routine dental and eye exams. Encourage healthy diet and regular exercise. Return to care in 6 months, sooner as needed.

## 2023-11-22 NOTE — Assessment & Plan Note (Addendum)
 Chronic asthma presents with chest tightness. Denies shortness of breath and cough. No wheezing noted on exam. Albuterol  rescue inhaler is insufficient, indicating poor control. Start Symbicort  inhaler for daily use and Zyrtec  for daily use. Continue albuterol  as a rescue inhaler as needed. Return precautions given to patient.

## 2023-11-30 DIAGNOSIS — Z419 Encounter for procedure for purposes other than remedying health state, unspecified: Secondary | ICD-10-CM | POA: Diagnosis not present

## 2024-02-29 DIAGNOSIS — Z419 Encounter for procedure for purposes other than remedying health state, unspecified: Secondary | ICD-10-CM | POA: Diagnosis not present

## 2024-03-11 ENCOUNTER — Emergency Department

## 2024-03-11 ENCOUNTER — Emergency Department
Admission: EM | Admit: 2024-03-11 | Discharge: 2024-03-11 | Disposition: A | Discharge status: Home / Self Care | Attending: Emergency Medicine | Admitting: Emergency Medicine

## 2024-03-11 ENCOUNTER — Other Ambulatory Visit: Payer: Self-pay

## 2024-03-11 DIAGNOSIS — O26891 Other specified pregnancy related conditions, first trimester: Secondary | ICD-10-CM | POA: Diagnosis not present

## 2024-03-11 DIAGNOSIS — R42 Dizziness and giddiness: Secondary | ICD-10-CM | POA: Diagnosis not present

## 2024-03-11 DIAGNOSIS — Z3A00 Weeks of gestation of pregnancy not specified: Secondary | ICD-10-CM | POA: Diagnosis not present

## 2024-03-11 DIAGNOSIS — O209 Hemorrhage in early pregnancy, unspecified: Secondary | ICD-10-CM | POA: Insufficient documentation

## 2024-03-11 DIAGNOSIS — Z3A11 11 weeks gestation of pregnancy: Secondary | ICD-10-CM | POA: Diagnosis not present

## 2024-03-11 DIAGNOSIS — Z3A01 Less than 8 weeks gestation of pregnancy: Secondary | ICD-10-CM | POA: Diagnosis not present

## 2024-03-11 DIAGNOSIS — M545 Low back pain, unspecified: Secondary | ICD-10-CM | POA: Insufficient documentation

## 2024-03-11 DIAGNOSIS — O2300 Infections of kidney in pregnancy, unspecified trimester: Secondary | ICD-10-CM | POA: Diagnosis not present

## 2024-03-11 DIAGNOSIS — N939 Abnormal uterine and vaginal bleeding, unspecified: Secondary | ICD-10-CM

## 2024-03-11 DIAGNOSIS — R109 Unspecified abdominal pain: Secondary | ICD-10-CM

## 2024-03-11 DIAGNOSIS — R1030 Lower abdominal pain, unspecified: Secondary | ICD-10-CM | POA: Diagnosis not present

## 2024-03-11 DIAGNOSIS — Z87891 Personal history of nicotine dependence: Secondary | ICD-10-CM | POA: Diagnosis not present

## 2024-03-11 LAB — CBC WITH DIFFERENTIAL/PLATELET
Abs Immature Granulocytes: 0.04 K/uL (ref 0.00–0.07)
Basophils Absolute: 0.1 K/uL (ref 0.0–0.1)
Basophils Relative: 1 %
Eosinophils Absolute: 0.2 K/uL (ref 0.0–0.5)
Eosinophils Relative: 2 %
HCT: 36 % (ref 36.0–46.0)
Hemoglobin: 11.4 g/dL — ABNORMAL LOW (ref 12.0–15.0)
Immature Granulocytes: 1 %
Lymphocytes Relative: 32 %
Lymphs Abs: 2.6 K/uL (ref 0.7–4.0)
MCH: 23.1 pg — ABNORMAL LOW (ref 26.0–34.0)
MCHC: 31.7 g/dL (ref 30.0–36.0)
MCV: 73 fL — ABNORMAL LOW (ref 80.0–100.0)
Monocytes Absolute: 0.7 K/uL (ref 0.1–1.0)
Monocytes Relative: 8 %
Neutro Abs: 4.6 K/uL (ref 1.7–7.7)
Neutrophils Relative %: 56 %
Platelets: 322 K/uL (ref 150–400)
RBC: 4.93 MIL/uL (ref 3.87–5.11)
RDW: 14.5 % (ref 11.5–15.5)
WBC: 8.2 K/uL (ref 4.0–10.5)
nRBC: 0 % (ref 0.0–0.2)

## 2024-03-11 LAB — HCG, QUANTITATIVE, PREGNANCY: hCG, Beta Chain, Quant, S: 25 m[IU]/mL — ABNORMAL HIGH

## 2024-03-11 NOTE — ED Triage Notes (Addendum)
 Pt to ED via POV for back pain and vaginal bleeding. Pt reports she found out she was pregnant on 12/19. Pt reports cramping in lower abdomen. Pt reports last menstrual period 11/14. Pt reports bleeding began this morning but has gotten heavier throughout the day. Pt reports 1 pad per hour.

## 2024-03-11 NOTE — ED Notes (Signed)
 Pt departed prior to receiving discharge instructions. Unable to complete departing vitals.

## 2024-03-11 NOTE — ED Provider Notes (Signed)
 [facility] Provider Note    Event Date/Time   First MD Initiated Contact with Patient 03/11/24 1930     (approximate)   History   Back Pain and Vaginal Bleeding   HPI  Crystal Randall is a 33 y.o. female with history of asthma, G4, P2 presenting with vaginal bleeding.  Patient states that she took a pregnancy test on 19 December and found to be positive.  Last menstrual period was on 14 November.  Has been having some lower abdominal cramping as well as vaginal bleeding.  States gotten worse throughout the day.  Was going through 1 pad per hour.  States that she has some back aching as well as some slight lightheadedness.  No chest pain or shortness of breath, no nausea vomiting or diarrhea.  Thinks that she might be having a miscarriage.  States that she has switched out 2 pads since being here, they are not saturated.  Bleeding is slowing at this point.  On independent chart review, she was last seen by OB in March, at that time for removal of her Nexplanon .  Did not desire any additional contraception at that time.  On independent review, she is B+.     Physical Exam   Triage Vital Signs: ED Triage Vitals  Encounter Vitals Group     BP 03/11/24 1653 (!) 142/92     Girls Systolic BP Percentile --      Girls Diastolic BP Percentile --      Boys Systolic BP Percentile --      Boys Diastolic BP Percentile --      Pulse Rate 03/11/24 1653 90     Resp 03/11/24 1653 18     Temp 03/11/24 1653 98.4 F (36.9 C)     Temp src --      SpO2 03/11/24 1653 100 %     Weight 03/11/24 1654 175 lb (79.4 kg)     Height 03/11/24 1654 5' 3 (1.6 m)     Head Circumference --      Peak Flow --      Pain Score 03/11/24 1654 5     Pain Loc --      Pain Education --      Exclude from Growth Chart --     Most recent vital signs: Vitals:   03/11/24 1653 03/11/24 2034  BP: (!) 142/92 120/80  Pulse: 90 86  Resp: 18 19  Temp: 98.4 F (36.9 C)   SpO2: 100% 97%      General: Awake, no distress.  CV:  Good peripheral perfusion.  Resp:  Normal effort.  Abd:  No distention.  Soft nontender Other:  Ambulatory, nontoxic-appearing   ED Results / Procedures / Treatments   Labs (all labs ordered are listed, but only abnormal results are displayed) Labs Reviewed  CBC WITH DIFFERENTIAL/PLATELET - Abnormal; Notable for the following components:      Result Value   Hemoglobin 11.4 (*)    MCV 73.0 (*)    MCH 23.1 (*)    All other components within normal limits  HCG, QUANTITATIVE, PREGNANCY - Abnormal; Notable for the following components:   hCG, Beta Chain, Quant, S 25 (*)    All other components within normal limits     RADIOLOGY On my independent interpretation, ultrasound without confirmed IUP   PROCEDURES:  Critical Care performed: No  Procedures   MEDICATIONS ORDERED IN ED: Medications - No data to display   IMPRESSION /  MDM / ASSESSMENT AND PLAN / ED COURSE  I reviewed the triage vital signs and the nursing notes.                              Differential diagnosis includes, but is not limited to, miscarriage, early pregnancy, ectopic, anemia.  Will get labs, ultrasound.  Patient's presentation is most consistent with acute presentation with potential threat to life or bodily function.  Independent interpretation of labs and imaging below.  Patient's bleeding is improving.  Ultrasound without confirm IUP.  Discussed with her about following up with OB to get repeat hCG levels in 2 days.  If she is unable to, she should come back to the emergency department to get repeat labs.  Otherwise considered but no indication for inpatient admission at this time, she safe for outpatient management.  Will discharge with strict return precautions.   Clinical Course as of 03/11/24 2122  Tue Mar 11, 2024  2001 Independent review of labs, electrolytes are severely deranged, H&H is stable, Hcg is 25. [TT]  2118 US  OB LESS THAN 14 WEEKS  WITH OB TRANSVAGINAL IMPRESSION: 1. Probable early intrauterine gestational sac, but no yolk sac, fetal pole, or cardiac activity yet visualized. Recommend follow-up quantitative B-HCG levels and follow-up US  in 14 days to assess viability. This recommendation follows SRU consensus guidelines: Diagnostic Criteria for Nonviable Pregnancy Early in the First Trimester. LOISE Diedra PARAS Med 2013; 630:8556-48.   [TT]    Clinical Course User Index [TT] Waymond, Lorelle Cummins, MD     FINAL CLINICAL IMPRESSION(S) / ED DIAGNOSES   Final diagnoses:  Vaginal bleeding  Acute bilateral low back pain without sciatica  Abdominal cramping     Rx / DC Orders   ED Discharge Orders     None        Note:  This document was prepared using Dragon voice recognition software and may include unintentional dictation errors.    Waymond Lorelle Cummins, MD 03/11/24 [phone]

## 2024-03-11 NOTE — Discharge Instructions (Addendum)
 Please make sure to follow-up with OB/GYN in 2 days to get repeat hCG levels done.  If you are unable to follow-up with OB or primary care, please come at the emergency department to get repeat labs.  Your ultrasound shows an early gestational sac without a yolk sac, fetal pole or cardiac TB, it does not show a confirmed intrauterine pregnancy at this time.  Please also return if you have worsening pain, if you are saturating 2 pads per hour, if you pass out, or if you are very lightheaded, or if you have any additional concerns.

## 2024-03-12 DIAGNOSIS — Z87891 Personal history of nicotine dependence: Secondary | ICD-10-CM | POA: Diagnosis not present

## 2024-03-12 DIAGNOSIS — Z3A00 Weeks of gestation of pregnancy not specified: Secondary | ICD-10-CM | POA: Diagnosis not present

## 2024-03-12 DIAGNOSIS — O2300 Infections of kidney in pregnancy, unspecified trimester: Secondary | ICD-10-CM | POA: Diagnosis not present

## 2024-05-13 ENCOUNTER — Ambulatory Visit: Admitting: Nurse Practitioner
